# Patient Record
Sex: Male | Born: 1950
Health system: Southern US, Community
[De-identification: ages and names within clinical notes are randomized; demographics above are authoritative.]

## PROBLEM LIST (undated history)

## (undated) ENCOUNTER — Emergency Department (HOSPITAL_COMMUNITY): Payer: Medicare HMO | Source: Home / Self Care

## (undated) DIAGNOSIS — M51369 Other intervertebral disc degeneration, lumbar region without mention of lumbar back pain or lower extremity pain: Secondary | ICD-10-CM

## (undated) DIAGNOSIS — S065XAA Traumatic subdural hemorrhage with loss of consciousness status unknown, initial encounter: Secondary | ICD-10-CM

## (undated) DIAGNOSIS — C61 Malignant neoplasm of prostate: Secondary | ICD-10-CM

## (undated) DIAGNOSIS — L300 Nummular dermatitis: Secondary | ICD-10-CM

## (undated) DIAGNOSIS — S065X9A Traumatic subdural hemorrhage with loss of consciousness of unspecified duration, initial encounter: Secondary | ICD-10-CM

## (undated) DIAGNOSIS — C801 Malignant (primary) neoplasm, unspecified: Secondary | ICD-10-CM

## (undated) DIAGNOSIS — E663 Overweight: Secondary | ICD-10-CM

## (undated) DIAGNOSIS — R55 Syncope and collapse: Secondary | ICD-10-CM

## (undated) DIAGNOSIS — M5136 Other intervertebral disc degeneration, lumbar region: Secondary | ICD-10-CM

## (undated) DIAGNOSIS — Z95810 Presence of automatic (implantable) cardiac defibrillator: Secondary | ICD-10-CM

## (undated) DIAGNOSIS — E785 Hyperlipidemia, unspecified: Secondary | ICD-10-CM

## (undated) DIAGNOSIS — R569 Unspecified convulsions: Secondary | ICD-10-CM

## (undated) HISTORY — PX: VASECTOMY: SHX75

## (undated) HISTORY — DX: Syncope and collapse: R55

## (undated) HISTORY — DX: Other intervertebral disc degeneration, lumbar region without mention of lumbar back pain or lower extremity pain: M51.369

## (undated) HISTORY — DX: Nummular dermatitis: L30.0

## (undated) HISTORY — PX: NASAL SEPTUM SURGERY: SHX37

## (undated) HISTORY — PX: COLONOSCOPY: SHX174

## (undated) HISTORY — PX: OTHER SURGICAL HISTORY: SHX169

## (undated) HISTORY — DX: Hyperlipidemia, unspecified: E78.5

## (undated) HISTORY — DX: Other intervertebral disc degeneration, lumbar region: M51.36

---

## 2000-11-10 ENCOUNTER — Encounter: Admission: RE | Admit: 2000-11-10 | Discharge: 2000-11-10 | Payer: Self-pay | Admitting: Otolaryngology

## 2000-11-10 ENCOUNTER — Encounter: Payer: Self-pay | Admitting: Otolaryngology

## 2000-11-30 ENCOUNTER — Encounter (INDEPENDENT_AMBULATORY_CARE_PROVIDER_SITE_OTHER): Payer: Self-pay | Admitting: Specialist

## 2000-11-30 ENCOUNTER — Other Ambulatory Visit: Admission: RE | Admit: 2000-11-30 | Discharge: 2000-11-30 | Payer: Self-pay | Admitting: Otolaryngology

## 2003-02-03 ENCOUNTER — Ambulatory Visit (HOSPITAL_COMMUNITY): Admission: RE | Admit: 2003-02-03 | Discharge: 2003-02-03 | Payer: Self-pay | Admitting: Internal Medicine

## 2003-03-13 ENCOUNTER — Inpatient Hospital Stay (HOSPITAL_COMMUNITY): Admission: RE | Admit: 2003-03-13 | Discharge: 2003-03-15 | Payer: Self-pay | Admitting: Neurosurgery

## 2004-02-19 ENCOUNTER — Ambulatory Visit (HOSPITAL_COMMUNITY): Admission: RE | Admit: 2004-02-19 | Discharge: 2004-02-19 | Payer: Self-pay | Admitting: Gastroenterology

## 2004-02-19 ENCOUNTER — Encounter (INDEPENDENT_AMBULATORY_CARE_PROVIDER_SITE_OTHER): Payer: Self-pay | Admitting: Specialist

## 2008-03-14 ENCOUNTER — Encounter: Admission: RE | Admit: 2008-03-14 | Discharge: 2008-03-14 | Payer: Self-pay | Admitting: Otolaryngology

## 2010-07-02 NOTE — H&P (Signed)
NAME:  Anthony Banks, Anthony Banks                          ACCOUNT NO.:  [id number]   MEDICAL RECORD NO.:  [id number]                   PATIENT TYPE:  INP   LOCATION:  3172                                 FACILITY:  [facility]   PHYSICIAN:  Hewitt Shorts, M.D.            DATE OF BIRTH:  06/06/1950   DATE OF ADMISSION:  03/13/2003  DATE OF DISCHARGE:                                HISTORY & PHYSICAL   HISTORY OF PRESENT ILLNESS:  The patient is a 60 year old right-handed white  male whom I evaluated one month ago for lumbar radiculopathy secondary to  lumbar disk herniation.  His difficulties began two months ago.  He was in a  golf tournament and developed some burning in his low back that went over  his buttocks.  He was at a wedding in early December and dancing and other  activities aggravated the pain.  He developed shooting pain into the medial  aspect of his right thigh.  He was evaluated with x-rays and MRI scan, and a  neurosurgical consultation was requested.  When he was evaluated one month  ago, his primary complaint was that of left buttock and posterior thigh pain  which was aggravated by coughing and sneezing, as well as some morning  stiffness.  He did not described any numbness, paresthesia, or weakness.   PAST MEDICAL HISTORY:  He does not describe any history of hypertension,  myocardial infarction, cancer, stroke, diabetes, cardiovascular disease, or  lung disease.  Surgeries include surgery for a deviated septum in 2003 and  blepharoplasty five years ago.   He reported an allergy to ASPIRIN which causes hives and chest pain.   Current medication includes Lodine b.i.d.   FAMILY HISTORY:  His mother is age 11 and in good health.  His father is age  65 and in good health.   SOCIAL HISTORY:  The patient is an Surveyor, minerals.  He is married.  He does not smoke.  He does drink alcoholic beverages socially.  He denies  history of substance abuse.   REVIEW OF SYSTEMS:   Notable for those in the history of present illness and  past medical history but is otherwise unremarkable.   PHYSICAL EXAMINATION:  GENERAL:  The patient is a well-developed, well-  nourished white male in no acute distress.  VITAL SIGNS:  Temperature 97.3, pulse 67, blood pressure 134/86, respiratory  rate 16.  Height 6 foot 7 inches, weight 237 pounds.  LUNGS:  Clear to auscultation with symmetrical respiratory excursion.  HEART:  Regular rate and rhythm, S1 and S2, no murmur.  ABDOMEN:  Soft, nondistended.  Bowel sounds are present.  EXTREMITIES:  No clubbing, cyanosis, or edema.  MUSCULOSKELETAL:  Examination shows no tenderness to palpation over the  lumbar spinous processes or paralumbar musculature.  NEUROLOGIC:  Examination shows 5/5 strength in the lower extremities  including iliopsoas, quadriceps, dorsal flexors, plantar flexors and  __________  bilaterally.  Sensation is intact to pinprick in the distal  lower extremities.  Reflexes are minimal in the biceps and in the right  gastrocnemius is 1-2.  Toes on downgoing bilaterally.  He has a normal gait  and stance.   DIAGNOSTIC STUDIES:  X-rays show multilevel degenerative disk disease and  spondylosis with disk space narrowing L3-4, L4-5, and L5-S1.  There is no  significant spurring and grade 1 retrolisthesis at L3 and L4 which is stable  with flexion and extension.  Oblique films showed moderate facet arthropathy  bilaterally in multiple levels.   MRI shows multilevel degenerative disk disease and spondylosis.  There is  marked disk space narrowing at L4-L5 and L5-S1 associated with foraminal  encroachments due to loss of disk space height, but no significant disk  protrusion at those levels.  At L4-5 there is mild stenosis.  No stenosis at  the L5-S1 level.  At L1-2 and L2-3, there is degenerative disk disease and  again mild stenosis of L2-3 and no stenosis at L1-2.   The most significant finding is the degenerative  changes seen at the L3-4  level with a grade 1 retrolisthesis at L3 and 4, significant disk bulging  ventrally and dorsally at L3-4 with a large disk herniation for fragments  extending bilaterally at L3-4, worse to the right than to the left.  Another  significant finding is that of stenosis at the L3-4 level due to a  combination of retrolisthesis, degenerative disk protrusion and disk  herniation.   IMPRESSION:  A patient with back pain and lumbar radiculopathy, at time  bilaterally and other times only to the right, currently only to the left.  He is neurologically intact.  He has multilevel degenerative disk disease  and spondylosis with a large disk herniation at L3-4 superimposed upon  significant degenerative disk protrusion with resulting marked, severe  stenosis at the L3-4 level.  There is retrolisthesis at L3 on L4.   PLAN:  We have discussed a number of options which include symptomatic  treatment versus surgical intervention and elected to proceed with L4 Gill  procedure, L3 laminectomy, L3-4 posterior lumbar interbody fusion and  interbody PEEK cages and bone graft and posterolateral arthrodesis with  posterior instrumentation and bone graft.  I have discussed the nature of  this procedure as well as other procedures, as well as the alternative of  continued symptomatic treatment with the patient.  We discussed the nature  of the procedures, __________  , hospital standard __________  , surgery and  risks of surgery including the risk of infection, bleeding, possibility of  transfusion, the risk of nerve dysfunction and pain, weakness, or  paresthesias, the risk of dural tear and CSF leak, possible need for further  surgery, the risk of failure of the arthrodesis and possible need for  further surgery, and as to the myocardial infarction, stroke, pneumonia, and death . Understanding all this, he does wish to go ahead with surgery and is  admitted for such.                                                 Hewitt Shorts, M.D.    RWN/MEDQ  D:  03/13/2003  T:  03/13/2003  Job:  [id number]

## 2010-07-02 NOTE — Op Note (Signed)
NAME:  Anthony Banks, Anthony Banks                          ACCOUNT NO.:  [id number]   MEDICAL RECORD NO.:  [id number]                   PATIENT TYPE:  INP   LOCATION:  3012                                 FACILITY:  [facility]   PHYSICIAN:  Hewitt Shorts, M.D.            DATE OF BIRTH:  Feb 05, 1951   DATE OF PROCEDURE:  03/13/2003  DATE OF DISCHARGE:                                 OPERATIVE REPORT   PREOPERATIVE DIAGNOSIS:  L3-L4 lumbar disc herniation, lumbar stenosis,  lumbar spondylosis, and lumbar degenerative disc disease.   POSTOPERATIVE DIAGNOSIS:  L3-L4 lumbar disc herniation, lumbar stenosis,  lumbar spondylosis, and lumbar degenerative disc disease.   PROCEDURE:  L3 Gill procedure, L3-L4 posterior lumbar interbody fusion with  Globus PEEK implants with VTOS and bone marrow aspirate and L3-L4  posterolateral arthrodesis with 90D instrumentation, VTOS with bone marrow  aspirate, and locally harvested morselized autograft with microdissection.   SURGEON:  Hewitt Shorts, M.D.   ASSISTANT:  Clydene Fake, M.D.   ANESTHESIA:  General endotracheal anesthesia.   INDICATIONS FOR PROCEDURE:  This is a 60 year old man who presented with low  back pain and bilateral lumbar radiculopathy who was found to have a  degenerative retrolisthesis L3 on L4 with degenerative disc disease and  spondylosis at the L3-L4 level and with a bilobed L3-L4 disc herniation that  extended caudally behind the body of L4.  There was severe stenosis at the  level and the decision was made to proceed with decompression and  arthrodesis.   DESCRIPTION OF PROCEDURE:  The patient was brought to the operating room and  placed under general endotracheal anesthesia.  The patient was turned to the  prone position and the lumbar region was prepped with Betadine soaping  solution and draped in a sterile fashion.  The midline was infiltrated with  local anesthetic with epinephrine.  An x-ray was taken and the L3-L4  level  identified and a midline incision was made over the L3-L4 level, the line of  the incision was infiltrated with local anesthetic with epinephrine.  The  dissection was carried down through the subcutaneous tissue.  Bipolar  cautery and electrocautery were used to maintain hemostasis.  Dissection was  carried down to the lumbar fascia which was incised bilaterally.  The  paraspinal muscles were dissected from the spinous process and lamina in a  subperiosteal fashion.  The L3-L4 interlaminar space was identified and an x-  ray was taken to confirm the localization.  We proceeded with L3 Gill  procedure removing the spinous processes, lamina, and inferior articular  processes of L3 at the level of the pars intra-articularis.  A superior L4  laminotomy was performed bilaterally.  The microscope was then draped and  brought onto the field to provide magnification, illumination, and  visualization, and the remainder of the decompression was performed using  microdissection and microsurgical technique.  The ligamentum flavum was  carefully removed and we were able to identify the L3 and L4 nerve roots  bilaterally and these structures were decompressed.  We then examined the  epidural space.  There was a large broad based disc herniation at the L3-L4  level, however, caudal to the actual disc space level, we encountered free  fragment and disc herniation within the ventral epidural space along the  superior aspect of the L4 vertebral body.  This was carefully dissected from  the surrounding epidural tissues and removed.  We then entered into the disc  space bilaterally incising the annulus and using pituitary rongeurs and a  variety of microcurets, proceeded with a thorough discectomy bilaterally.  The cartilaginous endplates of the vertebral body surface were cleared of  the cartilaginous tissue to expose bony surface for the preparation of the  arthrodesis.  Once the decompression was  completed, we used gentle  distractor to open the disc space slightly and we sized an 11 mm interbody  implant.  We then probed the pedicles of L4 bilaterally and aspirated bone  marrow aspirate which was injected over two 10 mL chips of VTOS.  The VTOS  was then packed into the 11 mm PEEK interbody cages and then the interbody  spacers were removed and the interbody implants packed with VTOS with bone  marrow aspirate and gently positioned into the intervertebral disc space  bilaterally.  We then took additional VTOS with bone marrow aspirate and  packed it lateral to the interbody implants.  It was tapped into position  and densely packed.   We then proceeded with the posterolateral arthrodesis.  C-arm fluoroscopic  unit was brought onto the field.  We identified the pedicle entry sites for  L3 bilaterally.  The overlying cortex was opened with aw [facility] Max drill and  with C-arm fluoroscopic guidance, the pedicles at L3 were probed.  We then  examined each of the four pedicles with a ball probe.  No cut outs were  found.  All four pedicles were then tapped with a 5.25 tap.  Again, it was  examined with a ball probe and, again, no cut outs were found and good  threading was noted.  We placed 6.75 by 50 mm screws bilaterally at L3 and  L4, again, with C-arm fluoroscopic guidance.  Once all four pedicle screws  were in position, we selected 40 mm rods which were placed on the screw  heads and locking caps were placed and then tightened against the counter  torque bilaterally.  We then selected a medium size crosslink which was  secured to the rods bilaterally and tightened and a solid construct had been  created.  We then had, earlier in the exposure, exposed the transverse  processes of L3 and L4 bilaterally and decorticated their surfaces.  We took  additional VTOS with bone marrow aspirate and packed it over the transverse processes and inter-transverse gutter and took additional locally  harvested  morselized autograft and packed it over the VTOS.  The C-arm fluoroscopic  unit showed good positioning of the pedicle screws from the lateral  trajectory as well as good trapezoidal trajectories on AP view.  Once the  bone graft had been placed laterally, we checked again to insure that none  of the bone graft was against the thecal sac or nerve roots.  Then, we  proceeded with closure.  The wound had been extensively irrigated throughout  the procedure with saline solution as well as Bacitracin solution.  The  paraspinal muscles were approximated with interrupted undyed #1 Vicryl  suture, the deep fascia was closed with interrupted undyed #1 Vicryl suture,  the subcutaneous and subcuticular layer was closed with interrupted inverted  2-0 Vicryl sutures, and the skin was reapproximated with Dermabond.  The  patient tolerated the procedure well.  Estimated blood loss was 600 mL.  We  were able to give back to the patient 200 mL of Cell Saver blood.  Sponge  and needle counts were correct.  Following surgery, the patient was turned  back to the supine position, reversed from the anesthetic, extubated, and  transferred to the recovery room for further care.                                               Hewitt Shorts, M.D.    RWN/MEDQ  D:  03/13/2003  T:  03/13/2003  Job:  [id number]

## 2010-07-02 NOTE — Op Note (Signed)
NAME:  ELIZA, GRISSINGER                ACCOUNT NO.:  [id number]   MEDICAL RECORD NO.:  [id number]          PATIENT TYPE:  AMB   LOCATION:  ENDO                         FACILITY:  [facility]   PHYSICIAN:  Danise Edge, M.D.   DATE OF BIRTH:  18-Jan-1951   DATE OF PROCEDURE:  02/19/2004  DATE OF DISCHARGE:                                 OPERATIVE REPORT   PROCEDURE PERFORMED:  Colonoscopy and sigmoid polypectomy.   PROCEDURE INDICATION:  Mr. Ori Trejos is a 60 year old male, born  1950-10-18.  Mr. Tomaro is scheduled to undergo his first screening  colonoscopy with polypectomy to prevent colon cancer.   ENDOSCOPIST:  Danise Edge, M.D.   PREMEDICATION:  Versed 7.5 mg, Demerol 70 mg.   PROCEDURE:  After obtaining informed consent, Mr. Woods was placed in the  left lateral decubitus position.  I administered intravenous Demerol and  intravenous Versed to achieve conscious sedation for the procedure.  The  patient's blood pressure, oxygen saturation, and cardiac rhythm were  monitored throughout the procedure and documented in the medical record.   Anal inspection and digital rectal exam were normal.  The prostate was non-  nodular.  The  Olympus adjustable pediatric colonoscope was introduced into  the rectum and advanced to the cecum.  Colonic preparation for the exam  today was excellent.   Rectum:  Normal.   Sigmoid colon and descending colon:  From the distal sigmoid colon at 20 cm  from the anal verge, a 2-mm sessile polyp was removed with the  electrocautery snare.   Splenic flexure:  Normal.   Transverse colon:  Normal.   Hepatic flexure:  Normal.   Ascending colon:  Normal.   Cecum and ileocecal valve:  Normal.   ASSESSMENT:  A small polyp was removed from the distal sigmoid colon;  otherwise normal screening proctocolonoscopy to the cecum.      MJ/MEDQ  D:  02/19/2004  T:  02/19/2004  Job:  [id number]   cc:   Thora Lance, M.D.  [address]  [place]  [place] [address]  Fax: [phone]

## 2011-03-20 ENCOUNTER — Emergency Department (HOSPITAL_COMMUNITY)
Admission: EM | Admit: 2011-03-20 | Discharge: 2011-03-20 | Disposition: A | Discharge status: Home / Self Care | Payer: 59 | Attending: Emergency Medicine | Admitting: Emergency Medicine

## 2011-03-20 ENCOUNTER — Encounter (HOSPITAL_COMMUNITY): Payer: Self-pay

## 2011-03-20 DIAGNOSIS — S025XXA Fracture of tooth (traumatic), initial encounter for closed fracture: Secondary | ICD-10-CM | POA: Insufficient documentation

## 2011-03-20 DIAGNOSIS — S0100XA Unspecified open wound of scalp, initial encounter: Secondary | ICD-10-CM | POA: Insufficient documentation

## 2011-03-20 DIAGNOSIS — IMO0002 Reserved for concepts with insufficient information to code with codable children: Secondary | ICD-10-CM | POA: Insufficient documentation

## 2011-03-20 DIAGNOSIS — S0101XA Laceration without foreign body of scalp, initial encounter: Secondary | ICD-10-CM

## 2011-03-20 NOTE — ED Provider Notes (Signed)
History     CSN: [id number]  Arrival date & time 03/20/11  1504   First MD Initiated Contact with Patient 03/20/11 1539      Chief Complaint  Patient presents with  . Head Laceration     Patient is a 61 y.o. male presenting with scalp laceration. The history is provided by the patient.  Head Laceration This is a new problem. The current episode started less than 1 hour ago. The problem occurs constantly. The problem has not changed since onset.Pertinent negatives include no chest pain, no abdominal pain, no headaches and no shortness of breath. The symptoms are aggravated by nothing. The symptoms are relieved by nothing.  pt was helping friend move a tree and part of the tree fell as he was moving the tree and hit him on the top of his head.   He was not crushed by tree.   No LOC No headache No falls No neck/back pain No focal weakness/dizziness No cp/sob/abd pain Reports he may have "Cracked" one of his teeth biting down No visual changes Tetanus UTD  PMH - none  History reviewed. No pertinent past surgical history.  History reviewed. No pertinent family history.  History  Substance Use Topics  . Smoking status: Never Smoker   . Smokeless tobacco: Not on file  . Alcohol Use: Yes      Review of Systems  Respiratory: Negative for shortness of breath.   Cardiovascular: Negative for chest pain.  Gastrointestinal: Negative for abdominal pain.  Neurological: Negative for headaches.  All other systems reviewed and are negative.    Allergies  Aspirin  Home Medications  No current outpatient prescriptions on file.  BP 155/98  Pulse 82  Temp(Src) 98.4 F (36.9 C) (Oral)  Resp 20  SpO2 96%  Physical Exam CONSTITUTIONAL: Well developed/well nourished HEAD AND FACE: 4cm laceration to top of head, it is linear and extends vertically.  Bleeding controlled The periosteum is not exposed. Galeal intact EYES: EOMI/PERRL ENMT: Mucous membranes moist. Small ellis  class 1 fx of left upper central incisor.  No other dental injury.  No malocclusion.  No trismus.  Midface stable.  No nasal deformity/tenderness. No septa hematoma.   NECK: supple no meningeal signs SPINE:entire spine nontender, NEXUS criteria met, No bruising/crepitance/stepoffs noted to spine CV: S1/S2 noted, no murmurs/rubs/gallops noted LUNGS: Lungs are clear to auscultation bilaterally, no apparent distress ABDOMEN: soft, nontender, no rebound or guarding GU:no cva tenderness NEURO: Pt is awake/alert, moves all extremitiesx4, GCS 15 EXTREMITIES: pulses normal, full ROM SKIN: warm, color normal PSYCH: no abnormalities of mood noted  ED Course  Procedures   LACERATION REPAIR Performed by: Joya Gaskins Consent: Verbal consent obtained. Risks and benefits: risks, benefits and alternatives were discussed Patient identity confirmed: provided demographic data  Prepped and Draped in normal sterile fashion Wound explored  Laceration Location: scalp  Laceration Length: 4cm  No Foreign Bodies seen or palpated  Anesthesia: local infiltration  Patient did not want anesthesia, this was offered to him  Patient was cleansed extensively by nursing with sterile water and peroxide   Number of sutures or staples: 9 staples  Technique: staple  Patient tolerance: Patient tolerated the procedure well with no immediate complications.    1. Scalp laceration       MDM  Nursing notes reviewed and considered in documentation Pt did not want pain meds or anesthesia, this was offered Wound explored, no foreign body, no tenderness, no crepitance, periosteum not exposed but it was a  deep laceration Discussed at length wound infection precautions Also - discussed at length signs/symptoms of head injury He refused CT imaging of brain, and I discussed risks of refusing CT imaging He reports he will return for any headache/vomitng/dizziness/visual changes        Joya Gaskins, MD 03/20/11 1818

## 2011-03-20 NOTE — ED Notes (Signed)
Pt working in yard helping neighbor push down a tree, tree snapped hitting pt in the head, bleeding controlled arrival, denies blood thinners

## 2011-03-25 ENCOUNTER — Encounter (HOSPITAL_COMMUNITY): Payer: Self-pay | Admitting: Emergency Medicine

## 2011-03-25 ENCOUNTER — Emergency Department (HOSPITAL_COMMUNITY)
Admission: EM | Admit: 2011-03-25 | Discharge: 2011-03-25 | Disposition: A | Discharge status: Home / Self Care | Payer: 59 | Attending: Emergency Medicine | Admitting: Emergency Medicine

## 2011-03-25 DIAGNOSIS — X58XXXA Exposure to other specified factors, initial encounter: Secondary | ICD-10-CM | POA: Insufficient documentation

## 2011-03-25 DIAGNOSIS — Z4802 Encounter for removal of sutures: Secondary | ICD-10-CM | POA: Insufficient documentation

## 2011-03-25 DIAGNOSIS — S0100XA Unspecified open wound of scalp, initial encounter: Secondary | ICD-10-CM | POA: Insufficient documentation

## 2011-03-25 NOTE — ED Notes (Addendum)
Patient here for suture removal.  9 staples on top of head.  PA at bedside.  Staples removed, minimal bleeding.  Laceration is healing well, well approximated, no signs of infection.

## 2011-03-25 NOTE — ED Provider Notes (Signed)
History     CSN: [id number]  Arrival date & time 03/25/11  0608   None     Chief Complaint  Patient presents with  . Suture / Staple Removal    (Consider location/radiation/quality/duration/timing/severity/associated sxs/prior treatment) HPI Comments: Mr. Macmaster reports to the ED for suture removal from a laceration on the top of his head which was stapled on 03/20/11. He denies fever. Denies drainage from wound.   Patient is a 61 y.o. male presenting with suture removal. The history is provided by the patient.  Suture / Staple Removal  The sutures were placed 3 to 6 days ago. Treatments since wound repair include regular soap and water washings. There has been no drainage from the wound. There is no redness present. There is no swelling present. The pain has no pain.    History reviewed. No pertinent past medical history.  History reviewed. No pertinent past surgical history.  History reviewed. No pertinent family history.  History  Substance Use Topics  . Smoking status: Never Smoker   . Smokeless tobacco: Not on file  . Alcohol Use: Yes      Review of Systems  Constitutional: Negative for fever.  Skin: Negative for rash.    Allergies  Aspirin  Home Medications  No current outpatient prescriptions on file.  BP 142/95  Pulse 77  Temp(Src) 98.1 F (36.7 C) (Oral)  Resp 20  SpO2 95%  Physical Exam  Constitutional: He appears well-developed and well-nourished. No distress.  HENT:  Head: Normocephalic. Head is with laceration (Edges are approximated. Dried blood along length of incision site. Small bleeding from middle of laceration after staple removal. There is a 1cm region along laceration line which appears unstable. ). Head contusion: purple bruising over left eyelid.    Skin: Skin is warm and dry. He is not diaphoretic.    ED Course  Procedures (including critical care time) Staples were removed by nursing staff prior to evaluation. Wound initially  appeared to be stable, however, upon inspection of site after staple removal, the wound appeared unstable without dehiscence. Discussed reapplication of 2-3 staples for reinforcement and continued healing, but patient declined. He was amenable to dermabond and this was applied to the length of the wound to allow for complete wound healing. Labs Reviewed - No data to display No results found.   No diagnosis found.    MDM          Rodena Medin, PA-C 03/25/11 [phone]

## 2011-03-25 NOTE — ED Notes (Signed)
Patient would like his staples removed today from his head.  Healing laceration on top of head.

## 2011-03-26 NOTE — ED Provider Notes (Signed)
Pt had head injury with laceation repaired with staples on 03/20/11.  Staples taken out at pt request by nursing on arrival however wound still appears in early stages of healing - pt declined re-stapling but amenable to dermabond.  Otherwise denies fevers, discharge, redness or pain unless palpated.  Healing well per pt.   Gait normal, speech normal, skin with healing wound edges that are well approximated after staples removed by RN.  PA to place dermabond - otherwise stable for d/c.  Medical screening examination/treatment/procedure(s) were conducted as a shared visit with non-physician practitioner(s) and myself.  I personally evaluated the patient during the encounter   Vida Roller, MD 03/26/11 [phone]

## 2012-04-16 ENCOUNTER — Other Ambulatory Visit: Payer: Self-pay | Admitting: Dermatology

## 2012-12-04 ENCOUNTER — Other Ambulatory Visit: Payer: Self-pay | Admitting: Dermatology

## 2013-02-18 ENCOUNTER — Ambulatory Visit: Payer: 59 | Attending: Internal Medicine | Admitting: Physical Therapy

## 2013-02-18 DIAGNOSIS — M545 Low back pain, unspecified: Secondary | ICD-10-CM | POA: Insufficient documentation

## 2013-02-18 DIAGNOSIS — IMO0001 Reserved for inherently not codable concepts without codable children: Secondary | ICD-10-CM | POA: Insufficient documentation

## 2013-02-18 DIAGNOSIS — M2560 Stiffness of unspecified joint, not elsewhere classified: Secondary | ICD-10-CM | POA: Insufficient documentation

## 2013-02-18 DIAGNOSIS — R293 Abnormal posture: Secondary | ICD-10-CM | POA: Insufficient documentation

## 2013-02-25 ENCOUNTER — Ambulatory Visit: Payer: 59 | Admitting: Physical Therapy

## 2013-02-27 ENCOUNTER — Encounter: Payer: 59 | Admitting: Physical Therapy

## 2013-03-04 ENCOUNTER — Encounter: Payer: 59 | Admitting: Physical Therapy

## 2013-03-06 ENCOUNTER — Encounter: Payer: 59 | Admitting: Physical Therapy

## 2013-03-14 ENCOUNTER — Other Ambulatory Visit: Payer: Self-pay | Admitting: Internal Medicine

## 2013-03-14 ENCOUNTER — Ambulatory Visit
Admission: RE | Admit: 2013-03-14 | Discharge: 2013-03-14 | Disposition: A | Discharge status: Home / Self Care | Payer: 59 | Source: Ambulatory Visit | Attending: Internal Medicine | Admitting: Internal Medicine

## 2013-03-14 DIAGNOSIS — M545 Low back pain, unspecified: Secondary | ICD-10-CM

## 2013-09-04 ENCOUNTER — Encounter: Payer: Self-pay | Admitting: *Deleted

## 2013-12-03 ENCOUNTER — Other Ambulatory Visit: Payer: Self-pay | Admitting: Dermatology

## 2014-05-26 ENCOUNTER — Other Ambulatory Visit: Payer: Self-pay | Admitting: Dermatology

## 2014-08-27 ENCOUNTER — Other Ambulatory Visit: Payer: Self-pay | Admitting: Gastroenterology

## 2014-11-06 ENCOUNTER — Encounter (HOSPITAL_COMMUNITY): Payer: Self-pay | Admitting: *Deleted

## 2014-11-18 ENCOUNTER — Other Ambulatory Visit: Payer: Self-pay | Admitting: Gastroenterology

## 2014-11-23 NOTE — Anesthesia Preprocedure Evaluation (Addendum)
Anesthesia Evaluation  Patient identified by MRN, date of birth, ID band Patient awake    Reviewed: Allergy & Precautions, H&P , NPO status , Patient's Chart, lab work & pertinent test results  Airway Mallampati: III  TM Distance: >3 FB Neck ROM: Full    Dental no notable dental hx. (+) Teeth Intact, Dental Advisory Given   Pulmonary neg pulmonary ROS,    Pulmonary exam normal breath sounds clear to auscultation       Cardiovascular negative cardio ROS   Rhythm:Regular Rate:Normal     Neuro/Psych negative neurological ROS  negative psych ROS   GI/Hepatic negative GI ROS, Neg liver ROS,   Endo/Other  negative endocrine ROS  Renal/GU negative Renal ROS  negative genitourinary   Musculoskeletal  (+) Arthritis , Osteoarthritis,    Abdominal   Peds  Hematology negative hematology ROS (+)   Anesthesia Other Findings   Reproductive/Obstetrics negative OB ROS                            Anesthesia Physical Anesthesia Plan  ASA: II  Anesthesia Plan: MAC   Post-op Pain Management:    Induction: Intravenous  Airway Management Planned: Simple Face Mask  Additional Equipment:   Intra-op Plan:   Post-operative Plan:   Informed Consent: I have reviewed the patients History and Physical, chart, labs and discussed the procedure including the risks, benefits and alternatives for the proposed anesthesia with the patient or authorized representative who has indicated his/her understanding and acceptance.   Dental advisory given  Plan Discussed with: CRNA  Anesthesia Plan Comments:         Anesthesia Quick Evaluation

## 2014-11-24 ENCOUNTER — Ambulatory Visit (HOSPITAL_COMMUNITY)
Admission: RE | Admit: 2014-11-24 | Discharge: 2014-11-24 | Disposition: A | Discharge status: Home / Self Care | Payer: 59 | Source: Ambulatory Visit | Attending: Gastroenterology | Admitting: Gastroenterology

## 2014-11-24 ENCOUNTER — Ambulatory Visit (HOSPITAL_COMMUNITY): Payer: 59 | Admitting: Anesthesiology

## 2014-11-24 ENCOUNTER — Encounter (HOSPITAL_COMMUNITY): Payer: Self-pay

## 2014-11-24 ENCOUNTER — Encounter (HOSPITAL_COMMUNITY)
Admission: RE | Disposition: A | Discharge status: Home / Self Care | Payer: Self-pay | Source: Ambulatory Visit | Attending: Gastroenterology

## 2014-11-24 DIAGNOSIS — M1990 Unspecified osteoarthritis, unspecified site: Secondary | ICD-10-CM | POA: Insufficient documentation

## 2014-11-24 DIAGNOSIS — Z1211 Encounter for screening for malignant neoplasm of colon: Secondary | ICD-10-CM | POA: Diagnosis present

## 2014-11-24 DIAGNOSIS — K5730 Diverticulosis of large intestine without perforation or abscess without bleeding: Secondary | ICD-10-CM | POA: Insufficient documentation

## 2014-11-24 DIAGNOSIS — D122 Benign neoplasm of ascending colon: Secondary | ICD-10-CM | POA: Insufficient documentation

## 2014-11-24 DIAGNOSIS — K621 Rectal polyp: Secondary | ICD-10-CM | POA: Diagnosis not present

## 2014-11-24 DIAGNOSIS — D128 Benign neoplasm of rectum: Secondary | ICD-10-CM | POA: Diagnosis not present

## 2014-11-24 HISTORY — DX: Malignant (primary) neoplasm, unspecified: C80.1

## 2014-11-24 HISTORY — PX: COLONOSCOPY WITH PROPOFOL: SHX5780

## 2014-11-24 SURGERY — COLONOSCOPY WITH PROPOFOL
Anesthesia: Monitor Anesthesia Care

## 2014-11-24 MED ORDER — PROPOFOL 10 MG/ML IV BOLUS
INTRAVENOUS | Status: DC | PRN
  Administered 2014-11-24 (×3): 100 mg via INTRAVENOUS
  Administered 2014-11-24: 50 mg via INTRAVENOUS

## 2014-11-24 MED ORDER — SODIUM CHLORIDE 0.9 % IV SOLN: INTRAVENOUS | Status: DC

## 2014-11-24 MED ORDER — PROPOFOL 10 MG/ML IV BOLUS
INTRAVENOUS | Status: AC
  Filled 2014-11-24: qty 20

## 2014-11-24 MED ORDER — LIDOCAINE HCL (PF) 2 % IJ SOLN
INTRAMUSCULAR | Status: DC | PRN
  Administered 2014-11-24: 20 mg via INTRADERMAL

## 2014-11-24 MED ORDER — LACTATED RINGERS IV SOLN
INTRAVENOUS | Status: DC
  Administered 2014-11-24: 12:00:00 via INTRAVENOUS

## 2014-11-24 MED ORDER — LIDOCAINE HCL (CARDIAC) 20 MG/ML IV SOLN
INTRAVENOUS | Status: AC
  Filled 2014-11-24: qty 5

## 2014-11-24 SURGICAL SUPPLY — 22 items

## 2014-11-24 NOTE — Op Note (Signed)
Procedure: Screening colonoscopy. Normal screening colonoscopy performed on 02/19/2004  Endoscopist: Earle Gell  Premedication: Propofol administered by anesthesia  Procedure: The patient was placed in the left lateral decubitus position. Anal inspection and digital rectal exam were normal. The Pentax pediatric colonoscope was introduced into the rectum and advanced to the cecum. A normal-appearing appendiceal orifice and ileocecal valve were identified. Colonic preparation for the exam today was good. Withdrawal time was 11 minutes  Rectum. From the mid rectum, a 5 mm sessile polyp was removed with the cold snare and a 3 mm sessile polyp was removed with the cold biopsy forceps. Retroflexed view of the distal rectum was normal  Sigmoid colon and descending colon. Left colonic diverticulosis  Splenic flexure. Normal  Transverse colon. Normal  Hepatic flexure. Normal  Ascending colon. From the mid ascending colon, a 5 mm sessile polyp was removed with the cold snare  Cecum and ileocecal valve. Normal.  Assessment: A small polyp was removed from the mid ascending colon and two small polyps were removed from the rectum. Otherwise normal colonoscopy.  Recommendation: I will review the polyp pathology to determine when the patient should undergo a repeat colonoscopy

## 2014-11-24 NOTE — Discharge Instructions (Signed)
Colonoscopy, Care After °Refer to this sheet in the next few weeks. These instructions provide you with information on caring for yourself after your procedure. Your health care provider may also give you more specific instructions. Your treatment has been planned according to current medical practices, but problems sometimes occur. Call your health care provider if you have any problems or questions after your procedure. °WHAT TO EXPECT AFTER THE PROCEDURE  °After your procedure, it is typical to have the following: °· A small amount of blood in your stool. °· Moderate amounts of gas and mild abdominal cramping or bloating. °HOME CARE INSTRUCTIONS °· Do not drive, operate machinery, or sign important documents for 24 hours. °· You may shower and resume your regular physical activities, but move at a slower pace for the first 24 hours. °· Take frequent rest periods for the first 24 hours. °· Walk around or put a warm pack on your abdomen to help reduce abdominal cramping and bloating. °· Drink enough fluids to keep your urine clear or pale yellow. °· You may resume your normal diet as instructed by your health care provider. Avoid heavy or fried foods that are hard to digest. °· Avoid drinking alcohol for 24 hours or as instructed by your health care provider. °· Only take over-the-counter or prescription medicines as directed by your health care provider. °· If a tissue sample (biopsy) was taken during your procedure: °¨ Do not take aspirin or blood thinners for 7 days, or as instructed by your health care provider. °¨ Do not drink alcohol for 7 days, or as instructed by your health care provider. °¨ Eat soft foods for the first 24 hours. °SEEK MEDICAL CARE IF: °You have persistent spotting of blood in your stool 2-3 days after the procedure. °SEEK IMMEDIATE MEDICAL CARE IF: °· You have more than a small spotting of blood in your stool. °· You pass large blood clots in your stool. °· Your abdomen is swollen  (distended). °· You have nausea or vomiting. °· You have a fever. °· You have increasing abdominal pain that is not relieved with medicine. °  °This information is not intended to replace advice given to you by your health care provider. Make sure you discuss any questions you have with your health care provider. °  °Document Released: 09/15/2003 Document Revised: 11/21/2012 Document Reviewed: 10/08/2012 °Elsevier Interactive Patient Education ©2016 [employer] ° °

## 2014-11-24 NOTE — Anesthesia Postprocedure Evaluation (Signed)
  Anesthesia Post-op Note  Patient: Anthony Banks  Procedure(s) Performed: Procedure(s): COLONOSCOPY WITH PROPOFOL (N/A)  Patient Location: PACU  Anesthesia Type: MAC  Level of Consciousness: awake and alert   Airway and Oxygen Therapy: Patient Spontanous Breathing  Post-op Pain: Controlled  Post-op Assessment: Post-op Vital signs reviewed, Patient's Cardiovascular Status Stable and Respiratory Function Stable  Post-op Vital Signs: Reviewed  Filed Vitals:   11/24/14 1350  BP: 134/78  Pulse: 55  Temp:   Resp: 12    Complications: No apparent anesthesia complications

## 2014-11-24 NOTE — H&P (Signed)
  Procedure: Screening colonoscopy. Normal screening colonoscopy performed on 02/19/2004  History: The patient is a 64 year old male born 06-24-1950. He is scheduled to undergo a screening colonoscopy today.  Medication allergies: Aspirin. Morphine.  Past medical history: Nasal polyposis surgery. Lumbar back surgery. Nasal surgery. Eczema. Degenerative disc disease.  Exam: The patient is alert and lying comfortably on the endoscopy stretcher. Abdomen is soft and nontender to palpation. Lungs are clear to auscultation. Cardiac exam reveals a regular rhythm.  Plan: Proceed with repeat screening colonoscopy

## 2014-11-24 NOTE — Transfer of Care (Signed)
Immediate Anesthesia Transfer of Care Note  Patient: Anthony Banks  Procedure(s) Performed: Procedure(s): COLONOSCOPY WITH PROPOFOL (N/A)  Patient Location: PACU  Anesthesia Type:MAC  Level of Consciousness:  sedated, patient cooperative and responds to stimulation  Airway & Oxygen Therapy:Patient Spontanous Breathing   Post-op Assessment:  Report given to PACU RN and Post -op Vital signs reviewed and stable  Post vital signs:  Reviewed and stable  Last Vitals:  Filed Vitals:   11/24/14 1159  BP: 137/89  Pulse: 60  Temp: 37 C  Resp: 11    Complications: No apparent anesthesia complications

## 2014-11-25 ENCOUNTER — Encounter (HOSPITAL_COMMUNITY): Payer: Self-pay | Admitting: Gastroenterology

## 2015-02-15 DIAGNOSIS — Z95810 Presence of automatic (implantable) cardiac defibrillator: Secondary | ICD-10-CM

## 2015-02-15 HISTORY — DX: Presence of automatic (implantable) cardiac defibrillator: Z95.810

## 2015-11-29 ENCOUNTER — Inpatient Hospital Stay (HOSPITAL_COMMUNITY)
Admission: EM | Admit: 2015-11-29 | Discharge: 2015-12-01 | DRG: 244 | Disposition: A | Discharge status: Home / Self Care | Payer: 59 | Attending: Internal Medicine | Admitting: Internal Medicine

## 2015-11-29 ENCOUNTER — Emergency Department (HOSPITAL_COMMUNITY): Payer: 59

## 2015-11-29 ENCOUNTER — Encounter (HOSPITAL_COMMUNITY): Payer: Self-pay

## 2015-11-29 DIAGNOSIS — E663 Overweight: Secondary | ICD-10-CM | POA: Diagnosis present

## 2015-11-29 DIAGNOSIS — Z950 Presence of cardiac pacemaker: Secondary | ICD-10-CM

## 2015-11-29 DIAGNOSIS — I495 Sick sinus syndrome: Secondary | ICD-10-CM | POA: Diagnosis not present

## 2015-11-29 DIAGNOSIS — E7800 Pure hypercholesterolemia, unspecified: Secondary | ICD-10-CM | POA: Diagnosis not present

## 2015-11-29 DIAGNOSIS — R001 Bradycardia, unspecified: Secondary | ICD-10-CM | POA: Diagnosis not present

## 2015-11-29 DIAGNOSIS — I4519 Other right bundle-branch block: Secondary | ICD-10-CM | POA: Diagnosis present

## 2015-11-29 DIAGNOSIS — E785 Hyperlipidemia, unspecified: Secondary | ICD-10-CM | POA: Diagnosis present

## 2015-11-29 DIAGNOSIS — M5136 Other intervertebral disc degeneration, lumbar region: Secondary | ICD-10-CM | POA: Diagnosis present

## 2015-11-29 DIAGNOSIS — Z6827 Body mass index (BMI) 27.0-27.9, adult: Secondary | ICD-10-CM

## 2015-11-29 HISTORY — DX: Overweight: E66.3

## 2015-11-29 LAB — CBC
HCT: 41.6 % (ref 39.0–52.0)
HEMOGLOBIN: 14 g/dL (ref 13.0–17.0)
MCH: 31.5 pg (ref 26.0–34.0)
MCHC: 33.7 g/dL (ref 30.0–36.0)
MCV: 93.5 fL (ref 78.0–100.0)
Platelets: 143 10*3/uL — ABNORMAL LOW (ref 150–400)
RBC: 4.45 MIL/uL (ref 4.22–5.81)
RDW: 12.9 % (ref 11.5–15.5)
WBC: 5.8 10*3/uL (ref 4.0–10.5)

## 2015-11-29 LAB — BASIC METABOLIC PANEL
ANION GAP: 6 (ref 5–15)
BUN: 17 mg/dL (ref 6–20)
CALCIUM: 9.3 mg/dL (ref 8.9–10.3)
CO2: 27 mmol/L (ref 22–32)
Chloride: 105 mmol/L (ref 101–111)
Creatinine, Ser: 1.14 mg/dL (ref 0.61–1.24)
GLUCOSE: 136 mg/dL — AB (ref 65–99)
POTASSIUM: 4.2 mmol/L (ref 3.5–5.1)
SODIUM: 138 mmol/L (ref 135–145)

## 2015-11-29 LAB — I-STAT TROPONIN, ED: TROPONIN I, POC: 0 ng/mL (ref 0.00–0.08)

## 2015-11-29 MED ORDER — MIDAZOLAM HCL 2 MG/2ML IJ SOLN
INTRAMUSCULAR | Status: AC
  Filled 2015-11-29: qty 2

## 2015-11-29 MED ORDER — ONDANSETRON HCL 4 MG/2ML IJ SOLN: 4.0000 mg | Freq: Four times a day (QID) | INTRAMUSCULAR | Status: DC | PRN

## 2015-11-29 MED ORDER — NITROGLYCERIN 0.4 MG SL SUBL: 0.4000 mg | SUBLINGUAL_TABLET | SUBLINGUAL | Status: DC | PRN

## 2015-11-29 MED ORDER — ENOXAPARIN SODIUM 40 MG/0.4ML ~~LOC~~ SOLN: 40.0000 mg | SUBCUTANEOUS | Status: DC

## 2015-11-29 MED ORDER — ACETAMINOPHEN 325 MG PO TABS: 650.0000 mg | ORAL_TABLET | ORAL | Status: DC | PRN

## 2015-11-29 MED ORDER — SODIUM CHLORIDE 0.9% FLUSH
3.0000 mL | Freq: Two times a day (BID) | INTRAVENOUS | Status: DC
  Administered 2015-11-29 – 2015-11-30 (×2): 3 mL via INTRAVENOUS

## 2015-11-29 MED ORDER — SODIUM CHLORIDE 0.9% FLUSH: 3.0000 mL | INTRAVENOUS | Status: DC | PRN

## 2015-11-29 MED ORDER — SODIUM CHLORIDE 0.9 % IV BOLUS (SEPSIS)
1000.0000 mL | Freq: Once | INTRAVENOUS | Status: AC
  Administered 2015-11-29: 1000 mL via INTRAVENOUS

## 2015-11-29 MED ORDER — SODIUM CHLORIDE 0.9 % IV SOLN: 250.0000 mL | INTRAVENOUS | Status: DC | PRN

## 2015-11-29 NOTE — H&P (Signed)
ELECTROPHYSIOLOGY ADMIT NOTE    Primary Care Physician: Irven Shelling, MD Referring Physician:  ED  Admit Date: 11/29/2015  CC: presyncope  Anthony Banks is a 65 y.o. male with no prior cardiac history who presents with symptomatic bradycardia.  The patient reports that he is typically in good health.  He is active without ischemic symptoms.  He plays golf and exercises regularly.  This am, he was working in his driveway when he bent over and became very lightheaded.  He reports associated diaphoresis and presyncope.  He laid down on his driveway due to profound presyncope.  His symptoms resolved after several minutes but recurred 3 different times in waves.  He denies associated CP or SOB.  He called EMS.  While being transported, he felt his symptoms return.  He was found to have profound sinus bradycardia (HR 30s) as the cause and required epicardial pacing while en route to [facility].  His heart rate recovered by the time he arrived to [facility].  He denies syncope.  Presently, he is resting comfortably and is without complaint.  He reports similar symptoms last evening after eating ice cream at home.  He also had similar symptoms with presyncope about 3 years ago for which also had to lay on his driveway at that time. His bradycardia has currently improved. Presently, he denies symptoms of palpitations, chest pain, shortness of breath, orthopnea, PND, lower extremity edema, other or neurologic sequela. The patient is tolerating medications without difficulties and is otherwise without complaint today.   Past Medical History:  Diagnosis Date  . Borderline hyperlipidemia   . Cancer ([place])    basal face Dr [name]  . Lumbar degenerative disc disease   . Nummular eczema   . Overweight   . Syncope    Past Surgical History:  Procedure Laterality Date  . basal/squamous cell face    . bilateral blepharoplasy    . COLONOSCOPY     polyps remove  . COLONOSCOPY WITH PROPOFOL N/A  11/24/2014   Procedure: COLONOSCOPY WITH PROPOFOL;  Surgeon: Garlan Fair, MD;  Location: [facility] ENDOSCOPY;  Service: Endoscopy;  Laterality: N/A;  . L3 Gill procedure with L3 PLIF with interbody implant L3-4 posterlateral asthrodesis and posterior instrumentation of bone graft    . nasal polyps    . NASAL SEPTUM SURGERY    . VASECTOMY         Allergies  Allergen Reactions  . Aspirin Hives and Other (See Comments)    Tight chest  . Morphine Sulfate Swelling  . Nsaids Other (See Comments)    Unknown    Social History   Social History  . Marital status: Married    Spouse name: N/A  . Number of children: N/A  . Years of education: N/A   Occupational History  . Not on file.   Social History Main Topics  . Smoking status: Never Smoker  . Smokeless tobacco: Not on file  . Alcohol use 0.6 - 1.2 oz/week    1 - 2 Cans of beer per week  . Drug use: No  . Sexual activity: Not on file   Other Topics Concern  . Not on file   Social History Narrative   Tobacco Use: Cigarettes: Never Smoked   Non-Smoker for Personal reasons   Alcohol: 1-2 Per week, beer   Exercise: Elliptical 5X a week, wts, body wt   Occupation: Quarry manager   Marital Status: Married    Lives in [place]  Family History  Problem Relation Age of Onset  . Asthma Mother   . Asthma Sister   . Cancer Sister     pancreatic  denies FH of sudden death or arrhythmia.  ROS- All systems are reviewed and negative except as per the HPI above  Physical Exam: Telemetry: sinus rhythm 50s Vitals:   11/29/15 1230 11/29/15 1300 11/29/15 1315 11/29/15 1409  BP: 131/94 146/89 137/87 (!) 143/92  Pulse: (!) 48 (!) 54 (!) 53   Resp: (!) 9 10 (!) 8 10  Temp:    97.5 F (36.4 C)  TempSrc:    Oral  SpO2: 100% 100% 100% 100%  Weight:      Height:        GEN- The patient is overweight but well appearing, alert and oriented x 3 today.   Head- normocephalic, atraumatic Eyes-  Sclera clear,  conjunctiva pink Ears- hearing intact Oropharynx- clear Neck- supple, no JVP Lymph- no cervical lymphadenopathy Lungs- Clear to ausculation bilaterally, normal work of breathing Heart- bradycardic regular rhythm, no murmurs, rubs or gallops, PMI not laterally displaced GI- soft, NT, ND, + BS Extremities- no clubbing, cyanosis, or edema MS- no significant deformity or atrophy Skin- no rash or lesion Psych- euthymic mood, full affect Neuro- strength and sensation are intact  EKG reveals sinus rhythm 51 bpm, first degree AV block, incomplete RBBB  Rhythm strips from EMS are reviewed and confirm sinus bradycardia 30s during the time of his symptoms.   I do not see AV block.  Labs:   Lab Results  Component Value Date   WBC 5.8 11/29/2015   HGB 14.0 11/29/2015   HCT 41.6 11/29/2015   MCV 93.5 11/29/2015   PLT 143 (L) 11/29/2015     Recent Labs Lab 11/29/15 1110  NA 138  K 4.2  CL 105  CO2 27  BUN 17  CREATININE 1.14  CALCIUM 9.3  GLUCOSE 136*    ASSESSMENT AND PLAN:   1.  Sick sinus syndrome The patient has symptomatic sinus bradycardia with presyncope.  I would therefore recommend pacemaker implantation at this time.  Risks, benefits, alternatives to pacemaker implantation were discussed in detail with the patient today.  At this time, he is not sure if he would like to proceed.  He enjoys golf and wishes to wait a few weeks/ months before we proceed. He is willing to be admitted for overnight observation and will further contemplate PPM while here.  Obtain echo and TSH. Ischemic seems unlikely as he has no ischemic symptoms. Check troponin in am  2. Borderline HL Fasting lipids in am   Thompson Grayer, MD 11/29/2015  2:18 PM

## 2015-11-29 NOTE — ED Notes (Signed)
[place] MD at bedside.  Pacing paused.  Pt HR 56

## 2015-11-29 NOTE — ED Provider Notes (Signed)
[facility] DEPT Provider Note   CSN: [id number] Arrival date & time: 11/29/15  1053     History   Chief Complaint Chief Complaint  Patient presents with  . Bradycardia    HPI Anthony Banks is a 65 y.o. male.  HPI Patient presents by EMS for near syncope. States he was working outside today when he became lightheaded after bending over. He lay down briefly with improvement of his symptoms. He then stood up and went to the house. Became lightheaded again and broke out in some sweat. Wife called EMS. Patient noted to be bradycardic with rate in the 50's. He then became bradycardic again with rate in 30s. Symptomatically this. Initiated external pacing. Patient denied chest pain or pressure at any point. Denies shortness of breath, nausea or vomiting. No new lower extremity swelling or pain. Patient does not take medications. No previous diagnosis of cardiac disease. States he woke in his normal state of health. Past Medical History:  Diagnosis Date  . Borderline hyperlipidemia   . Cancer ([place])    basal face Dr [name]  . Dyslipidemia   . Lumbar degenerative disc disease   . Nummular eczema   . Syncope     Patient Active Problem List   Diagnosis Date Noted  . Symptomatic bradycardia 11/29/2015    Past Surgical History:  Procedure Laterality Date  . basal/squamous cell face    . bilateral blepharoplasy    . COLONOSCOPY     polyps remove  . COLONOSCOPY WITH PROPOFOL N/A 11/24/2014   Procedure: COLONOSCOPY WITH PROPOFOL;  Surgeon: Garlan Fair, MD;  Location: [facility];  Service: Endoscopy;  Laterality: N/A;  . L3 Gill procedure with L3 PLIF with interbody implant L3-4 posterlateral asthrodesis and posterior instrumentation of bone graft    . nasal polyps    . NASAL SEPTUM SURGERY    . VASECTOMY         Home Medications    Prior to Admission medications   Medication Sig Start Date End Date Taking? Authorizing Provider  glucosamine-chondroitin 500-400  MG tablet Take 2 tablets by mouth daily.   Yes Historical Provider, MD  TURMERIC PO Take 1 capsule by mouth daily.   Yes Historical Provider, MD    Family History Family History  Problem Relation Age of Onset  . Asthma Mother   . Asthma Sister   . Cancer Sister     pancreatic    Social History Social History  Substance Use Topics  . Smoking status: Never Smoker  . Smokeless tobacco: Not on file  . Alcohol use 0.6 - 1.2 oz/week    1 - 2 Cans of beer per week     Allergies   Aspirin; Morphine sulfate; and Nsaids   Review of Systems Review of Systems  Constitutional: Positive for diaphoresis. Negative for chills and fever.  Respiratory: Negative for shortness of breath.   Cardiovascular: Negative for chest pain, palpitations and leg swelling.  Gastrointestinal: Negative for abdominal pain, diarrhea, nausea and vomiting.  Musculoskeletal: Negative for back pain and neck pain.  Skin: Negative for rash and wound.  Neurological: Positive for dizziness and light-headedness. Negative for syncope and headaches.  All other systems reviewed and are negative.    Physical Exam Updated Vital Signs BP 130/80   Pulse (!) 44   Temp 97.8 F (36.6 C) (Oral)   Resp 13   Ht 6' (1.829 m)   Wt 250 lb (113.4 kg)   SpO2 99%   BMI 33.91  kg/m   Physical Exam  Constitutional: He is oriented to person, place, and time. He appears well-developed and well-nourished. He appears distressed.  Obvious discomfort being externally paced  HENT:  Head: Normocephalic and atraumatic.  Mouth/Throat: Oropharynx is clear and moist.  Eyes: EOM are normal. Pupils are equal, round, and reactive to light.  Neck: Normal range of motion. Neck supple. No JVD present.  Cardiovascular: Regular rhythm.   Bradycardia  Pulmonary/Chest: Effort normal and breath sounds normal. No respiratory distress. He has no wheezes. He has no rales. He exhibits no tenderness.  Abdominal: Soft. Bowel sounds are normal. There  is no tenderness. There is no rebound and no guarding.  Musculoskeletal: Normal range of motion. He exhibits no edema or tenderness.  No lower sugary swelling, asymmetry or tenderness. 1+ distal pulses in all extremities.  Neurological: He is alert and oriented to person, place, and time.  Moves all ext without focal deficit. Sensation intact.  Skin: Skin is warm and dry. Capillary refill takes less than 2 seconds. No rash noted. No erythema.  Nursing note and vitals reviewed.    ED Treatments / Results  Labs (all labs ordered are listed, but only abnormal results are displayed) Labs Reviewed  BASIC METABOLIC PANEL - Abnormal; Notable for the following:       Result Value   Glucose, Bld 136 (*)    All other components within normal limits  CBC - Abnormal; Notable for the following:    Platelets 143 (*)    All other components within normal limits  I-STAT TROPOININ, ED    EKG  EKG Interpretation  Date/Time:  Sunday November 29 2015 11:02:42 EDT Ventricular Rate:  51 PR Interval:    QRS Duration: 107 QT Interval:  409 QTC Calculation: 377 R Axis:   -24 Text Interpretation:  Sinus rhythm Prolonged PR interval Borderline left axis deviation Anteroseptal infarct, age indeterminate Confirmed by Lita Mains  MD, Keimari Wauzeka ([id number]) on 11/29/2015 11:20:28 AM       Radiology Dg Chest Port 1 View  Result Date: 11/29/2015 CLINICAL DATA:  Pt reports he was working outside this morning when he became light headed and diaphoretic so he went inside to rest. Pt reports the symptoms continued so he called EMS. Pt denies CP. Pt was bradycardic at 30 when EMS arrived and head decreased level of consciousness. EXAM: PORTABLE CHEST 1 VIEW COMPARISON:  None. FINDINGS: Heart size is upper normal. Overall cardiomediastinal silhouette is within normal limits, partially obscured by overlying pacer pads. Lungs are clear. No pleural effusion or pneumothorax seen. Osseous and soft tissue structures about the  chest are unremarkable. IMPRESSION: No active disease. Electronically Signed   By: Franki Cabot M.D.   On: 11/29/2015 11:36    Procedures Procedures (including critical care time)  Medications Ordered in ED Medications  sodium chloride 0.9 % bolus 1,000 mL (0 mLs Intravenous Stopped 11/29/15 1209)     Initial Impression / Assessment and Plan / ED Course  I have reviewed the triage vital signs and the nursing notes.  Pertinent labs & imaging results that were available during my care of the patient were reviewed by me and considered in my medical decision making (see chart for details).  Clinical Course   Pacing was halted. Patient maintained heart rate in the 50s. Stable blood pressure. Given IV fluids. Patient states he normally maintained heart rate in the low 60s. We'll continue to monitor closely. Pacing pads are placed.  Final Clinical Impressions(s) / ED Diagnoses  Final diagnoses:  Symptomatic bradycardia   Discussed with Dr. Rayann Heman. Will see patient in the emergency department.  Remained stable in the emergency department. Dr. Rayann Heman to admit to telemetry observation bed. New Prescriptions New Prescriptions   No medications on file     Julianne Rice, MD 11/29/15 1222

## 2015-11-29 NOTE — ED Triage Notes (Signed)
Pt reports he was working outside this morning when he became light headed and diaphoretic so he went inside to rest.  Pt reports the symptoms continued so he called EMS.  Pt denies CP.  Pt was bradycardic at 30 when EMS arrived and had decreased LOC.  EMS began demand pacing.

## 2015-11-29 NOTE — ED Notes (Signed)
CBG 127 

## 2015-11-30 ENCOUNTER — Encounter (HOSPITAL_COMMUNITY): Payer: Self-pay | Admitting: Internal Medicine

## 2015-11-30 ENCOUNTER — Encounter (HOSPITAL_COMMUNITY)
Admission: EM | Disposition: A | Discharge status: Home / Self Care | Payer: Self-pay | Source: Home / Self Care | Attending: Internal Medicine

## 2015-11-30 ENCOUNTER — Observation Stay (HOSPITAL_BASED_OUTPATIENT_CLINIC_OR_DEPARTMENT_OTHER): Payer: 59

## 2015-11-30 DIAGNOSIS — E785 Hyperlipidemia, unspecified: Secondary | ICD-10-CM | POA: Diagnosis present

## 2015-11-30 DIAGNOSIS — Z6827 Body mass index (BMI) 27.0-27.9, adult: Secondary | ICD-10-CM | POA: Diagnosis not present

## 2015-11-30 DIAGNOSIS — I495 Sick sinus syndrome: Secondary | ICD-10-CM | POA: Diagnosis present

## 2015-11-30 DIAGNOSIS — R001 Bradycardia, unspecified: Secondary | ICD-10-CM

## 2015-11-30 DIAGNOSIS — E7800 Pure hypercholesterolemia, unspecified: Secondary | ICD-10-CM | POA: Diagnosis present

## 2015-11-30 DIAGNOSIS — M5136 Other intervertebral disc degeneration, lumbar region: Secondary | ICD-10-CM | POA: Diagnosis present

## 2015-11-30 DIAGNOSIS — E663 Overweight: Secondary | ICD-10-CM | POA: Diagnosis present

## 2015-11-30 DIAGNOSIS — I4519 Other right bundle-branch block: Secondary | ICD-10-CM | POA: Diagnosis present

## 2015-11-30 HISTORY — PX: EP IMPLANTABLE DEVICE: SHX172B

## 2015-11-30 LAB — ECHOCARDIOGRAM COMPLETE
CHL CUP MV DEC (S): 250
E decel time: 250 msec
EERAT: 7.31
FS: 44 % (ref 28–44)
Height: 79 in
IV/PV OW: 0.93
LA ID, A-P, ES: 40 mm
LA diam end sys: 40 mm
LA vol A4C: 50.2 ml
LA vol index: 23.1 mL/m2
LA vol: 57.6 mL
LADIAMINDEX: 1.61 cm/m2
LDCA: 5.31 cm2
LV E/e' medial: 7.31
LV E/e'average: 7.31
LV TDI E'LATERAL: 9.68
LV e' LATERAL: 9.68 cm/s
LVOTD: 26 mm
MV Peak grad: 2 mmHg
MV pk A vel: 64 m/s
MVPKEVEL: 70.8 m/s
PW: 12.5 mm — AB (ref 0.6–1.1)
RV LATERAL S' VELOCITY: 16 cm/s
TAPSE: 21.4 mm
TDI e' medial: 7.72
Weight: 3934.4 oz

## 2015-11-30 LAB — BASIC METABOLIC PANEL
Anion gap: 5 (ref 5–15)
BUN: 12 mg/dL (ref 6–20)
CALCIUM: 9.2 mg/dL (ref 8.9–10.3)
CO2: 26 mmol/L (ref 22–32)
CREATININE: 1.07 mg/dL (ref 0.61–1.24)
Chloride: 109 mmol/L (ref 101–111)
GFR calc Af Amer: >60 mL/min (ref >60)
Glucose, Bld: 113 mg/dL — ABNORMAL HIGH (ref 65–99)
POTASSIUM: 3.8 mmol/L (ref 3.5–5.1)
SODIUM: 140 mmol/L (ref 135–145)

## 2015-11-30 LAB — LIPID PANEL
CHOLESTEROL: 204 mg/dL — AB (ref 0–200)
HDL: 37 mg/dL — ABNORMAL LOW (ref >40)
LDL Cholesterol: 139 mg/dL — ABNORMAL HIGH (ref 0–99)
Total CHOL/HDL Ratio: 5.5 RATIO
Triglycerides: 139 mg/dL (ref <150)
VLDL: 28 mg/dL (ref 0–40)

## 2015-11-30 LAB — TROPONIN I

## 2015-11-30 LAB — TSH: TSH: 1.085 u[IU]/mL (ref 0.350–4.500)

## 2015-11-30 LAB — GLUCOSE, CAPILLARY: GLUCOSE-CAPILLARY: 127 mg/dL — AB (ref 65–99)

## 2015-11-30 SURGERY — PACEMAKER IMPLANT

## 2015-11-30 MED ORDER — CEFAZOLIN SODIUM-DEXTROSE 2-3 GM-% IV SOLR
INTRAVENOUS | Status: DC | PRN
  Administered 2015-11-30: 2 g via INTRAVENOUS

## 2015-11-30 MED ORDER — SODIUM CHLORIDE 0.9 % IV SOLN
INTRAVENOUS | Status: DC | PRN
  Administered 2015-11-30: 100 mL/h via INTRAVENOUS

## 2015-11-30 MED ORDER — CEFAZOLIN SODIUM-DEXTROSE 2-4 GM/100ML-% IV SOLN: 2.0000 g | INTRAVENOUS | Status: DC

## 2015-11-30 MED ORDER — SODIUM CHLORIDE 0.9 % IV SOLN: INTRAVENOUS | Status: DC

## 2015-11-30 MED ORDER — FENTANYL CITRATE (PF) 100 MCG/2ML IJ SOLN
INTRAMUSCULAR | Status: DC | PRN
  Administered 2015-11-30 (×2): 25 ug via INTRAVENOUS

## 2015-11-30 MED ORDER — MIDAZOLAM HCL 5 MG/5ML IJ SOLN
INTRAMUSCULAR | Status: AC
  Filled 2015-11-30: qty 5

## 2015-11-30 MED ORDER — FENTANYL CITRATE (PF) 100 MCG/2ML IJ SOLN
INTRAMUSCULAR | Status: AC
  Filled 2015-11-30: qty 2

## 2015-11-30 MED ORDER — ACETAMINOPHEN 325 MG PO TABS: 325.0000 mg | ORAL_TABLET | ORAL | Status: DC | PRN

## 2015-11-30 MED ORDER — MIDAZOLAM HCL 5 MG/5ML IJ SOLN
INTRAMUSCULAR | Status: DC | PRN
  Administered 2015-11-30: 1 mg via INTRAVENOUS
  Administered 2015-11-30: 2 mg via INTRAVENOUS
  Administered 2015-11-30: 1 mg via INTRAVENOUS

## 2015-11-30 MED ORDER — LIDOCAINE HCL (PF) 1 % IJ SOLN
INTRAMUSCULAR | Status: AC
  Filled 2015-11-30: qty 60

## 2015-11-30 MED ORDER — LIDOCAINE HCL (PF) 1 % IJ SOLN
INTRAMUSCULAR | Status: DC | PRN
  Administered 2015-11-30: 40 mL

## 2015-11-30 MED ORDER — CEFAZOLIN SODIUM-DEXTROSE 2-4 GM/100ML-% IV SOLN
INTRAVENOUS | Status: AC
  Filled 2015-11-30: qty 100

## 2015-11-30 MED ORDER — HEPARIN (PORCINE) IN NACL 2-0.9 UNIT/ML-% IJ SOLN
INTRAMUSCULAR | Status: DC | PRN
  Administered 2015-11-30: 500 mL

## 2015-11-30 MED ORDER — ONDANSETRON HCL 4 MG/2ML IJ SOLN: 4.0000 mg | Freq: Four times a day (QID) | INTRAMUSCULAR | Status: DC | PRN

## 2015-11-30 MED ORDER — SODIUM CHLORIDE 0.9 % IR SOLN
Status: AC
  Filled 2015-11-30: qty 2

## 2015-11-30 MED ORDER — HEPARIN (PORCINE) IN NACL 2-0.9 UNIT/ML-% IJ SOLN
INTRAMUSCULAR | Status: AC
  Filled 2015-11-30: qty 500

## 2015-11-30 MED ORDER — CHLORHEXIDINE GLUCONATE 4 % EX LIQD: 60.0000 mL | Freq: Once | CUTANEOUS | Status: DC

## 2015-11-30 MED ORDER — SODIUM CHLORIDE 0.9 % IR SOLN: 80.0000 mg | Status: DC

## 2015-11-30 MED ORDER — CHLORHEXIDINE GLUCONATE 4 % EX LIQD
60.0000 mL | Freq: Once | CUTANEOUS | Status: DC
  Filled 2015-11-30: qty 60

## 2015-11-30 MED ORDER — CEFAZOLIN SODIUM-DEXTROSE 2-4 GM/100ML-% IV SOLN
2.0000 g | Freq: Four times a day (QID) | INTRAVENOUS | Status: AC
  Administered 2015-11-30 – 2015-12-01 (×3): 2 g via INTRAVENOUS
  Filled 2015-11-30 (×3): qty 100

## 2015-11-30 MED ORDER — SODIUM CHLORIDE 0.9 % IR SOLN
Status: DC | PRN
  Administered 2015-11-30: 500 mL

## 2015-11-30 SURGICAL SUPPLY — 7 items
CABLE SURGICAL S-101-97-12 (CABLE) ×1 IMPLANT
LEAD TENDRIL MRI 52CM LPA1200M (Lead) ×1 IMPLANT
LEAD TENDRIL MRI 58CM LPA1200M (Lead) ×1 IMPLANT
PACEMAKER ASSURITY DR-RF (Pacemaker) ×1 IMPLANT
PAD DEFIB LIFELINK (PAD) ×1 IMPLANT
SHEATH CLASSIC 8F (SHEATH) ×2 IMPLANT
TRAY PACEMAKER INSERTION (PACKS) ×1 IMPLANT

## 2015-11-30 NOTE — Progress Notes (Signed)
Orthopedic Tech Progress Note Patient Details:  Anthony Banks [birth year] [id number] Patient already has sling. Patient ID: Anthony Banks, male   DOB: 1950/04/23, 65 y.o.   MRN: [id number]   [name] 11/30/2015, 2:15 PM

## 2015-11-30 NOTE — Progress Notes (Signed)
  Echocardiogram 2D Echocardiogram has been performed.  Anthony Banks 11/30/2015, 8:59 AM

## 2015-11-30 NOTE — Progress Notes (Signed)
SUBJECTIVE: The patient is doing well today.  At this time, he denies chest pain, shortness of breath, or any new concerns.  CURRENT MEDICATIONS: . enoxaparin (LOVENOX) injection  40 mg Subcutaneous Q24H  . sodium chloride flush  3 mL Intravenous Q12H      OBJECTIVE: Physical Exam: Vitals:   11/29/15 1315 11/29/15 1409 11/29/15 2206 11/30/15 0610  BP: 137/87 (!) 143/92 122/70 117/73  Pulse: (!) 53  68 (!) 59  Resp: (!) 8 10 16 16   Temp:  97.5 F (36.4 C) 97.8 F (36.6 C) 97.8 F (36.6 C)  TempSrc:  Oral Oral Oral  SpO2: 100% 100% 100% 99%  Weight:    245 lb 14.4 oz (111.5 kg)  Height:    6\' 7"  (2.007 m)    Intake/Output Summary (Last 24 hours) at 11/30/15 0755 Last data filed at 11/29/15 2209  Gross per 24 hour  Intake             1240 ml  Output              525 ml  Net              715 ml    Telemetry reveals sinus rhythm  GEN- The patient is well appearing, alert and oriented x 3 today.   Head- normocephalic, atraumatic Eyes-  Sclera clear, conjunctiva pink Ears- hearing intact Oropharynx- clear Neck- supple  Lungs- Clear to ausculation bilaterally, normal work of breathing Heart- Regular rate and rhythm, no murmurs, rubs or gallops  GI- soft, NT, ND, + BS Extremities- no clubbing, cyanosis, or edema Skin- no rash or lesion Psych- euthymic mood, full affect Neuro- strength and sensation are intact  LABS: Basic Metabolic Panel:  Recent Labs  11/29/15 1110 11/30/15 0435  NA 138 140  K 4.2 3.8  CL 105 109  CO2 27 26  GLUCOSE 136* 113*  BUN 17 12  CREATININE 1.14 1.07  CALCIUM 9.3 9.2   CBC:  Recent Labs  11/29/15 1110  WBC 5.8  HGB 14.0  HCT 41.6  MCV 93.5  PLT 143*   Cardiac Enzymes:  Recent Labs  11/30/15 0435  TROPONINI <0.03    Recent Labs  11/30/15 0435  CHOL 204*  HDL 37*  LDLCALC 139*  TRIG 139  CHOLHDL 5.5   Thyroid Function Tests:  Recent Labs  11/30/15 0435  TSH 1.085    RADIOLOGY: Dg Chest Port 1  View Result Date: 11/29/2015 CLINICAL DATA:  Pt reports he was working outside this morning when he became light headed and diaphoretic so he went inside to rest. Pt reports the symptoms continued so he called EMS. Pt denies CP. Pt was bradycardic at 30 when EMS arrived and head decreased level of consciousness. EXAM: PORTABLE CHEST 1 VIEW COMPARISON:  None. FINDINGS: Heart size is upper normal. Overall cardiomediastinal silhouette is within normal limits, partially obscured by overlying pacer pads. Lungs are clear. No pleural effusion or pneumothorax seen. Osseous and soft tissue structures about the chest are unremarkable. IMPRESSION: No active disease. Electronically Signed   By: Franki Cabot M.D.   On: 11/29/2015 11:36    ASSESSMENT AND PLAN:  Active Problems:   Symptomatic bradycardia   Pure hypercholesterolemia   Sinus bradycardia  1.  Sick sinus syndrome The patient presented with symptomatic bradycardia and no reversible causes identified. Will obtain echo today. He would like to proceed with pacemaker implantation. Risks, benefits reviewed with the patient by Dr Rayann Heman who wishes  to proceed.   Will make NPO after midnight and hope to proceed later today pending on schedule   Dr Rayann Heman to see later today.   Chanetta Marshall, NP 11/30/2015 8:00 AM  I have seen, examined the patient, and reviewed the above assessment and plan. On exam, RRR. Changes to above are made where necessary.  Echo is pending.  Anticipate PPM either today or tomorrow (depending on the schedule).  Co Sign: Thompson Grayer, MD 11/30/2015 9:43 AM

## 2015-11-30 NOTE — H&P (Signed)
CC: presyncope  Anthony Banks is a 65 y.o. male with no prior cardiac history who presents with symptomatic bradycardia.  The patient reports that he is typically in good health.  He is active without ischemic symptoms.  He plays golf and exercises regularly.  This am, he was working in his driveway when he bent over and became very lightheaded.  He reports associated diaphoresis and presyncope.  He laid down on his driveway due to profound presyncope.  His symptoms resolved after several minutes but recurred 3 different times in waves.  He denies associated CP or SOB.  He called EMS.  While being transported, he felt his symptoms return.  He was found to have profound sinus bradycardia (HR 30s) as the cause and required epicardial pacing while en route to [facility].  His heart rate recovered by the time he arrived to [facility].  He denies syncope.  Presently, he is resting comfortably and is without complaint.  He reports similar symptoms last evening after eating ice cream at home.  He also had similar symptoms with presyncope about 3 years ago for which also had to lay on his driveway at that time. His bradycardia has currently improved. Presently, he denies symptoms of palpitations, chest pain, shortness of breath, orthopnea, PND, lower extremity edema, other or neurologic sequela. The patient is tolerating medications without difficulties and is otherwise without complaint today.       Past Medical History:  Diagnosis Date  . Borderline hyperlipidemia   . Cancer ([place])    basal face Dr [name]  . Lumbar degenerative disc disease   . Nummular eczema   . Overweight   . Syncope         Past Surgical History:  Procedure Laterality Date  . basal/squamous cell face    . bilateral blepharoplasy    . COLONOSCOPY     polyps remove  . COLONOSCOPY WITH PROPOFOL N/A 11/24/2014   Procedure: COLONOSCOPY WITH PROPOFOL;  Surgeon: Garlan Fair, MD;  Location: [facility];   Service: Endoscopy;  Laterality: N/A;  . L3 Gill procedure with L3 PLIF with interbody implant L3-4 posterlateral asthrodesis and posterior instrumentation of bone graft    . nasal polyps    . NASAL SEPTUM SURGERY    . VASECTOMY              Allergies  Allergen Reactions  . Aspirin Hives and Other (See Comments)    Tight chest  . Morphine Sulfate Swelling  . Nsaids Other (See Comments)    Unknown    Social History        Social History  . Marital status: Married    Spouse name: N/A  . Number of children: N/A  . Years of education: N/A      Occupational History  . Not on file.        Social History Main Topics  . Smoking status: Never Smoker  . Smokeless tobacco: Not on file  . Alcohol use 0.6 - 1.2 oz/week    1 - 2 Cans of beer per week  . Drug use: No  . Sexual activity: Not on file       Other Topics Concern  . Not on file      Social History Narrative   Tobacco Use: Cigarettes: Never Smoked   Non-Smoker for Personal reasons   Alcohol: 1-2 Per week, beer   Exercise: Elliptical 5X a week, wts, body wt   Occupation: Chartered certified accountant  [place]   Marital Status: Married    Lives in [place]          Family History  Problem Relation Age of Onset  . Asthma Mother   . Asthma Sister   . Cancer Sister     pancreatic  denies FH of sudden death or arrhythmia.  ROS- All systems are reviewed and negative except as per the HPI above  Physical Exam: Telemetry: sinus rhythm 50s       Vitals:   11/29/15 1230 11/29/15 1300 11/29/15 1315 11/29/15 1409  BP: 131/94 146/89 137/87 (!) 143/92  Pulse: (!) 48 (!) 54 (!) 53   Resp: (!) 9 10 (!) 8 10  Temp:    97.5 F (36.4 C)  TempSrc:    Oral  SpO2: 100% 100% 100% 100%  Weight:      Height:        GEN- The patient is overweight but well appearing, alert and oriented x 3 today.   Head- normocephalic, atraumatic Eyes-  Sclera clear,  conjunctiva pink Ears- hearing intact Oropharynx- clear Neck- supple, no JVP Lymph- no cervical lymphadenopathy Lungs- Clear to ausculation bilaterally, normal work of breathing Heart- bradycardic regular rhythm, no murmurs, rubs or gallops, PMI not laterally displaced GI- soft, NT, ND, + BS Extremities- no clubbing, cyanosis, or edema MS- no significant deformity or atrophy Skin- no rash or lesion Psych- euthymic mood, full affect Neuro- strength and sensation are intact  EKG reveals sinus rhythm 51 bpm, first degree AV block, incomplete RBBB  Rhythm strips from EMS are reviewed and confirm sinus bradycardia 30s during the time of his symptoms.   I do not see AV block.  Labs:   Recent Labs  Lab Results  Component Value Date   WBC 5.8 11/29/2015   HGB 14.0 11/29/2015   HCT 41.6 11/29/2015   MCV 93.5 11/29/2015   PLT 143 (L) 11/29/2015       Last Labs    Recent Labs Lab 11/29/15 1110  NA 138  K 4.2  CL 105  CO2 27  BUN 17  CREATININE 1.14  CALCIUM 9.3  GLUCOSE 136*      ASSESSMENT AND PLAN:   1.  Sick sinus syndrome The patient has symptomatic sinus bradycardia with presyncope.  I would therefore recommend pacemaker implantation at this time.  Risks, benefits, alternatives to pacemaker implantation were discussed in detail with the patient today.  At this time, he is not sure if he would like to proceed.  He enjoys golf and wishes to wait a few weeks/ months before we proceed. He is willing to be admitted for overnight observation and will further contemplate PPM while here.  Obtain echo and TSH. Ischemic seems unlikely as he has no ischemic symptoms. Check troponin in am  2. Borderline HL Fasting lipids in am  Mikle Bosworth.D.

## 2015-12-01 ENCOUNTER — Inpatient Hospital Stay (HOSPITAL_COMMUNITY): Payer: 59

## 2015-12-01 NOTE — Discharge Summary (Signed)
ELECTROPHYSIOLOGY PROCEDURE DISCHARGE SUMMARY    Patient ID: Anthony Banks,  MRN: [id number], DOB/AGE: 1950-11-24 65 y.o.  Admit date: 11/29/2015 Discharge date: 12/01/2015  Primary Care Physician: Irven Shelling, MD Electrophysiologist: Lovena Le  Primary Discharge Diagnosis:  Symptomatic sinus bradycardia status post pacemaker implantation this admission  Secondary Discharge Diagnosis:  1.  Hyperlipidemia 2.  DDD  Allergies  Allergen Reactions  . Aspirin Hives and Other (See Comments)    Tight chest  . Morphine Sulfate Swelling  . Nsaids Other (See Comments)    Unknown     Procedures This Admission:  1.  Echo 11/30/15 demonstrated EF 60-65%, no RWMA 2.  Implantation of a STJ dual chamber PPM on 11/30/15 by Dr Lovena Le.  See op note for full details. There were no immediate post procedure complications. 2.  CXR on 12/01/15 demonstrated no pneumothorax status post device implantation.   Brief HPI/Hospital Course:  Anthony Banks is a 65 y.o. male with no significant past medical history presented to the ER for evaluation of pre-syncope. He had several episodes the day of admission associated with sinus bradycardia in the 30's.  The patient has had symptomatic bradycardia without reversible causes identified.  Risks, benefits, and alternatives to PPM implantation were reviewed with the patient who wished to proceed.  The patient underwent implantation of a STJ dual chamber PPM with details as outlined above.  He  was monitored on telemetry overnight which demonstrated sinus rhythm.  Left chest was without hematoma or ecchymosis.  The device was interrogated and found to be functioning normally.  CXR was obtained and demonstrated no pneumothorax status post device implantation.  Wound care, arm mobility, and restrictions were reviewed with the patient.  The patient was examined and considered stable for discharge to home.    Physical Exam: Vitals:   11/30/15 2008 12/01/15  0005 12/01/15 0410 12/01/15 0756  BP: (!) 129/99 115/72 123/80 (!) 139/94  Pulse: 68 65 64 67  Resp:    20  Temp: 98.7 F (37.1 C) 98.3 F (36.8 C) 98.3 F (36.8 C) 97.8 F (36.6 C)  TempSrc: Oral Oral Oral Oral  SpO2: 97% 97% 97% 98%  Weight:   248 lb 3.2 oz (112.6 kg)   Height:        GEN- The patient is well appearing, alert and oriented x 3 today.   HEENT: normocephalic, atraumatic; sclera clear, conjunctiva pink; hearing intact; oropharynx clear; neck supple  Lungs- Clear to ausculation bilaterally, normal work of breathing.  No wheezes, rales, rhonchi Heart- Regular rate and rhythm   GI- soft, non-tender, non-distended, bowel sounds present Extremities- no clubbing, cyanosis, or edema; DP/PT/radial pulses 2+ bilaterally MS- no significant deformity or atrophy Skin- warm and dry, no rash or lesion, left chest without hematoma/ecchymosis Psych- euthymic mood, full affect Neuro- strength and sensation are intact   Labs:   Lab Results  Component Value Date   WBC 5.8 11/29/2015   HGB 14.0 11/29/2015   HCT 41.6 11/29/2015   MCV 93.5 11/29/2015   PLT 143 (L) 11/29/2015    Recent Labs Lab 11/30/15 0435  NA 140  K 3.8  CL 109  CO2 26  BUN 12  CREATININE 1.07  CALCIUM 9.2  GLUCOSE 113*    Discharge Medications:    Medication List    TAKE these medications   glucosamine-chondroitin 500-400 MG tablet Take 2 tablets by mouth daily.   TURMERIC PO Take 1 capsule by mouth daily.  Disposition:  Discharge Instructions    Diet - low sodium heart healthy    Complete by:  As directed    Increase activity slowly    Complete by:  As directed      Follow-up Information    [place] [facility] Follow up on 12/07/2015.   Specialty:  Cardiology Why:  at [facility] for wound check  Contact information: [address] [place]       Cristopher Peru, MD Follow up on 03/01/2016.   Specialty:   Cardiology Why:  at 9:45AM Contact information: [address] [address] [phone]           Duration of Discharge Encounter: Greater than 30 minutes including physician time.  Signed, Chanetta Marshall, NP 12/01/2015 8:46 AM  EP Attending  Patient seen and examined. Agree with above. He is stable for DC. Usual followup.  Mikle Bosworth.D.

## 2015-12-01 NOTE — Discharge Instructions (Signed)
° ° °  Supplemental Discharge Instructions for  Pacemaker/Defibrillator Patients  Activity No heavy lifting or vigorous activity with your left/right arm for 6 to 8 weeks.  Do not raise your left/right arm above your head for one week.  Gradually raise your affected arm as drawn below.           __      12/05/15                    12/06/15                   12/07/15                    12/08/15  NO DRIVING for 1 week    ; you may begin driving on   [id number]  .  WOUND CARE - Keep the wound area clean and dry.  Do not get this area wet for one week. No showers for one week; you may shower on   12/08/15  . - The tape/steri-strips on your wound will fall off; do not pull them off.  No bandage is needed on the site.  DO  NOT apply any creams, oils, or ointments to the wound area. - If you notice any drainage or discharge from the wound, any swelling or bruising at the site, or you develop a fever > 101? F after you are discharged home, call the office at once.  Special Instructions - You are still able to use cellular telephones; use the ear opposite the side where you have your pacemaker/defibrillator.  Avoid carrying your cellular phone near your device. - When traveling through airports, show security personnel your identification card to avoid being screened in the metal detectors.  Ask the security personnel to use the hand wand. - Avoid arc welding equipment, TENS units (transcutaneous nerve stimulators).  Call the office for questions about other devices. - Avoid electrical appliances that are in poor condition or are not properly grounded. - Microwave ovens are safe to be near or to operate.

## 2015-12-07 ENCOUNTER — Ambulatory Visit: Payer: 59

## 2015-12-16 ENCOUNTER — Ambulatory Visit (INDEPENDENT_AMBULATORY_CARE_PROVIDER_SITE_OTHER): Payer: 59 | Admitting: *Deleted

## 2015-12-16 DIAGNOSIS — Z950 Presence of cardiac pacemaker: Secondary | ICD-10-CM

## 2015-12-16 DIAGNOSIS — R001 Bradycardia, unspecified: Secondary | ICD-10-CM | POA: Diagnosis not present

## 2015-12-16 LAB — CUP PACEART INCLINIC DEVICE CHECK
Battery Remaining Longevity: 96 mo
Battery Voltage: 3.05 V
Brady Statistic RA Percent Paced: 57 %
Brady Statistic RV Percent Paced: 3.8 %
Date Time Interrogation Session: 20171101162314
Implantable Lead Implant Date: 20171016
Implantable Lead Location: 753859
Lead Channel Pacing Threshold Amplitude: 0.5 V
Lead Channel Pacing Threshold Pulse Width: 0.5 ms
Lead Channel Sensing Intrinsic Amplitude: 5 mV
Lead Channel Setting Pacing Amplitude: 3.5 V
MDC IDC LEAD IMPLANT DT: 20171016
MDC IDC LEAD LOCATION: 753860
MDC IDC MSMT LEADCHNL RA IMPEDANCE VALUE: 537.5 Ohm
MDC IDC MSMT LEADCHNL RV IMPEDANCE VALUE: 550 Ohm
MDC IDC MSMT LEADCHNL RV PACING THRESHOLD AMPLITUDE: 1 V
MDC IDC MSMT LEADCHNL RV PACING THRESHOLD PULSEWIDTH: 0.5 ms
MDC IDC MSMT LEADCHNL RV SENSING INTR AMPL: 12 mV
MDC IDC PG IMPLANT DT: 20171016
MDC IDC PG SERIAL: 7961269
MDC IDC SET LEADCHNL RA PACING AMPLITUDE: 3.5 V
MDC IDC SET LEADCHNL RV PACING PULSEWIDTH: 0.5 ms
MDC IDC SET LEADCHNL RV SENSING SENSITIVITY: 2 mV
Pulse Gen Model: 2272

## 2015-12-16 NOTE — Progress Notes (Signed)
Wound check appointment. Steri-strips removed. Wound without redness or edema. Incision edges approximated, wound well healed. Normal device function. Thresholds, sensing, and impedances consistent with implant measurements. Device programmed at 3.5V for appropriate safety margin until 3 month f/u appointment. Histogram distribution appropriate for patient and level of activity. No mode switches or high ventricular rates noted. Patient educated about wound care, arm mobility, lifting restrictions. ROV with GT on 03/01/16.

## 2016-03-01 ENCOUNTER — Encounter: Payer: 59 | Admitting: Internal Medicine

## 2016-04-04 ENCOUNTER — Encounter (INDEPENDENT_AMBULATORY_CARE_PROVIDER_SITE_OTHER): Payer: Self-pay

## 2016-04-04 ENCOUNTER — Encounter: Payer: Self-pay | Admitting: Internal Medicine

## 2016-04-04 ENCOUNTER — Ambulatory Visit (INDEPENDENT_AMBULATORY_CARE_PROVIDER_SITE_OTHER): Payer: 59 | Admitting: Internal Medicine

## 2016-04-04 VITALS — BP 116/82 | HR 61 | Ht 78.0 in | Wt 256.6 lb

## 2016-04-04 DIAGNOSIS — R001 Bradycardia, unspecified: Secondary | ICD-10-CM

## 2016-04-04 DIAGNOSIS — Z950 Presence of cardiac pacemaker: Secondary | ICD-10-CM | POA: Diagnosis not present

## 2016-04-04 NOTE — Progress Notes (Signed)
HPI Mr. Anthony Banks returns today for followup. He is a pleasant 66 yo man with a diagnosis of symptomatic bradycardia due to SSS who underwent PPM insertion over 3 months ago. In the interim, he has done well. He has gone back to exercising. No chest pain, sob, or syncope. No complaints today. Allergies  Allergen Reactions  . Aspirin Hives and Other (See Comments)    Tight chest  . Morphine Sulfate Swelling  . Nsaids Other (See Comments)    Unknown     Current Outpatient Prescriptions  Medication Sig Dispense Refill  . glucosamine-chondroitin 500-400 MG tablet Take 2 tablets by mouth daily.    . TURMERIC PO Take 1 capsule by mouth daily.     No current facility-administered medications for this visit.      Past Medical History:  Diagnosis Date  . Borderline hyperlipidemia   . Cancer ([place])    basal face Dr [name]  . Lumbar degenerative disc disease   . Nummular eczema   . Overweight   . Syncope     ROS:   All systems reviewed and negative except as noted in the HPI.   Past Surgical History:  Procedure Laterality Date  . basal/squamous cell face    . bilateral blepharoplasy    . COLONOSCOPY     polyps remove  . COLONOSCOPY WITH PROPOFOL N/A 11/24/2014   Procedure: COLONOSCOPY WITH PROPOFOL;  Surgeon: Garlan Fair, MD;  Location: [facility];  Service: Endoscopy;  Laterality: N/A;  . EP IMPLANTABLE DEVICE N/A 11/30/2015   Procedure: Pacemaker Implant;  Surgeon: Evans Lance, MD;  Location: [facility];  Service: Cardiovascular;  Laterality: N/A;  . L3 Gill procedure with L3 PLIF with interbody implant L3-4 posterlateral asthrodesis and posterior instrumentation of bone graft    . nasal polyps    . NASAL SEPTUM SURGERY    . VASECTOMY       Family History  Problem Relation Age of Onset  . Asthma Mother   . Asthma Sister   . Cancer Sister     pancreatic     Social History   Social History  . Marital status: Married    Spouse name: N/A    . Number of children: N/A  . Years of education: N/A   Occupational History  . Not on file.   Social History Main Topics  . Smoking status: Never Smoker  . Smokeless tobacco: Never Used  . Alcohol use 0.6 - 1.2 oz/week    1 - 2 Cans of beer per week  . Drug use: No  . Sexual activity: Not on file   Other Topics Concern  . Not on file   Social History Narrative   Tobacco Use: Cigarettes: Never Smoked   Non-Smoker for Personal reasons   Alcohol: 1-2 Per week, beer   Exercise: Elliptical 5X a week, wts, body wt   Occupation: Quarry manager   Marital Status: Married    Lives in [place]     BP 116/82   Pulse 61   Ht 6\' 6"  (1.981 m)   Wt 256 lb 9.6 oz (116.4 kg)   BMI 29.65 kg/m   Physical Exam:  Well appearing 66 yo man, NAD HEENT: Unremarkable Neck:  6 cm JVD, no thyromegally Lymphatics:  No adenopathy Back:  No CVA tenderness Lungs:  Clear with no wheezes HEART:  Regular rate rhythm, no murmurs, no rubs, no clicks Abd:  soft, positive bowel  sounds, no organomegally, no rebound, no guarding Ext:  2 plus pulses, no edema, no cyanosis, no clubbing Skin:  No rashes no nodules Neuro:  CN II through XII intact, motor grossly intact  EKG  - NSR with atrial pacing  DEVICE  Normal device function.  See PaceArt for details.   Assess/Plan: 1. Sinus node dysfunction - he is doing well,s/p PPM insertion. 2. PPM - his [employer]. Jude DDD PM is working normally. Will recheck in several months.   Mikle Bosworth.D.

## 2016-04-04 NOTE — Patient Instructions (Addendum)
Medication Instructions:  Your physician recommends that you continue on your current medications as directed. Please refer to the Current Medication list given to you today.   Labwork: None Ordered   Testing/Procedures: None Ordered   Follow-Up: Your physician wants you to follow-up in: October/November 2018 with Dr. Lovena Le. You will receive a reminder letter in the mail two months in advance. If you don't receive a letter, please call our office to schedule the follow-up appointment.  Remote monitoring is used to monitor your Pacemaker from home. This monitoring reduces the number of office visits required to check your device to one time per year. It allows [place] to keep an eye on the functioning of your device to ensure it is working properly. You are scheduled for a device check from home on 07/04/16. You may send your transmission at any time that day. If you have a wireless device, the transmission will be sent automatically. After your physician reviews your transmission, you will receive a postcard with your next transmission date.    Any Other Special Instructions Will Be Listed Below (If Applicable).     If you need a refill on your cardiac medications before your next appointment, please call your pharmacy.

## 2016-04-05 LAB — CUP PACEART INCLINIC DEVICE CHECK
Brady Statistic RA Percent Paced: 55 %
Brady Statistic RV Percent Paced: 5.7 %
Implantable Lead Implant Date: 20171016
Implantable Lead Implant Date: 20171016
Implantable Lead Location: 753860
Lead Channel Impedance Value: 525 Ohm
Lead Channel Pacing Threshold Amplitude: 1 V
Lead Channel Pacing Threshold Pulse Width: 0.5 ms
Lead Channel Pacing Threshold Pulse Width: 0.5 ms
Lead Channel Sensing Intrinsic Amplitude: 12 mV
Lead Channel Sensing Intrinsic Amplitude: 5 mV
Lead Channel Setting Pacing Amplitude: 2 V
Lead Channel Setting Sensing Sensitivity: 2 mV
MDC IDC LEAD LOCATION: 753859
MDC IDC MSMT BATTERY VOLTAGE: 2.99 V
MDC IDC MSMT LEADCHNL RA IMPEDANCE VALUE: 487.5 Ohm
MDC IDC MSMT LEADCHNL RA PACING THRESHOLD AMPLITUDE: 0.5 V
MDC IDC PG IMPLANT DT: 20171016
MDC IDC SESS DTM: 20180219194408
MDC IDC SET LEADCHNL RV PACING AMPLITUDE: 2.5 V
MDC IDC SET LEADCHNL RV PACING PULSEWIDTH: 0.5 ms
Pulse Gen Serial Number: 7961269

## 2016-07-04 ENCOUNTER — Ambulatory Visit (INDEPENDENT_AMBULATORY_CARE_PROVIDER_SITE_OTHER): Payer: 59 | Admitting: *Deleted

## 2016-07-04 DIAGNOSIS — R001 Bradycardia, unspecified: Secondary | ICD-10-CM | POA: Diagnosis not present

## 2016-07-04 NOTE — Progress Notes (Signed)
Remote pacemaker transmission.   

## 2016-07-06 ENCOUNTER — Encounter: Payer: Self-pay | Admitting: Cardiology

## 2016-07-06 LAB — CUP PACEART REMOTE DEVICE CHECK
Battery Remaining Longevity: 108 mo
Battery Remaining Percentage: 95.5 %
Battery Voltage: 3.01 V
Brady Statistic AP VP Percent: 6.2 %
Brady Statistic AP VS Percent: 57 %
Brady Statistic AS VP Percent: 2.1 %
Brady Statistic AS VS Percent: 35 %
Brady Statistic RA Percent Paced: 62 %
Brady Statistic RV Percent Paced: 8.3 %
Date Time Interrogation Session: 20180521060011
Implantable Lead Implant Date: 20171016
Implantable Lead Implant Date: 20171016
Implantable Lead Location: 753859
Implantable Lead Location: 753860
Implantable Pulse Generator Implant Date: 20171016
Lead Channel Impedance Value: 450 Ohm
Lead Channel Impedance Value: 460 Ohm
Lead Channel Pacing Threshold Amplitude: 0.5 V
Lead Channel Pacing Threshold Amplitude: 1 V
Lead Channel Pacing Threshold Pulse Width: 0.5 ms
Lead Channel Pacing Threshold Pulse Width: 0.5 ms
Lead Channel Sensing Intrinsic Amplitude: 12 mV
Lead Channel Sensing Intrinsic Amplitude: 5 mV
Lead Channel Setting Pacing Amplitude: 2 V
Lead Channel Setting Pacing Amplitude: 2.5 V
Lead Channel Setting Pacing Pulse Width: 0.5 ms
Lead Channel Setting Sensing Sensitivity: 2 mV
Pulse Gen Model: 2272
Pulse Gen Serial Number: 7961269

## 2016-10-03 ENCOUNTER — Ambulatory Visit (INDEPENDENT_AMBULATORY_CARE_PROVIDER_SITE_OTHER): Payer: Self-pay | Admitting: *Deleted

## 2016-10-03 DIAGNOSIS — R001 Bradycardia, unspecified: Secondary | ICD-10-CM

## 2016-10-04 NOTE — Progress Notes (Signed)
Remote pacemaker transmission.   

## 2016-10-06 LAB — CUP PACEART REMOTE DEVICE CHECK
Battery Remaining Longevity: 106 mo
Battery Remaining Percentage: 95.5 %
Battery Voltage: 3.01 V
Brady Statistic AP VS Percent: 56 %
Brady Statistic RA Percent Paced: 63 %
Brady Statistic RV Percent Paced: 8.8 %
Date Time Interrogation Session: 20180820060006
Implantable Lead Implant Date: 20171016
Implantable Lead Location: 753859
Implantable Lead Location: 753860
Implantable Pulse Generator Implant Date: 20171016
Lead Channel Impedance Value: 490 Ohm
Lead Channel Pacing Threshold Amplitude: 0.5 V
Lead Channel Pacing Threshold Pulse Width: 0.5 ms
Lead Channel Sensing Intrinsic Amplitude: 5 mV
Lead Channel Setting Pacing Pulse Width: 0.5 ms
Lead Channel Setting Sensing Sensitivity: 2 mV
MDC IDC LEAD IMPLANT DT: 20171016
MDC IDC MSMT LEADCHNL RV IMPEDANCE VALUE: 440 Ohm
MDC IDC MSMT LEADCHNL RV PACING THRESHOLD AMPLITUDE: 1 V
MDC IDC MSMT LEADCHNL RV PACING THRESHOLD PULSEWIDTH: 0.5 ms
MDC IDC MSMT LEADCHNL RV SENSING INTR AMPL: 10 mV
MDC IDC SET LEADCHNL RA PACING AMPLITUDE: 2 V
MDC IDC SET LEADCHNL RV PACING AMPLITUDE: 2.5 V
MDC IDC STAT BRADY AP VP PERCENT: 7.3 %
MDC IDC STAT BRADY AS VP PERCENT: 1.5 %
MDC IDC STAT BRADY AS VS PERCENT: 35 %
Pulse Gen Model: 2272
Pulse Gen Serial Number: 7961269

## 2016-10-14 ENCOUNTER — Encounter: Payer: Self-pay | Admitting: Cardiology

## 2017-01-02 ENCOUNTER — Telehealth: Payer: Self-pay | Admitting: Cardiology

## 2017-01-02 ENCOUNTER — Ambulatory Visit (INDEPENDENT_AMBULATORY_CARE_PROVIDER_SITE_OTHER): Payer: Self-pay | Admitting: *Deleted

## 2017-01-02 DIAGNOSIS — Z950 Presence of cardiac pacemaker: Secondary | ICD-10-CM

## 2017-01-02 DIAGNOSIS — R001 Bradycardia, unspecified: Secondary | ICD-10-CM

## 2017-01-02 NOTE — Progress Notes (Signed)
Remote pacemaker transmission.   

## 2017-01-02 NOTE — Telephone Encounter (Signed)
Spoke with pt and reminded pt of remote transmission that is due today. Pt verbalized understanding.   

## 2017-01-03 LAB — CUP PACEART REMOTE DEVICE CHECK
Battery Remaining Longevity: 106 mo
Brady Statistic AP VS Percent: 55 %
Brady Statistic AS VP Percent: 1.3 %
Date Time Interrogation Session: 20181119082226
Implantable Lead Location: 753860
Lead Channel Impedance Value: 490 Ohm
Lead Channel Pacing Threshold Amplitude: 1 V
Lead Channel Sensing Intrinsic Amplitude: 10.9 mV
Lead Channel Setting Pacing Amplitude: 2 V
Lead Channel Setting Pacing Pulse Width: 0.5 ms
Lead Channel Setting Sensing Sensitivity: 2 mV
MDC IDC LEAD IMPLANT DT: 20171016
MDC IDC LEAD IMPLANT DT: 20171016
MDC IDC LEAD LOCATION: 753859
MDC IDC MSMT BATTERY REMAINING PERCENTAGE: 95.5 %
MDC IDC MSMT BATTERY VOLTAGE: 3.01 V
MDC IDC MSMT LEADCHNL RA PACING THRESHOLD AMPLITUDE: 0.5 V
MDC IDC MSMT LEADCHNL RA PACING THRESHOLD PULSEWIDTH: 0.5 ms
MDC IDC MSMT LEADCHNL RA SENSING INTR AMPL: 5 mV
MDC IDC MSMT LEADCHNL RV IMPEDANCE VALUE: 460 Ohm
MDC IDC MSMT LEADCHNL RV PACING THRESHOLD PULSEWIDTH: 0.5 ms
MDC IDC PG IMPLANT DT: 20171016
MDC IDC PG SERIAL: 7961269
MDC IDC SET LEADCHNL RV PACING AMPLITUDE: 2.5 V
MDC IDC STAT BRADY AP VP PERCENT: 7.4 %
MDC IDC STAT BRADY AS VS PERCENT: 36 %
MDC IDC STAT BRADY RA PERCENT PACED: 62 %
MDC IDC STAT BRADY RV PERCENT PACED: 8.8 %

## 2017-01-12 ENCOUNTER — Encounter: Payer: Self-pay | Admitting: Cardiology

## 2017-01-25 ENCOUNTER — Encounter: Payer: Self-pay | Admitting: Internal Medicine

## 2017-01-30 ENCOUNTER — Inpatient Hospital Stay (HOSPITAL_COMMUNITY): Payer: Medicare HMO | Admitting: Anesthesiology

## 2017-01-30 ENCOUNTER — Inpatient Hospital Stay (HOSPITAL_COMMUNITY)
Admission: EM | Admit: 2017-01-30 | Discharge: 2017-02-02 | DRG: 027 | Disposition: A | Discharge status: Home / Self Care | Payer: Medicare HMO | Attending: Neurosurgery | Admitting: Neurosurgery

## 2017-01-30 ENCOUNTER — Encounter (HOSPITAL_COMMUNITY)
Admission: EM | Disposition: A | Discharge status: Home / Self Care | Payer: Self-pay | Source: Home / Self Care | Attending: Neurosurgery

## 2017-01-30 ENCOUNTER — Other Ambulatory Visit: Payer: Self-pay

## 2017-01-30 ENCOUNTER — Encounter (HOSPITAL_COMMUNITY): Payer: Self-pay | Admitting: Neurology

## 2017-01-30 ENCOUNTER — Other Ambulatory Visit: Payer: Self-pay | Admitting: Neurosurgery

## 2017-01-30 ENCOUNTER — Emergency Department (HOSPITAL_COMMUNITY): Payer: Medicare HMO

## 2017-01-30 DIAGNOSIS — I6203 Nontraumatic chronic subdural hemorrhage: Secondary | ICD-10-CM | POA: Diagnosis not present

## 2017-01-30 DIAGNOSIS — G8311 Monoplegia of lower limb affecting right dominant side: Secondary | ICD-10-CM | POA: Diagnosis not present

## 2017-01-30 DIAGNOSIS — S065XAA Traumatic subdural hemorrhage with loss of consciousness status unknown, initial encounter: Secondary | ICD-10-CM | POA: Diagnosis present

## 2017-01-30 DIAGNOSIS — I6201 Nontraumatic acute subdural hemorrhage: Secondary | ICD-10-CM | POA: Diagnosis not present

## 2017-01-30 DIAGNOSIS — E785 Hyperlipidemia, unspecified: Secondary | ICD-10-CM | POA: Diagnosis present

## 2017-01-30 DIAGNOSIS — R51 Headache: Secondary | ICD-10-CM | POA: Diagnosis not present

## 2017-01-30 DIAGNOSIS — Z886 Allergy status to analgesic agent status: Secondary | ICD-10-CM

## 2017-01-30 DIAGNOSIS — E7800 Pure hypercholesterolemia, unspecified: Secondary | ICD-10-CM | POA: Diagnosis not present

## 2017-01-30 DIAGNOSIS — I629 Nontraumatic intracranial hemorrhage, unspecified: Secondary | ICD-10-CM | POA: Diagnosis not present

## 2017-01-30 DIAGNOSIS — Z885 Allergy status to narcotic agent status: Secondary | ICD-10-CM | POA: Diagnosis not present

## 2017-01-30 DIAGNOSIS — S065X9A Traumatic subdural hemorrhage with loss of consciousness of unspecified duration, initial encounter: Secondary | ICD-10-CM | POA: Diagnosis present

## 2017-01-30 DIAGNOSIS — S065X0A Traumatic subdural hemorrhage without loss of consciousness, initial encounter: Secondary | ICD-10-CM | POA: Diagnosis not present

## 2017-01-30 DIAGNOSIS — Z981 Arthrodesis status: Secondary | ICD-10-CM

## 2017-01-30 DIAGNOSIS — Z950 Presence of cardiac pacemaker: Secondary | ICD-10-CM | POA: Diagnosis not present

## 2017-01-30 DIAGNOSIS — R001 Bradycardia, unspecified: Secondary | ICD-10-CM | POA: Diagnosis not present

## 2017-01-30 DIAGNOSIS — Z79899 Other long term (current) drug therapy: Secondary | ICD-10-CM

## 2017-01-30 DIAGNOSIS — Z85828 Personal history of other malignant neoplasm of skin: Secondary | ICD-10-CM

## 2017-01-30 HISTORY — PX: CRANIOTOMY: SHX93

## 2017-01-30 LAB — I-STAT TROPONIN, ED: Troponin i, poc: 0 ng/mL (ref 0.00–0.08)

## 2017-01-30 LAB — COMPREHENSIVE METABOLIC PANEL
ALBUMIN: 3.7 g/dL (ref 3.5–5.0)
ALK PHOS: 49 U/L (ref 38–126)
ALT: 28 U/L (ref 17–63)
ANION GAP: 8 (ref 5–15)
AST: 26 U/L (ref 15–41)
BILIRUBIN TOTAL: 0.6 mg/dL (ref 0.3–1.2)
BUN: 12 mg/dL (ref 6–20)
CALCIUM: 10.1 mg/dL (ref 8.9–10.3)
CO2: 27 mmol/L (ref 22–32)
Chloride: 104 mmol/L (ref 101–111)
Creatinine, Ser: 1 mg/dL (ref 0.61–1.24)
GFR calc Af Amer: >60 mL/min (ref >60)
GFR calc non Af Amer: >60 mL/min (ref >60)
GLUCOSE: 111 mg/dL — AB (ref 65–99)
Potassium: 4 mmol/L (ref 3.5–5.1)
SODIUM: 139 mmol/L (ref 135–145)
TOTAL PROTEIN: 7 g/dL (ref 6.5–8.1)

## 2017-01-30 LAB — I-STAT CHEM 8, ED
BUN: 14 mg/dL (ref 6–20)
CALCIUM ION: 1.25 mmol/L (ref 1.15–1.40)
CHLORIDE: 103 mmol/L (ref 101–111)
CREATININE: 0.9 mg/dL (ref 0.61–1.24)
Glucose, Bld: 114 mg/dL — ABNORMAL HIGH (ref 65–99)
HCT: 49 % (ref 39.0–52.0)
Hemoglobin: 16.7 g/dL (ref 13.0–17.0)
Potassium: 4 mmol/L (ref 3.5–5.1)
SODIUM: 142 mmol/L (ref 135–145)
TCO2: 27 mmol/L (ref 22–32)

## 2017-01-30 LAB — CBC
HCT: 46.7 % (ref 39.0–52.0)
HEMOGLOBIN: 15.8 g/dL (ref 13.0–17.0)
MCH: 31.9 pg (ref 26.0–34.0)
MCHC: 33.8 g/dL (ref 30.0–36.0)
MCV: 94.3 fL (ref 78.0–100.0)
PLATELETS: 183 10*3/uL (ref 150–400)
RBC: 4.95 MIL/uL (ref 4.22–5.81)
RDW: 12.8 % (ref 11.5–15.5)
WBC: 7.9 10*3/uL (ref 4.0–10.5)

## 2017-01-30 LAB — DIFFERENTIAL
BASOS ABS: 0 10*3/uL (ref 0.0–0.1)
Basophils Relative: 1 %
EOS PCT: 1 %
Eosinophils Absolute: 0.1 10*3/uL (ref 0.0–0.7)
LYMPHS ABS: 1.8 10*3/uL (ref 0.7–4.0)
LYMPHS PCT: 23 %
Monocytes Absolute: 0.4 10*3/uL (ref 0.1–1.0)
Monocytes Relative: 5 %
NEUTROS ABS: 5.5 10*3/uL (ref 1.7–7.7)
NEUTROS PCT: 70 %

## 2017-01-30 LAB — APTT: aPTT: 29 seconds (ref 24–36)

## 2017-01-30 LAB — ABO/RH: ABO/RH(D): O POS

## 2017-01-30 LAB — PROTIME-INR
INR: 1
PROTHROMBIN TIME: 13.1 s (ref 11.4–15.2)

## 2017-01-30 LAB — PREPARE RBC (CROSSMATCH)

## 2017-01-30 LAB — MRSA PCR SCREENING: MRSA by PCR: NEGATIVE

## 2017-01-30 SURGERY — CRANIOTOMY HEMATOMA EVACUATION SUBDURAL
Anesthesia: General

## 2017-01-30 MED ORDER — BUPIVACAINE HCL (PF) 0.5 % IJ SOLN
INTRAMUSCULAR | Status: DC | PRN
  Administered 2017-01-30: 3 mL

## 2017-01-30 MED ORDER — MAGNESIUM CITRATE PO SOLN: 1.0000 | Freq: Once | ORAL | Status: DC | PRN

## 2017-01-30 MED ORDER — ONDANSETRON HCL 4 MG/2ML IJ SOLN
4.0000 mg | INTRAMUSCULAR | Status: DC | PRN
Start: 2017-01-30 — End: 2017-02-02

## 2017-01-30 MED ORDER — PROMETHAZINE HCL 25 MG PO TABS
12.5000 mg | ORAL_TABLET | ORAL | Status: DC | PRN
Start: 2017-01-30 — End: 2017-02-02

## 2017-01-30 MED ORDER — FENTANYL CITRATE (PF) 100 MCG/2ML IJ SOLN
INTRAMUSCULAR | Status: DC | PRN
  Administered 2017-01-30: 50 ug via INTRAVENOUS

## 2017-01-30 MED ORDER — DOCUSATE SODIUM 100 MG PO CAPS
100.0000 mg | ORAL_CAPSULE | Freq: Two times a day (BID) | ORAL | Status: DC
  Administered 2017-01-30 – 2017-02-02 (×5): 100 mg via ORAL
  Filled 2017-01-30 (×6): qty 1

## 2017-01-30 MED ORDER — 0.9 % SODIUM CHLORIDE (POUR BTL) OPTIME
TOPICAL | Status: DC | PRN
  Administered 2017-01-30: 1000 mL

## 2017-01-30 MED ORDER — LIDOCAINE-EPINEPHRINE 1 %-1:100000 IJ SOLN
INTRAMUSCULAR | Status: DC | PRN
  Administered 2017-01-30: 3 mL

## 2017-01-30 MED ORDER — SODIUM CHLORIDE 0.9 % IV SOLN
INTRAVENOUS | Status: DC
  Administered 2017-01-30: 17:00:00 via INTRAVENOUS

## 2017-01-30 MED ORDER — THROMBIN (RECOMBINANT) 20000 UNITS EX SOLR
CUTANEOUS | Status: AC
  Filled 2017-01-30: qty 20000

## 2017-01-30 MED ORDER — ACETAMINOPHEN 650 MG RE SUPP
650.0000 mg | RECTAL | Status: DC | PRN
Start: 2017-01-30 — End: 2017-02-02

## 2017-01-30 MED ORDER — THROMBIN (RECOMBINANT) 20000 UNITS EX SOLR
CUTANEOUS | Status: DC | PRN
  Administered 2017-01-30: 19:00:00 via TOPICAL

## 2017-01-30 MED ORDER — FENTANYL CITRATE (PF) 100 MCG/2ML IJ SOLN: 25.0000 ug | INTRAMUSCULAR | Status: DC | PRN

## 2017-01-30 MED ORDER — MENTHOL (TOPICAL ANALGESIC) 4 % EX GEL: CUTANEOUS | Status: DC | PRN

## 2017-01-30 MED ORDER — CEFAZOLIN SODIUM-DEXTROSE 2-4 GM/100ML-% IV SOLN
2.0000 g | INTRAVENOUS | Status: AC
  Administered 2017-01-30: 2 g via INTRAVENOUS

## 2017-01-30 MED ORDER — ONDANSETRON HCL 4 MG/2ML IJ SOLN
INTRAMUSCULAR | Status: AC
  Filled 2017-01-30: qty 2

## 2017-01-30 MED ORDER — PROMETHAZINE HCL 25 MG/ML IJ SOLN: 6.2500 mg | INTRAMUSCULAR | Status: DC | PRN

## 2017-01-30 MED ORDER — LIDOCAINE 2% (20 MG/ML) 5 ML SYRINGE
INTRAMUSCULAR | Status: DC | PRN
  Administered 2017-01-30: 60 mg via INTRAVENOUS

## 2017-01-30 MED ORDER — SODIUM CHLORIDE 0.9 % IR SOLN
Status: DC | PRN
  Administered 2017-01-30: 19:00:00

## 2017-01-30 MED ORDER — CEFAZOLIN SODIUM-DEXTROSE 2-4 GM/100ML-% IV SOLN
INTRAVENOUS | Status: AC
  Filled 2017-01-30: qty 100

## 2017-01-30 MED ORDER — GLUCOSAMINE-CHONDROITIN 500-400 MG PO TABS: 2.0000 | ORAL_TABLET | Freq: Every day | ORAL | Status: DC

## 2017-01-30 MED ORDER — BISACODYL 5 MG PO TBEC: 5.0000 mg | DELAYED_RELEASE_TABLET | Freq: Every day | ORAL | Status: DC | PRN

## 2017-01-30 MED ORDER — SODIUM CHLORIDE 0.9 % IV SOLN
INTRAVENOUS | Status: DC | PRN
  Administered 2017-01-30: 18:00:00 via INTRAVENOUS

## 2017-01-30 MED ORDER — PHENYLEPHRINE HCL 10 MG/ML IJ SOLN
INTRAVENOUS | Status: DC | PRN
  Administered 2017-01-30: 25 ug/min via INTRAVENOUS

## 2017-01-30 MED ORDER — LIDOCAINE 2% (20 MG/ML) 5 ML SYRINGE
INTRAMUSCULAR | Status: AC
  Filled 2017-01-30: qty 5

## 2017-01-30 MED ORDER — ONDANSETRON HCL 4 MG/2ML IJ SOLN
INTRAMUSCULAR | Status: DC | PRN
  Administered 2017-01-30: 4 mg via INTRAVENOUS

## 2017-01-30 MED ORDER — DEXAMETHASONE SODIUM PHOSPHATE 10 MG/ML IJ SOLN
INTRAMUSCULAR | Status: DC | PRN
  Administered 2017-01-30: 10 mg via INTRAVENOUS

## 2017-01-30 MED ORDER — TRAMADOL HCL 50 MG PO TABS
50.0000 mg | ORAL_TABLET | Freq: Four times a day (QID) | ORAL | Status: DC | PRN
  Administered 2017-01-30 – 2017-01-31 (×3): 50 mg via ORAL
  Filled 2017-01-30 (×3): qty 1

## 2017-01-30 MED ORDER — ONDANSETRON HCL 4 MG PO TABS
4.0000 mg | ORAL_TABLET | ORAL | Status: DC | PRN
Start: 2017-01-30 — End: 2017-02-02

## 2017-01-30 MED ORDER — HYDROMORPHONE HCL 1 MG/ML IJ SOLN: 0.5000 mg | INTRAMUSCULAR | Status: DC | PRN

## 2017-01-30 MED ORDER — SENNOSIDES-DOCUSATE SODIUM 8.6-50 MG PO TABS
1.0000 | ORAL_TABLET | Freq: Every evening | ORAL | Status: DC | PRN
Start: 2017-01-30 — End: 2017-02-02

## 2017-01-30 MED ORDER — SODIUM CHLORIDE 0.9 % IV SOLN
0.1500 ug/kg/min | INTRAVENOUS | Status: AC
  Administered 2017-01-30: .2 ug/kg/min via INTRAVENOUS
  Filled 2017-01-30: qty 2000

## 2017-01-30 MED ORDER — CEFAZOLIN SODIUM-DEXTROSE 1-4 GM/50ML-% IV SOLN
1.0000 g | Freq: Three times a day (TID) | INTRAVENOUS | Status: AC
  Administered 2017-01-31 (×2): 1 g via INTRAVENOUS
  Filled 2017-01-30 (×2): qty 50

## 2017-01-30 MED ORDER — BUPIVACAINE HCL (PF) 0.5 % IJ SOLN
INTRAMUSCULAR | Status: AC
  Filled 2017-01-30: qty 30

## 2017-01-30 MED ORDER — PANTOPRAZOLE SODIUM 40 MG IV SOLR
40.0000 mg | Freq: Every day | INTRAVENOUS | Status: DC
  Administered 2017-01-30 – 2017-01-31 (×2): 40 mg via INTRAVENOUS
  Filled 2017-01-30 (×2): qty 40

## 2017-01-30 MED ORDER — LABETALOL HCL 5 MG/ML IV SOLN: 10.0000 mg | INTRAVENOUS | Status: DC | PRN

## 2017-01-30 MED ORDER — VITAMIN C 500 MG PO TABS
250.0000 mg | ORAL_TABLET | Freq: Every day | ORAL | Status: DC
  Administered 2017-01-31 – 2017-02-02 (×3): 250 mg via ORAL
  Filled 2017-01-30 (×3): qty 1

## 2017-01-30 MED ORDER — PROPOFOL 10 MG/ML IV BOLUS
INTRAVENOUS | Status: DC | PRN
  Administered 2017-01-30: 200 mg via INTRAVENOUS

## 2017-01-30 MED ORDER — THROMBIN (RECOMBINANT) 5000 UNITS EX SOLR
CUTANEOUS | Status: AC
  Filled 2017-01-30: qty 5000

## 2017-01-30 MED ORDER — THROMBIN (RECOMBINANT) 5000 UNITS EX SOLR
CUTANEOUS | Status: DC | PRN
  Administered 2017-01-30: 19:00:00 via TOPICAL

## 2017-01-30 MED ORDER — SODIUM CHLORIDE 0.9 % IV SOLN
500.0000 mg | Freq: Two times a day (BID) | INTRAVENOUS | Status: DC
  Administered 2017-01-30 – 2017-02-01 (×4): 500 mg via INTRAVENOUS
  Filled 2017-01-30 (×5): qty 5

## 2017-01-30 MED ORDER — TURMERIC 500 MG PO CAPS: ORAL_CAPSULE | Freq: Every day | ORAL | Status: DC

## 2017-01-30 MED ORDER — SODIUM CHLORIDE 0.9 % IV SOLN
INTRAVENOUS | Status: DC
  Administered 2017-01-30: 21:00:00 via INTRAVENOUS

## 2017-01-30 MED ORDER — NALOXONE HCL 0.4 MG/ML IJ SOLN: 0.0800 mg | INTRAMUSCULAR | Status: DC | PRN

## 2017-01-30 MED ORDER — SUGAMMADEX SODIUM 500 MG/5ML IV SOLN
INTRAVENOUS | Status: AC
  Filled 2017-01-30: qty 5

## 2017-01-30 MED ORDER — LIDOCAINE-EPINEPHRINE 1 %-1:100000 IJ SOLN
INTRAMUSCULAR | Status: AC
  Filled 2017-01-30: qty 1

## 2017-01-30 MED ORDER — ROCURONIUM BROMIDE 10 MG/ML (PF) SYRINGE
PREFILLED_SYRINGE | INTRAVENOUS | Status: DC | PRN
  Administered 2017-01-30: 50 mg via INTRAVENOUS
  Administered 2017-01-30: 30 mg via INTRAVENOUS

## 2017-01-30 MED ORDER — ACETAMINOPHEN 325 MG PO TABS
650.0000 mg | ORAL_TABLET | ORAL | Status: DC | PRN
  Administered 2017-01-30: 650 mg via ORAL
  Filled 2017-01-30: qty 2

## 2017-01-30 MED ORDER — HYDROCODONE-ACETAMINOPHEN 5-325 MG PO TABS: 1.0000 | ORAL_TABLET | ORAL | Status: DC | PRN

## 2017-01-30 MED ORDER — SUGAMMADEX SODIUM 200 MG/2ML IV SOLN
INTRAVENOUS | Status: DC | PRN
  Administered 2017-01-30: 300 mg via INTRAVENOUS

## 2017-01-30 MED ORDER — FENTANYL CITRATE (PF) 250 MCG/5ML IJ SOLN
INTRAMUSCULAR | Status: AC
  Filled 2017-01-30: qty 5

## 2017-01-30 MED ORDER — ROCURONIUM BROMIDE 10 MG/ML (PF) SYRINGE
PREFILLED_SYRINGE | INTRAVENOUS | Status: AC
  Filled 2017-01-30: qty 5

## 2017-01-30 MED ORDER — DEXAMETHASONE SODIUM PHOSPHATE 10 MG/ML IJ SOLN
INTRAMUSCULAR | Status: AC
  Filled 2017-01-30: qty 1

## 2017-01-30 MED ORDER — PROPOFOL 10 MG/ML IV BOLUS
INTRAVENOUS | Status: AC
  Filled 2017-01-30: qty 20

## 2017-01-30 SURGICAL SUPPLY — 64 items
APL SKNCLS STERI-STRIP NONHPOA (GAUZE/BANDAGES/DRESSINGS)
BENZOIN TINCTURE PRP APPL 2/3 (GAUZE/BANDAGES/DRESSINGS) IMPLANT
BLADE CLIPPER SURG (BLADE) ×2 IMPLANT
BNDG CMPR 75X41 PLY ABS (GAUZE/BANDAGES/DRESSINGS) ×1
BNDG GAUZE ELAST 4 BULKY (GAUZE/BANDAGES/DRESSINGS) ×1 IMPLANT
BNDG STRETCH 4X75 NS LF (GAUZE/BANDAGES/DRESSINGS) ×1 IMPLANT
BUR ACORN 6.0 PRECISION (BURR) ×2 IMPLANT
BUR MATCHSTICK NEURO 3.0 LAGG (BURR) IMPLANT
BUR SPIRAL ROUTER 2.3 (BUR) IMPLANT
CANISTER SUCT 3000ML PPV (MISCELLANEOUS) ×2 IMPLANT
CARTRIDGE OIL MAESTRO DRILL (MISCELLANEOUS) ×1 IMPLANT
CLIP VESOCCLUDE MED 6/CT (CLIP) IMPLANT
DIFFUSER DRILL AIR PNEUMATIC (MISCELLANEOUS) ×2 IMPLANT
DRAIN SNY 7 FPER (WOUND CARE) ×1 IMPLANT
DRAPE NEUROLOGICAL W/INCISE (DRAPES) ×2 IMPLANT
DRAPE SURG 17X23 STRL (DRAPES) IMPLANT
DRAPE WARM FLUID 44X44 (DRAPE) ×2 IMPLANT
DRSG TELFA 3X8 NADH (GAUZE/BANDAGES/DRESSINGS) ×2 IMPLANT
DURAPREP 6ML APPLICATOR 50/CS (WOUND CARE) ×2 IMPLANT
ELECT REM PT RETURN 9FT ADLT (ELECTROSURGICAL) ×2
ELECTRODE REM PT RTRN 9FT ADLT (ELECTROSURGICAL) ×1 IMPLANT
EVACUATOR 1/8 PVC DRAIN (DRAIN) IMPLANT
EVACUATOR SILICONE 100CC (DRAIN) ×1 IMPLANT
GAUZE SPONGE 4X4 12PLY STRL (GAUZE/BANDAGES/DRESSINGS) ×2 IMPLANT
GAUZE SPONGE 4X4 16PLY XRAY LF (GAUZE/BANDAGES/DRESSINGS) IMPLANT
GLOVE BIO SURGEON STRL SZ7.5 (GLOVE) IMPLANT
GLOVE BIOGEL PI IND STRL 7.5 (GLOVE) ×2 IMPLANT
GLOVE BIOGEL PI INDICATOR 7.5 (GLOVE) ×2
GLOVE ECLIPSE 7.0 STRL STRAW (GLOVE) ×4 IMPLANT
GOWN STRL REUS W/ TWL LRG LVL3 (GOWN DISPOSABLE) ×2 IMPLANT
GOWN STRL REUS W/ TWL XL LVL3 (GOWN DISPOSABLE) IMPLANT
GOWN STRL REUS W/TWL 2XL LVL3 (GOWN DISPOSABLE) IMPLANT
GOWN STRL REUS W/TWL LRG LVL3 (GOWN DISPOSABLE) ×4
GOWN STRL REUS W/TWL XL LVL3 (GOWN DISPOSABLE) ×2
HEMOSTAT POWDER KIT SURGIFOAM (HEMOSTASIS) ×2 IMPLANT
HEMOSTAT SURGICEL 2X14 (HEMOSTASIS) ×1 IMPLANT
KIT BASIN OR (CUSTOM PROCEDURE TRAY) ×2 IMPLANT
KIT ROOM TURNOVER OR (KITS) ×2 IMPLANT
NEEDLE HYPO 22GX1.5 SAFETY (NEEDLE) ×2 IMPLANT
NS IRRIG 1000ML POUR BTL (IV SOLUTION) ×2 IMPLANT
OIL CARTRIDGE MAESTRO DRILL (MISCELLANEOUS) ×2
PACK CRANIOTOMY (CUSTOM PROCEDURE TRAY) ×2 IMPLANT
PAD DRESSING TELFA 3X8 NADH (GAUZE/BANDAGES/DRESSINGS) IMPLANT
PATTIES SURGICAL .5 X.5 (GAUZE/BANDAGES/DRESSINGS) IMPLANT
PATTIES SURGICAL .5 X3 (DISPOSABLE) IMPLANT
PATTIES SURGICAL 1X1 (DISPOSABLE) IMPLANT
PLATE 1.5  2HOLE LNG NEURO (Plate) ×2 IMPLANT
PLATE 1.5 2HOLE LNG NEURO (Plate) IMPLANT
PLATE 1.5/0.5 18.5MM BURR HOLE (Plate) ×1 IMPLANT
SCREW SELF DRILL HT 1.5/4MM (Screw) ×9 IMPLANT
SPONGE NEURO XRAY DETECT 1X3 (DISPOSABLE) IMPLANT
SPONGE SURGIFOAM ABS GEL 100 (HEMOSTASIS) ×3 IMPLANT
STAPLER VISISTAT 35W (STAPLE) ×2 IMPLANT
STOCKINETTE 6  STRL (DRAPES) ×1
STOCKINETTE 6 STRL (DRAPES) ×1 IMPLANT
SUT NURALON 4 0 TR CR/8 (SUTURE) ×6 IMPLANT
SUT VIC AB 0 CT1 18XCR BRD8 (SUTURE) ×2 IMPLANT
SUT VIC AB 0 CT1 8-18 (SUTURE) ×4
SUT VIC AB 3-0 SH 8-18 (SUTURE) ×4 IMPLANT
TOWEL GREEN STERILE (TOWEL DISPOSABLE) ×2 IMPLANT
TOWEL GREEN STERILE FF (TOWEL DISPOSABLE) ×2 IMPLANT
TRAY FOLEY W/METER SILVER 16FR (SET/KITS/TRAYS/PACK) ×2 IMPLANT
TUBE CONNECTING 12X1/4 (SUCTIONS) ×2 IMPLANT
WATER STERILE IRR 1000ML POUR (IV SOLUTION) ×2 IMPLANT

## 2017-01-30 NOTE — Anesthesia Postprocedure Evaluation (Signed)
Anesthesia Post Note  Patient: Anthony Banks  Procedure(s) Performed: CRANIOTOMY HEMATOMA EVACUATION SUBDURAL (N/A )     Patient location during evaluation: PACU Anesthesia Type: General Level of consciousness: sedated Pain management: pain level controlled Vital Signs Assessment: post-procedure vital signs reviewed and stable Respiratory status: spontaneous breathing and respiratory function stable Cardiovascular status: stable Postop Assessment: no apparent nausea or vomiting Anesthetic complications: no    Last Vitals:  Vitals:   01/30/17 1937 01/30/17 1942  BP: (!) 92/58 108/69  Pulse: 65 65  Resp: 19 20  Temp:  (!) 36.3 C  SpO2: 95% 94%    Last Pain:  Vitals:   01/30/17 1942  TempSrc:   PainSc: [place]

## 2017-01-30 NOTE — Anesthesia Preprocedure Evaluation (Addendum)
Anesthesia Evaluation  Patient identified by MRN, date of birth, ID band Patient awake    Reviewed: Allergy & Precautions, NPO status , Patient's Chart, lab work & pertinent test results  Airway Mallampati: II  TM Distance: >3 FB Neck ROM: Full    Dental  (+) Dental Advisory Given   Pulmonary neg pulmonary ROS,    breath sounds clear to auscultation       Cardiovascular negative cardio ROS   Rhythm:Regular Rate:Normal     Neuro/Psych Headaches: acute subdural hematoma., Subdural hematoma    GI/Hepatic negative GI ROS, Neg liver ROS,   Endo/Other  negative endocrine ROS  Renal/GU negative Renal ROS     Musculoskeletal  (+) Arthritis ,   Abdominal   Peds  Hematology negative hematology ROS (+)   Anesthesia Other Findings   Reproductive/Obstetrics                            Lab Results  Component Value Date   WBC 7.9 01/30/2017   HGB 16.7 01/30/2017   HCT 49.0 01/30/2017   MCV 94.3 01/30/2017   PLT 183 01/30/2017   Lab Results  Component Value Date   CREATININE 0.90 01/30/2017   BUN 14 01/30/2017   NA 142 01/30/2017   K 4.0 01/30/2017   CL 103 01/30/2017   CO2 27 01/30/2017    Anesthesia Physical Anesthesia Plan  ASA: III  Anesthesia Plan: General   Post-op Pain Management:    Induction: Intravenous  PONV Risk Score and Plan: 2 and Ondansetron, Dexamethasone and Treatment may vary due to age or medical condition  Airway Management Planned: Oral ETT  Additional Equipment: Arterial line  Intra-op Plan:   Post-operative Plan: Extubation in OR  Informed Consent: I have reviewed the patients History and Physical, chart, labs and discussed the procedure including the risks, benefits and alternatives for the proposed anesthesia with the patient or authorized representative who has indicated his/her understanding and acceptance.   Dental advisory given  Plan Discussed  with: CRNA  Anesthesia Plan Comments:        Anesthesia Quick Evaluation

## 2017-01-30 NOTE — ED Triage Notes (Signed)
Pt reports yesterday he had trouble lifting his right leg around noon, and h/a. This morning, woke up with trouble finding right leg, feels weak and numb. No pain. Denies slurred speech, facial droop. Weakness to right leg noted. No changes to vision.

## 2017-01-30 NOTE — Op Note (Signed)
PREOP DIAGNOSIS:  1. Left acute on chronic subdural hematoma  POSTOP DIAGNOSIS: Same  PROCEDURE: 1. Left craniotomy for evacuation of subdural hematoma  SURGEON: Dr. Consuella Lose, MD  ASSISTANT: Ferne Reus, PA-C  ANESTHESIA: General Endotracheal  EBL: 50cc  SPECIMENS: None  DRAINS: Subgaleal 7Fr JP  COMPLICATIONS: None immediate  CONDITION: Stable to PACU  HISTORY: Anthony Banks is a 66 y.o. male presenting to the emergency department with several weeks of progressively worsening headache, and approximately 1 day of right foot and leg weakness.  CT scan did demonstrate a approximately 2 cm left frontoparietal convexity acute on chronic subdural hematoma with significant mass-effect and midline shift.  Surgical evacuation was therefore indicated.  The risks and benefits of the surgery were explained in detail with the patient and his wife.  After all questions were answered informed consent was obtained and witnessed.  PROCEDURE IN DETAIL: After informed consent was obtained and witnessed, the patient was brought to the operating room. After induction of general anesthesia, the patient was positioned on the operative table in the supine position. All pressure points were meticulously padded. Skin incision was then marked out and prepped and draped in the usual sterile fashion.  After timeout was conducted, the skin incision was infiltrated with local anesthetic. Skin incision was then made sharply, and Bovie electrocautery was used to dissect the subcutaneous tissue and the galea was incised. Hemostasis was achieved on the skin edges with Raney clips. A single piece myocutaneous flap was then elevated and retracted anteriorly. Bur holes were then created and a frontal craniotomy was fashioned and elevated. Hemostasis was then achieved on the dural surface with bipolar electrocautery. The dura was then opened in cruciate fashion with monopolar electrocautery.  Immediately  underneath the dura there was a chronic subdural hematoma membrane which was opened.  Chronic subdural fluid was then immediately evacuated under some pressure.  There was also acute blood clots in the subdural space.  With a combination of suction and copious amounts of saline irrigation, the entire subdural space over the left hemisphere was irrigated until the irrigation ran clear.  Subdural space was then inspected and no further subdural hematoma was identified.  There was no active bleeding in the subdural space or from the brain parenchyma.  The dura was then reapproximated with interrupted 4-0 Nurolon stitches after a subdural drain was placed and tunneled subcutaneously.  A piece of Gelfoam was then placed over the dural surface.  Bone flap was then plated with standard titanium plates and screws.  Muscle was then closed using interrupted 0 Vicryl stitches, and the galea was closed using interrupted 3-0 Vicryl sutures. The skin was closed using standard surgical skin staples. Sterile dressing was then applied. The patient was then transferred to the stretcher and taken to the postanesthesia care unit in stable hemodynamic condition.  At the end of the case all sponge, needle, cottonoid and instrument counts were correct.

## 2017-01-30 NOTE — Transfer of Care (Signed)
Immediate Anesthesia Transfer of Care Note  Patient: Anthony Banks  Procedure(s) Performed: CRANIOTOMY HEMATOMA EVACUATION SUBDURAL (N/A )  Patient Location: PACU  Anesthesia Type:General  Level of Consciousness: awake, alert , oriented and patient cooperative  Airway & Oxygen Therapy: Patient Spontanous Breathing and Patient connected to nasal cannula oxygen  Post-op Assessment: Report given to RN, Post -op Vital signs reviewed and stable and Patient moving all extremities  Post vital signs: Reviewed and stable  Last Vitals:  Vitals:   01/30/17 1615 01/30/17 1929  BP: 131/82   Pulse: 67   Resp: 17   Temp:  (!) (P) 36.3 C  SpO2: 95%     Last Pain:  Vitals:   01/30/17 1929  TempSrc:   PainSc: (P) 0-No pain         Complications: No apparent anesthesia complications

## 2017-01-30 NOTE — ED Notes (Signed)
Neurosurgery pa at bedside.

## 2017-01-30 NOTE — Anesthesia Procedure Notes (Signed)
Arterial Line Insertion Start/End12/17/2018 7:00 PM, 01/30/2017 7:15 PM Performed by: Myna Bright, CRNA, CRNA  Patient location: Pre-op. Preanesthetic checklist: patient identified, IV checked, risks and benefits discussed, surgical consent, monitors and equipment checked and pre-op evaluation Lidocaine 1% used for infiltration Right, radial was placed Catheter size: 20 G Hand hygiene performed  and maximum sterile barriers used  Allen's test indicative of satisfactory collateral circulation Attempts: 1 Procedure performed without using ultrasound guided technique. Following insertion, Biopatch and dressing applied. Post procedure assessment: normal  Patient tolerated the procedure well with no immediate complications.

## 2017-01-30 NOTE — ED Provider Notes (Signed)
[facility] EMERGENCY DEPARTMENT Provider Note   CSN: [id number] Arrival date & time: 01/30/17  9937     History   Chief Complaint No chief complaint on file.   HPI Anthony Banks is a 66 y.o. male.  HPI  Patient presents with concern of right leg weakness, headache. Initially the patient states that his concern is that over the past 24 hours he has noticed right leg weakness, difficulty with ambulation, and driving. Eventually the patient notes that over the past month or so he has had intermittent headache, but no new weakness, no new vision changes. Given his development of new weakness he was sent here for evaluation. Patient states that at baseline he is generally well, denies substantial medical problems, does not take blood thinning medication. He has no history of trauma, fall.   Past Medical History:  Diagnosis Date  . Borderline hyperlipidemia   . Cancer ([place])    basal face Dr [name]  . Lumbar degenerative disc disease   . Nummular eczema   . Overweight   . Syncope     Patient Active Problem List   Diagnosis Date Noted  . Symptomatic bradycardia 11/29/2015  . Sinus bradycardia 11/29/2015  . Pure hypercholesterolemia     Past Surgical History:  Procedure Laterality Date  . basal/squamous cell face    . bilateral blepharoplasy    . COLONOSCOPY     polyps remove  . COLONOSCOPY WITH PROPOFOL N/A 11/24/2014   Procedure: COLONOSCOPY WITH PROPOFOL;  Surgeon: Garlan Fair, MD;  Location: [facility];  Service: Endoscopy;  Laterality: N/A;  . EP IMPLANTABLE DEVICE N/A 11/30/2015   Procedure: Pacemaker Implant;  Surgeon: Evans Lance, MD;  Location: [facility];  Service: Cardiovascular;  Laterality: N/A;  . L3 Gill procedure with L3 PLIF with interbody implant L3-4 posterlateral asthrodesis and posterior instrumentation of bone graft    . nasal polyps    . NASAL SEPTUM SURGERY    . VASECTOMY         Home Medications     Prior to Admission medications   Medication Sig Start Date End Date Taking? Authorizing Provider  Ascorbic Acid (VITAMIN C PO) Take 1 tablet by mouth daily.   Yes [provider]  glucosamine-chondroitin 500-400 MG tablet Take 2 tablets by mouth daily.   Yes [provider]  Menthol, Topical Analgesic, (BIOFREEZE EX) Apply 1 application topically as needed (Neck Pain).   Yes [provider]  TURMERIC PO Take 1 capsule by mouth daily.   Yes [provider]    Family History Family History  Problem Relation Age of Onset  . Asthma Mother   . Asthma Sister   . Cancer Sister        pancreatic    Social History Social History   Tobacco Use  . Smoking status: Never Smoker  . Smokeless tobacco: Never Used  Substance Use Topics  . Alcohol use: Yes    Alcohol/week: 0.6 - 1.2 oz    Types: 1 - 2 Cans of beer per week  . Drug use: No     Allergies   Aspirin; Morphine sulfate; and Nsaids   Review of Systems Review of Systems  Constitutional:       Per HPI, otherwise negative  HENT:       Per HPI, otherwise negative  Respiratory:       Per HPI, otherwise negative  Cardiovascular:       Per HPI,  otherwise negative  Gastrointestinal: Negative for vomiting.  Endocrine:       Negative aside from HPI  Genitourinary:       Neg aside from HPI   Musculoskeletal:       Per HPI, otherwise negative  Skin: Negative.   Neurological: Positive for weakness and headaches. Negative for syncope.     Physical Exam Updated Vital Signs BP (!) 138/95 (BP Location: Right Arm)   Pulse 64   Temp (!) 97.2 F (36.2 C) (Oral)   Resp 16   Ht 6\' 7"  (2.007 m)   Wt 113.4 kg (250 lb)   SpO2 97%   BMI 28.16 kg/m   Physical Exam  Constitutional: He is oriented to person, place, and time. He appears well-developed. No distress.  HENT:  Head: Normocephalic and atraumatic.  Eyes: Conjunctivae and EOM are normal.  Cardiovascular: Normal rate and regular  rhythm.  Pulmonary/Chest: Effort normal. No stridor. No respiratory distress.  Abdominal: He exhibits no distension.  Musculoskeletal: He exhibits no edema.  No gross deformities, musculoskeletal exam unremarkable.  Neurological: He is alert and oriented to person, place, and time.  Face is symmetric, speech is clear, upper extremity exam unremarkable, no discoordination. Right lower extremity is weaker than the left, with difficulty holding against gravity, difficulty with plantar flexion.  Skin: Skin is warm and dry.  Psychiatric: He has a normal mood and affect.  Nursing note and vitals reviewed.    ED Treatments / Results  Labs (all labs ordered are listed, but only abnormal results are displayed) Labs Reviewed  COMPREHENSIVE METABOLIC PANEL - Abnormal; Notable for the following components:      Result Value   Glucose, Bld 111 (*)    All other components within normal limits  I-STAT CHEM 8, ED - Abnormal; Notable for the following components:   Glucose, Bld 114 (*)    All other components within normal limits  PROTIME-INR  APTT  CBC  DIFFERENTIAL  I-STAT TROPONIN, ED  CBG MONITORING, ED     Radiology Ct Head Wo Contrast  Result Date: 01/30/2017 CLINICAL DATA:  Patient complains of RIGHT leg having no feeling since yesterday. Headache. EXAM: CT HEAD WITHOUT CONTRAST TECHNIQUE: Contiguous axial images were obtained from the base of the skull through the vertex without intravenous contrast. COMPARISON:  None. FINDINGS: Brain: There is a large extra-axial subdural hematoma over the LEFT convexity, maximum thickness of 19 mm. There are components of acute, subacute, and chronic hematoma. Hyperattenuating acute blood is most dependent, although a significant component of and is seen anteriorly. Isodense clot is notable over the superior most portions, with chronic hemorrhagic fluid located more laterally. There is significant LEFT-to-RIGHT shift measuring 6 mm. There is no  significant tentorial or interhemispheric component. There is no RIGHT-sided component. No parenchymal hemorrhage, visible stroke, hydrocephalus, or intra-axial mass. Vascular: No hyperdense vessel or unexpected calcification. Skull: Normal. Negative for fracture or focal lesion. Sinuses/Orbits: No acute finding. Chronic sinus disease. Previous BILATERAL maxillary sinus surgery. Other: None. IMPRESSION: Large acute, subacute, and chronic subdural hematoma over the LEFT convexity, almost 2 cm in thickness, with significant LEFT-to-RIGHT shift of up to 6 mm. Surgical consultation is warranted. Critical Value/emergent results were called by telephone at the time of interpretation on 01/30/2017 at 1:33 pm to Dr. Duffy Bruce , who verbally acknowledged these results. Electronically Signed   By: Staci Righter M.D.   On: 01/30/2017 13:36    Procedures Procedures (including critical care time)  Medications Ordered in  ED Medications - No data to display   Initial Impression / Assessment and Plan / ED Course  I have reviewed the triage vital signs and the nursing notes.  Pertinent labs & imaging results that were available during my care of the patient were reviewed by me and considered in my medical decision making (see chart for details).  Initial evaluation I reviewed the CT imaging, and then demonstrated the images themselves to the patient. Subsequently discussed patient's case with our neurosurgery team.  Update: Patient is being evaluated by the neurosurgical physician assistant. He remains hemodynamically stable, awake and alert, with clear speech.  This elderly male presents with recent headache, new right lower extremity weakness, is found to have a subdural hematoma, with characteristics consistent with both chronic and acute/subacute bleed. With new neurologic deficiency, we discussed his case with our neurosurgical team, patient was placed on continuous monitoring, and required admission  to the neurosurgical ICU for further monitoring and management.   Final Clinical Impressions(s) / ED Diagnoses  Subdural hemorrhage  CRITICAL CARE Performed by: Carmin Muskrat Total critical care time: 35 minutes Critical care time was exclusive of separately billable procedures and treating other patients. Critical care was necessary to treat or prevent imminent or life-threatening deterioration. Critical care was time spent personally by me on the following activities: development of treatment plan with patient and/or surrogate as well as nursing, discussions with consultants, evaluation of patient's response to treatment, examination of patient, obtaining history from patient or surrogate, ordering and performing treatments and interventions, ordering and review of laboratory studies, ordering and review of radiographic studies, pulse oximetry and re-evaluation of patient's condition.    Carmin Muskrat, MD 01/30/17 [phone]

## 2017-01-30 NOTE — Progress Notes (Signed)
Upon arrival to unit, pt's arterial line has a significant whip in the waveform and pt is continuously moving extremity. Basing BP off of the cuff pressure.  Guadalupe Maple, RN

## 2017-01-30 NOTE — H&P (Signed)
Chief Complaint   Right leg weakness, headache  HPI   HPI: Anthony Banks is a 66 y.o. male who presented to ER with chief complain of headache and right leg weakness. HA has been present for several months, but mild in severity (1-2/10). No true trauma, although works in [employer] work and will occasionally hit head on metal piece every so often but no significant injury sustained.Has been treating conservatively. No associated visual disturbances, photosensitivity, nausea, vomiting. Yesterday, noticed some right lower extremity weakness - difficulties driving and moving foot from gas to brake. This am woke up and right leg was numb/tingling and he had minimal strength. Came to ER for evaluation. Does have a history of chronic lower back issues treated conservatively several years ago with ESI. This does not feel similar to that. Denies bowel or bladder/dysfunction, RUE weakness or left sided weakness, facial droop, slurring speech. Healthy otherwise, not on anti-coag.    Patient Active Problem List   Diagnosis Date Noted  . Subdural hematoma ([place]) 01/30/2017  . Symptomatic bradycardia 11/29/2015  . Sinus bradycardia 11/29/2015  . Pure hypercholesterolemia     PMH: Past Medical History:  Diagnosis Date  . Borderline hyperlipidemia   . Cancer ([place])    basal face Dr [name]  . Lumbar degenerative disc disease   . Nummular eczema   . Overweight   . Syncope     PSH: Past Surgical History:  Procedure Laterality Date  . basal/squamous cell face    . bilateral blepharoplasy    . COLONOSCOPY     polyps remove  . COLONOSCOPY WITH PROPOFOL N/A 11/24/2014   Procedure: COLONOSCOPY WITH PROPOFOL;  Surgeon: Garlan Fair, MD;  Location: [facility];  Service: Endoscopy;  Laterality: N/A;  . EP IMPLANTABLE DEVICE N/A 11/30/2015   Procedure: Pacemaker Implant;  Surgeon: Evans Lance, MD;  Location: [facility];  Service: Cardiovascular;  Laterality: N/A;  . L3 Gill procedure with  L3 PLIF with interbody implant L3-4 posterlateral asthrodesis and posterior instrumentation of bone graft    . nasal polyps    . NASAL SEPTUM SURGERY    . VASECTOMY       (Not in a hospital admission)  SH: Social History   Tobacco Use  . Smoking status: Never Smoker  . Smokeless tobacco: Never Used  Substance Use Topics  . Alcohol use: Yes    Alcohol/week: 0.6 - 1.2 oz    Types: 1 - 2 Cans of beer per week  . Drug use: No    MEDS: Prior to Admission medications   Medication Sig Start Date End Date Taking? Authorizing Provider  Ascorbic Acid (VITAMIN C PO) Take 1 tablet by mouth daily.   Yes [provider]  glucosamine-chondroitin 500-400 MG tablet Take 2 tablets by mouth daily.   Yes [provider]  Menthol, Topical Analgesic, (BIOFREEZE EX) Apply 1 application topically as needed (Neck Pain).   Yes [provider]  TURMERIC PO Take 1 capsule by mouth daily.   Yes [provider]    ALLERGY: Allergies  Allergen Reactions  . Aspirin Hives and Other (See Comments)    Tight chest  . Morphine Sulfate Swelling  . Nsaids Hives and Other (See Comments)    Chest Pains    Social History   Tobacco Use  . Smoking status: Never Smoker  . Smokeless tobacco: Never Used  Substance Use Topics  . Alcohol use: Yes    Alcohol/week: 0.6 - 1.2 oz  Types: 1 - 2 Cans of beer per week     Family History  Problem Relation Age of Onset  . Asthma Mother   . Asthma Sister   . Cancer Sister        pancreatic     ROS   Review of Systems  HENT: Negative.   Eyes: Negative.   Respiratory: Negative.   Cardiovascular: Negative.   Gastrointestinal: Negative.   Genitourinary: Negative.   Musculoskeletal: Positive for neck pain. Negative for back pain, falls, joint pain and myalgias.  Skin: Negative.   Neurological: Positive for focal weakness (right lower extremity weakness) and headaches. Negative for dizziness, tingling, tremors, sensory  change, speech change, seizures and loss of consciousness.    Exam   Vitals:   01/30/17 1415 01/30/17 1445  BP: 114/73 (!) 160/97  Pulse: 63 70  Resp: 15 10  Temp:    SpO2: 96% 98%   General appearance: WDWN, resting comfortably, NAD Eyes: PERRL, Fundoscopic: normal Cardiovascular: Regular rate and rhythm without murmurs, rubs, gallops. No edema or variciosities. Distal pulses normal. Pulmonary: Clear to auscultation Musculoskeletal:     Gait: did not examine    Muscle tone upper extremities: Normal    Muscle tone lower extremities: Normal    Motor exam: Upper Extremities Deltoid Bicep Tricep Grip  Right 5/5 5/5 5/5 5/5  Left 5/5 5/5 5/5 5/5   Lower Extremity IP Quad PF DF EHL  Right 4/5 4/5 4/5 4/5 4/5  Left 5/5 5/5 5/5 5/5 5/5   Neurological Awake, alert, oriented x4 Memory and concentration grossly intact Speech fluent, appropriate CNII: Visual fields normal CNIII/IV/VI: EOMI CNV: Facial sensation normal CNVII: Symmetric, normal strength CNVIII: Grossly normal CNIX: Normal palate movement CNXI: Trap and SCM strength normal CN XII: Tongue protrusion normal Sensation grossly intact to LT DTR: Normal Coordination (finger/nose & heel/shin): Normal No pronator drift  Results - Imaging/Labs   Results for orders placed or performed during the hospital encounter of 01/30/17 (from the past 48 hour(s))  Protime-INR     Status: None   Collection Time: 01/30/17  9:53 AM  Result Value Ref Range   Prothrombin Time 13.1 11.4 - 15.2 seconds   INR 1.00   APTT     Status: None   Collection Time: 01/30/17  9:53 AM  Result Value Ref Range   aPTT 29 24 - 36 seconds  CBC     Status: None   Collection Time: 01/30/17  9:53 AM  Result Value Ref Range   WBC 7.9 4.0 - 10.5 K/uL   RBC 4.95 4.22 - 5.81 MIL/uL   Hemoglobin 15.8 13.0 - 17.0 g/dL   HCT 46.7 39.0 - 52.0 %   MCV 94.3 78.0 - 100.0 fL   MCH 31.9 26.0 - 34.0 pg   MCHC 33.8 30.0 - 36.0 g/dL   RDW 12.8 11.5 - 15.5 %    Platelets 183 150 - 400 K/uL  Differential     Status: None   Collection Time: 01/30/17  9:53 AM  Result Value Ref Range   Neutrophils Relative % 70 %   Neutro Abs 5.5 1.7 - 7.7 K/uL   Lymphocytes Relative 23 %   Lymphs Abs 1.8 0.7 - 4.0 K/uL   Monocytes Relative 5 %   Monocytes Absolute 0.4 0.1 - 1.0 K/uL   Eosinophils Relative 1 %   Eosinophils Absolute 0.1 0.0 - 0.7 K/uL   Basophils Relative 1 %   Basophils Absolute 0.0 0.0 - 0.1 K/uL  Comprehensive metabolic panel     Status: Abnormal   Collection Time: 01/30/17  9:53 AM  Result Value Ref Range   Sodium 139 135 - 145 mmol/L   Potassium 4.0 3.5 - 5.1 mmol/L   Chloride 104 101 - 111 mmol/L   CO2 27 22 - 32 mmol/L   Glucose, Bld 111 (H) 65 - 99 mg/dL   BUN 12 6 - 20 mg/dL   Creatinine, Ser 1.00 0.61 - 1.24 mg/dL   Calcium 10.1 8.9 - 10.3 mg/dL   Total Protein 7.0 6.5 - 8.1 g/dL   Albumin 3.7 3.5 - 5.0 g/dL   AST 26 15 - 41 U/L   ALT 28 17 - 63 U/L   Alkaline Phosphatase 49 38 - 126 U/L   Total Bilirubin 0.6 0.3 - 1.2 mg/dL   GFR calc non Af Amer >60 >60 mL/min   GFR calc Af Amer >60 >60 mL/min    Comment: (NOTE) The eGFR has been calculated using the CKD EPI equation. This calculation has not been validated in all clinical situations. eGFR's persistently <60 mL/min signify possible Chronic Kidney Disease.    Anion gap 8 5 - 15  I-stat troponin, ED     Status: None   Collection Time: 01/30/17 10:07 AM  Result Value Ref Range   Troponin i, poc 0.00 0.00 - 0.08 ng/mL   Comment 3            Comment: Due to the release kinetics of cTnI, a negative result within the first hours of the onset of symptoms does not rule out myocardial infarction with certainty. If myocardial infarction is still suspected, repeat the test at appropriate intervals.   I-Stat Chem 8, ED     Status: Abnormal   Collection Time: 01/30/17 10:09 AM  Result Value Ref Range   Sodium 142 135 - 145 mmol/L   Potassium 4.0 3.5 - 5.1 mmol/L    Chloride 103 101 - 111 mmol/L   BUN 14 6 - 20 mg/dL   Creatinine, Ser 0.90 0.61 - 1.24 mg/dL   Glucose, Bld 114 (H) 65 - 99 mg/dL   Calcium, Ion 1.25 1.15 - 1.40 mmol/L   TCO2 27 22 - 32 mmol/L   Hemoglobin 16.7 13.0 - 17.0 g/dL   HCT 49.0 39.0 - 52.0 %    Ct Head Wo Contrast  Result Date: 01/30/2017 CLINICAL DATA:  Patient complains of RIGHT leg having no feeling since yesterday. Headache. EXAM: CT HEAD WITHOUT CONTRAST TECHNIQUE: Contiguous axial images were obtained from the base of the skull through the vertex without intravenous contrast. COMPARISON:  None. FINDINGS: Brain: There is a large extra-axial subdural hematoma over the LEFT convexity, maximum thickness of 19 mm. There are components of acute, subacute, and chronic hematoma. Hyperattenuating acute blood is most dependent, although a significant component of and is seen anteriorly. Isodense clot is notable over the superior most portions, with chronic hemorrhagic fluid located more laterally. There is significant LEFT-to-RIGHT shift measuring 6 mm. There is no significant tentorial or interhemispheric component. There is no RIGHT-sided component. No parenchymal hemorrhage, visible stroke, hydrocephalus, or intra-axial mass. Vascular: No hyperdense vessel or unexpected calcification. Skull: Normal. Negative for fracture or focal lesion. Sinuses/Orbits: No acute finding. Chronic sinus disease. Previous BILATERAL maxillary sinus surgery. Other: None. IMPRESSION: Large acute, subacute, and chronic subdural hematoma over the LEFT convexity, almost 2 cm in thickness, with significant LEFT-to-RIGHT shift of up to 6 mm. Surgical consultation is warranted. Critical Value/emergent results were  called by telephone at the time of interpretation on 01/30/2017 at 1:33 pm to Dr. Duffy Bruce , who verbally acknowledged these results. Electronically Signed   By: Staci Righter M.D.   On: 01/30/2017 13:36    Impression/Plan   65 y.o. male with large  mixed acute/subacute/chronic left subdural hematoma roughly 2cm in thickness with 20m midline shift. He is neurologically intact with the exception of RLE weakness as noted above. Case reviewed with attending Dr NKathyrn Sheriff Patient will need to undergo craniotomy for evacuation of SDH. I have discussed risks, benefits and alternatives to surgery. He states in own language understanding of the procedure and wishes to proceed. Scheduled for later today. Dr NKathyrn Sheriffto see patient to further discuss the above.  - Admit to Neuro ICU post operatively - Keppra for seizure prophylaxis

## 2017-01-30 NOTE — Anesthesia Procedure Notes (Signed)
Procedure Name: Intubation Date/Time: 01/30/2017 5:47 PM Performed by: Barrington Ellison, CRNA Pre-anesthesia Checklist: Patient identified, Emergency Drugs available, Suction available and Patient being monitored Patient Re-evaluated:Patient Re-evaluated prior to induction Oxygen Delivery Method: Circle System Utilized Preoxygenation: Pre-oxygenation with 100% oxygen Induction Type: IV induction Ventilation: Mask ventilation without difficulty Laryngoscope Size: Mac and 4 Grade View: Grade I Tube type: Oral Number of attempts: 1 Airway Equipment and Method: Stylet and Oral airway Placement Confirmation: ETT inserted through vocal cords under direct vision,  positive ETCO2 and breath sounds checked- equal and bilateral Secured at: 23 cm Tube secured with: Tape Dental Injury: Teeth and Oropharynx as per pre-operative assessment

## 2017-01-31 ENCOUNTER — Encounter (HOSPITAL_COMMUNITY): Payer: Self-pay | Admitting: Neurosurgery

## 2017-01-31 ENCOUNTER — Inpatient Hospital Stay (HOSPITAL_COMMUNITY): Payer: Medicare HMO

## 2017-01-31 LAB — TYPE AND SCREEN
ABO/RH(D): O POS
ANTIBODY SCREEN: NEGATIVE
Unit division: 0
Unit division: 0

## 2017-01-31 LAB — BPAM RBC
Blood Product Expiration Date: 201901102359
Blood Product Expiration Date: 201901102359
UNIT TYPE AND RH: 5100
Unit Type and Rh: 5100

## 2017-02-01 MED ORDER — LEVETIRACETAM 500 MG PO TABS
500.0000 mg | ORAL_TABLET | Freq: Two times a day (BID) | ORAL | Status: DC
  Administered 2017-02-01 – 2017-02-02 (×2): 500 mg via ORAL
  Filled 2017-02-01 (×2): qty 1

## 2017-02-01 MED ORDER — PANTOPRAZOLE SODIUM 40 MG PO TBEC
40.0000 mg | DELAYED_RELEASE_TABLET | Freq: Every day | ORAL | Status: DC
  Administered 2017-02-01: 40 mg via ORAL
  Filled 2017-02-01: qty 1

## 2017-02-01 NOTE — Progress Notes (Signed)
Pt seen and examined. No issues overnight. Reports minimal HA, right leg strength significantly improved. Ambulating without difficulty  EXAM: Temp:  [98.2 F (36.8 C)-98.8 F (37.1 C)] 98.3 F (36.8 C) (12/19 1200) Pulse Rate:  [62-80] 65 (12/19 0900) Resp:  [13-19] 17 (12/19 0900) BP: (100-143)/(68-98) 122/86 (12/19 0900) SpO2:  [93 %-100 %] 95 % (12/19 0900) Intake/Output      12/18 0701 - 12/19 0700 12/19 0701 - 12/20 0700   P.O.  480   I.V. (mL/kg) 40 (0.4)    Other 40    IV Piggyback 310    Total Intake(mL/kg) 390 (3.4) 480 (4.2)   Urine (mL/kg/hr) 0 (0)    Drains 50    Stool 0    Blood     Total Output 50    Net +340 +480        Urine Occurrence 3 x 1 x   Stool Occurrence 1 x 1 x    Awake, alert, oriented Speech fluent CN intact Good strength throughout JP in place, >100cc x 24 hrs  LABS: Lab Results  Component Value Date   CREATININE 0.90 01/30/2017   BUN 14 01/30/2017   NA 142 01/30/2017   K 4.0 01/30/2017   CL 103 01/30/2017   CO2 27 01/30/2017   Lab Results  Component Value Date   WBC 7.9 01/30/2017   HGB 16.7 01/30/2017   HCT 49.0 01/30/2017   MCV 94.3 01/30/2017   PLT 183 01/30/2017    IMAGING: CTH reviewed with near complete resolution of midline shift, improvement in left SDH.  IMPRESSION: - 66 y.o. male POD#1 s/p left crani for SDH evacuation with resolution of RLE weakness  PLAN: - Cont to observe in ICU - Will keep drain today

## 2017-02-01 NOTE — Progress Notes (Signed)
Pt seen and examined. No issues overnight.   EXAM: Temp:  [98.2 F (36.8 C)-98.8 F (37.1 C)] 98.3 F (36.8 C) (12/19 1200) Pulse Rate:  [62-80] 65 (12/19 0900) Resp:  [13-19] 17 (12/19 0900) BP: (100-143)/(68-98) 122/86 (12/19 0900) SpO2:  [93 %-100 %] 95 % (12/19 0900) Intake/Output      12/18 0701 - 12/19 0700 12/19 0701 - 12/20 0700   P.O.  480   I.V. (mL/kg) 40 (0.4)    Other 40    IV Piggyback 310    Total Intake(mL/kg) 390 (3.4) 480 (4.2)   Urine (mL/kg/hr) 0 (0)    Drains 50    Stool 0    Blood     Total Output 50    Net +340 +480        Urine Occurrence 3 x 1 x   Stool Occurrence 1 x 1 x    Awake, alert, oriented Speech fluent CN intact 5/5 throughout JP in place, <50cc x 24hrs  LABS: Lab Results  Component Value Date   CREATININE 0.90 01/30/2017   BUN 14 01/30/2017   NA 142 01/30/2017   K 4.0 01/30/2017   CL 103 01/30/2017   CO2 27 01/30/2017   Lab Results  Component Value Date   WBC 7.9 01/30/2017   HGB 16.7 01/30/2017   HCT 49.0 01/30/2017   MCV 94.3 01/30/2017   PLT 183 01/30/2017    IMPRESSION: - 66 y.o. male POD#2 s/p left crani for SDH, doing well  PLAN: - JP d/ced at bedside - Observe in ICU - Cont to ambulate as tolerated - Likely d/c home tomorrow

## 2017-02-01 NOTE — Progress Notes (Addendum)
0700 Bedside shift report. Pt sleeping, easy to arouse. NAD, no complaints. Pt's dressing to head, CDI, JP drain with serosanguineous fluid in bulb. VSS, fall precautions in place, WCTM.   0800 Pt assessed, see flowsheet. NAD, denies pain, awaiting MD. RN on standby assist as pt walked around unit.   1000 Pt sitting up in chair, visiting with friend, pt medicated. WCTM.   63 Dr. Kathyrn Sheriff at bedside, removed dressing to head and JP drain without difficulty. 2 staples inserted to top of scalp. Pt c/o pain, refuses pain meds, instead wants to get up and walk. RN assisted pt up and ambulating hallways.   66 Pt visiting with family at bedside, no needs or complaints at this time.   1800 Pt resting comfortably, NAD, no complaints, possible discharge tomorrow per Dr. Kathyrn Sheriff.

## 2017-02-02 MED ORDER — LEVETIRACETAM 500 MG PO TABS: 500.0000 mg | ORAL_TABLET | Freq: Two times a day (BID) | ORAL | 0 refills | Status: DC

## 2017-02-02 NOTE — Progress Notes (Signed)
All d/c instructions given to pt. Surgical dressing removed. Script for keppra given. Pt wheeled to discharge area by NT, assisted into wife's car.

## 2017-02-02 NOTE — Discharge Summary (Signed)
  Physician Discharge Summary  Patient ID: Anthony Banks MRN: [id number] DOB/AGE: May 29, 1950 66 y.o.  Admit date: 01/30/2017 Discharge date: 02/02/2017  Admission Diagnoses:  Subdural hematoma, left  Discharge Diagnoses:  Same Active Problems:   Subdural hematoma [facility])   Discharged Condition: Stable  Hospital Course:  Anthony Banks is a 66 y.o. male admitted after evacuation of subdural hematoma. He returned to baseline postop with normal right leg and foot strength. JP was removed on POD#2, and he remained stable.   Treatments: Surgery - left crani for evac of SDH  Discharge Exam: Blood pressure 100/66, pulse 61, temperature 97.8 F (36.6 C), temperature source Oral, resp. rate 17, height 6\' 7"  (2.007 m), weight 113.4 kg (250 lb), SpO2 96 %. Awake, alert, oriented Speech fluent, appropriate CN grossly intact 5/5 BUE/BLE Wound c/d/i  Disposition: 01-Home or Self Care  Discharge Instructions    Call MD for:  redness, tenderness, or signs of infection (pain, swelling, redness, odor or green/yellow discharge around incision site)   Complete by:  As directed    Call MD for:  temperature >100.4   Complete by:  As directed    Diet - low sodium heart healthy   Complete by:  As directed    Discharge instructions   Complete by:  As directed    Walk at home as much as possible, at least 4 times / day   Increase activity slowly   Complete by:  As directed    Lifting restrictions   Complete by:  As directed    No lifting > 10 lbs   May shower / Bathe   Complete by:  As directed    48 hours after surgery   May walk up steps   Complete by:  As directed    No dressing needed   Complete by:  As directed    Other Restrictions   Complete by:  As directed    No bending/twisting at waist     Allergies as of 02/02/2017      Reactions   Aspirin Hives, Other (See Comments)   Tight chest   Morphine Sulfate Swelling   Nsaids Hives, Other (See Comments)   Chest Pains       Medication List    STOP taking these medications   TURMERIC PO     TAKE these medications   BIOFREEZE EX Apply 1 application topically as needed (Neck Pain).   glucosamine-chondroitin 500-400 MG tablet Take 2 tablets by mouth daily.   levETIRAcetam 500 MG tablet Commonly known as:  KEPPRA Take 1 tablet (500 mg total) by mouth 2 (two) times daily.   VITAMIN C PO Take 1 tablet by mouth daily.      Follow-up Information    Consuella Lose, MD Follow up in 2 week(s).   Specialty:  Neurosurgery Contact information: [address] [place] [address] [phone]           Signed: Jairo Ben 02/02/2017, 9:51 AM

## 2017-02-02 NOTE — Progress Notes (Signed)
No issues overnight. No complaints, minimal HA. Walking well  EXAM:  BP 100/66   Pulse 61   Temp 97.8 F (36.6 C) (Oral)   Resp 17   Ht 6\' 7"  (2.007 m)   Wt 113.4 kg (250 lb)   SpO2 96%   BMI 28.16 kg/m   Awake, alert, oriented  Speech fluent, appropriate  CN grossly intact  5/5 BUE/BLE   IMPRESSION:  66 y.o. male POD# 3 s/p left crani for SDH, doing well. At baseline  PLAN: - d/c home today

## 2017-02-08 DIAGNOSIS — R202 Paresthesia of skin: Secondary | ICD-10-CM | POA: Diagnosis not present

## 2017-02-14 DIAGNOSIS — R569 Unspecified convulsions: Secondary | ICD-10-CM

## 2017-02-14 HISTORY — DX: Unspecified convulsions: R56.9

## 2017-02-24 ENCOUNTER — Encounter: Payer: Self-pay | Admitting: Internal Medicine

## 2017-02-28 DIAGNOSIS — Z0100 Encounter for examination of eyes and vision without abnormal findings: Secondary | ICD-10-CM | POA: Diagnosis not present

## 2017-02-28 DIAGNOSIS — H524 Presbyopia: Secondary | ICD-10-CM | POA: Diagnosis not present

## 2017-02-28 DIAGNOSIS — H52223 Regular astigmatism, bilateral: Secondary | ICD-10-CM | POA: Diagnosis not present

## 2017-02-28 DIAGNOSIS — H5213 Myopia, bilateral: Secondary | ICD-10-CM | POA: Diagnosis not present

## 2017-03-01 ENCOUNTER — Other Ambulatory Visit: Payer: Self-pay | Admitting: Physician Assistant

## 2017-03-01 ENCOUNTER — Ambulatory Visit
Admission: RE | Admit: 2017-03-01 | Discharge: 2017-03-01 | Disposition: A | Discharge status: Home / Self Care | Payer: Medicare HMO | Source: Ambulatory Visit | Attending: Physician Assistant | Admitting: Physician Assistant

## 2017-03-01 DIAGNOSIS — I6200 Nontraumatic subdural hemorrhage, unspecified: Secondary | ICD-10-CM

## 2017-03-02 ENCOUNTER — Emergency Department (HOSPITAL_COMMUNITY): Payer: Medicare HMO

## 2017-03-02 ENCOUNTER — Other Ambulatory Visit: Payer: Self-pay

## 2017-03-02 ENCOUNTER — Encounter (HOSPITAL_COMMUNITY): Payer: Self-pay

## 2017-03-02 ENCOUNTER — Inpatient Hospital Stay (HOSPITAL_COMMUNITY)
Admission: EM | Admit: 2017-03-02 | Discharge: 2017-03-11 | DRG: 026 | Disposition: A | Discharge status: Home / Self Care | Payer: Medicare HMO | Attending: Neurological Surgery | Admitting: Neurological Surgery

## 2017-03-02 DIAGNOSIS — R4702 Dysphasia: Secondary | ICD-10-CM | POA: Diagnosis present

## 2017-03-02 DIAGNOSIS — N179 Acute kidney failure, unspecified: Secondary | ICD-10-CM | POA: Diagnosis not present

## 2017-03-02 DIAGNOSIS — I6200 Nontraumatic subdural hemorrhage, unspecified: Secondary | ICD-10-CM | POA: Diagnosis not present

## 2017-03-02 DIAGNOSIS — Z950 Presence of cardiac pacemaker: Secondary | ICD-10-CM

## 2017-03-02 DIAGNOSIS — Z885 Allergy status to narcotic agent status: Secondary | ICD-10-CM | POA: Diagnosis not present

## 2017-03-02 DIAGNOSIS — I6203 Nontraumatic chronic subdural hemorrhage: Principal | ICD-10-CM | POA: Diagnosis present

## 2017-03-02 DIAGNOSIS — I499 Cardiac arrhythmia, unspecified: Secondary | ICD-10-CM | POA: Diagnosis present

## 2017-03-02 DIAGNOSIS — Z6834 Body mass index (BMI) 34.0-34.9, adult: Secondary | ICD-10-CM

## 2017-03-02 DIAGNOSIS — R2990 Unspecified symptoms and signs involving the nervous system: Secondary | ICD-10-CM

## 2017-03-02 DIAGNOSIS — Z79899 Other long term (current) drug therapy: Secondary | ICD-10-CM | POA: Diagnosis not present

## 2017-03-02 DIAGNOSIS — R569 Unspecified convulsions: Secondary | ICD-10-CM | POA: Diagnosis present

## 2017-03-02 DIAGNOSIS — G9349 Other encephalopathy: Secondary | ICD-10-CM | POA: Diagnosis not present

## 2017-03-02 DIAGNOSIS — R4701 Aphasia: Secondary | ICD-10-CM | POA: Diagnosis not present

## 2017-03-02 DIAGNOSIS — R9401 Abnormal electroencephalogram [EEG]: Secondary | ICD-10-CM | POA: Diagnosis not present

## 2017-03-02 DIAGNOSIS — R29810 Facial weakness: Secondary | ICD-10-CM | POA: Diagnosis not present

## 2017-03-02 DIAGNOSIS — R471 Dysarthria and anarthria: Secondary | ICD-10-CM | POA: Diagnosis not present

## 2017-03-02 DIAGNOSIS — Z886 Allergy status to analgesic agent status: Secondary | ICD-10-CM

## 2017-03-02 DIAGNOSIS — I6789 Other cerebrovascular disease: Secondary | ICD-10-CM | POA: Diagnosis not present

## 2017-03-02 DIAGNOSIS — E7800 Pure hypercholesterolemia, unspecified: Secondary | ICD-10-CM | POA: Diagnosis not present

## 2017-03-02 DIAGNOSIS — Z85828 Personal history of other malignant neoplasm of skin: Secondary | ICD-10-CM | POA: Diagnosis not present

## 2017-03-02 DIAGNOSIS — E663 Overweight: Secondary | ICD-10-CM | POA: Diagnosis present

## 2017-03-02 DIAGNOSIS — E785 Hyperlipidemia, unspecified: Secondary | ICD-10-CM | POA: Diagnosis present

## 2017-03-02 DIAGNOSIS — G459 Transient cerebral ischemic attack, unspecified: Secondary | ICD-10-CM | POA: Diagnosis not present

## 2017-03-02 DIAGNOSIS — G8191 Hemiplegia, unspecified affecting right dominant side: Secondary | ICD-10-CM | POA: Diagnosis not present

## 2017-03-02 DIAGNOSIS — S065X0A Traumatic subdural hemorrhage without loss of consciousness, initial encounter: Secondary | ICD-10-CM | POA: Diagnosis not present

## 2017-03-02 DIAGNOSIS — R404 Transient alteration of awareness: Secondary | ICD-10-CM | POA: Diagnosis not present

## 2017-03-02 DIAGNOSIS — R001 Bradycardia, unspecified: Secondary | ICD-10-CM | POA: Diagnosis not present

## 2017-03-02 HISTORY — DX: Traumatic subdural hemorrhage with loss of consciousness status unknown, initial encounter: S06.5XAA

## 2017-03-02 HISTORY — DX: Traumatic subdural hemorrhage with loss of consciousness of unspecified duration, initial encounter: S06.5X9A

## 2017-03-02 LAB — COMPREHENSIVE METABOLIC PANEL
ALT: 15 U/L — ABNORMAL LOW (ref 17–63)
ANION GAP: 11 (ref 5–15)
AST: 16 U/L (ref 15–41)
Albumin: 3.8 g/dL (ref 3.5–5.0)
Alkaline Phosphatase: 50 U/L (ref 38–126)
BUN: 18 mg/dL (ref 6–20)
CHLORIDE: 104 mmol/L (ref 101–111)
CO2: 22 mmol/L (ref 22–32)
Calcium: 9.4 mg/dL (ref 8.9–10.3)
Creatinine, Ser: 1.1 mg/dL (ref 0.61–1.24)
GFR calc non Af Amer: >60 mL/min (ref >60)
Glucose, Bld: 98 mg/dL (ref 65–99)
Potassium: 4.2 mmol/L (ref 3.5–5.1)
SODIUM: 137 mmol/L (ref 135–145)
Total Bilirubin: 0.8 mg/dL (ref 0.3–1.2)
Total Protein: 7.3 g/dL (ref 6.5–8.1)

## 2017-03-02 LAB — URINALYSIS, ROUTINE W REFLEX MICROSCOPIC
BILIRUBIN URINE: NEGATIVE
Glucose, UA: NEGATIVE mg/dL
HGB URINE DIPSTICK: NEGATIVE
Ketones, ur: NEGATIVE mg/dL
Leukocytes, UA: NEGATIVE
Nitrite: NEGATIVE
Protein, ur: NEGATIVE mg/dL
SPECIFIC GRAVITY, URINE: 1.033 — AB (ref 1.005–1.030)
pH: 5 (ref 5.0–8.0)

## 2017-03-02 LAB — I-STAT CHEM 8, ED
BUN: 21 mg/dL — ABNORMAL HIGH (ref 6–20)
CALCIUM ION: 1.12 mmol/L — AB (ref 1.15–1.40)
CHLORIDE: 103 mmol/L (ref 101–111)
Creatinine, Ser: 1 mg/dL (ref 0.61–1.24)
Glucose, Bld: 98 mg/dL (ref 65–99)
HCT: 42 % (ref 39.0–52.0)
Hemoglobin: 14.3 g/dL (ref 13.0–17.0)
Potassium: 4.2 mmol/L (ref 3.5–5.1)
Sodium: 139 mmol/L (ref 135–145)
TCO2: 24 mmol/L (ref 22–32)

## 2017-03-02 LAB — RAPID URINE DRUG SCREEN, HOSP PERFORMED
AMPHETAMINES: NOT DETECTED
BENZODIAZEPINES: NOT DETECTED
Barbiturates: NOT DETECTED
Cocaine: NOT DETECTED
OPIATES: NOT DETECTED
Tetrahydrocannabinol: NOT DETECTED

## 2017-03-02 LAB — CBC
HCT: 42.5 % (ref 39.0–52.0)
Hemoglobin: 14.2 g/dL (ref 13.0–17.0)
MCH: 31.4 pg (ref 26.0–34.0)
MCHC: 33.4 g/dL (ref 30.0–36.0)
MCV: 94 fL (ref 78.0–100.0)
PLATELETS: 174 10*3/uL (ref 150–400)
RBC: 4.52 MIL/uL (ref 4.22–5.81)
RDW: 12.3 % (ref 11.5–15.5)
WBC: 7.8 10*3/uL (ref 4.0–10.5)

## 2017-03-02 LAB — DIFFERENTIAL
BASOS PCT: 0 %
Basophils Absolute: 0 10*3/uL (ref 0.0–0.1)
EOS PCT: 2 %
Eosinophils Absolute: 0.1 10*3/uL (ref 0.0–0.7)
Lymphocytes Relative: 24 %
Lymphs Abs: 1.8 10*3/uL (ref 0.7–4.0)
MONO ABS: 0.6 10*3/uL (ref 0.1–1.0)
Monocytes Relative: 8 %
NEUTROS ABS: 5.2 10*3/uL (ref 1.7–7.7)
NEUTROS PCT: 66 %

## 2017-03-02 LAB — PROTIME-INR
INR: 1.05
PROTHROMBIN TIME: 13.6 s (ref 11.4–15.2)

## 2017-03-02 LAB — APTT: aPTT: 31 seconds (ref 24–36)

## 2017-03-02 LAB — ETHANOL

## 2017-03-02 LAB — I-STAT TROPONIN, ED: Troponin i, poc: 0 ng/mL (ref 0.00–0.08)

## 2017-03-02 MED ORDER — IOPAMIDOL (ISOVUE-370) INJECTION 76%
INTRAVENOUS | Status: AC
  Administered 2017-03-02: 50 mL
  Filled 2017-03-02: qty 50

## 2017-03-02 MED ORDER — LEVETIRACETAM 500 MG PO TABS
1000.0000 mg | ORAL_TABLET | Freq: Two times a day (BID) | ORAL | Status: DC
  Administered 2017-03-02 – 2017-03-11 (×18): 1000 mg via ORAL
  Filled 2017-03-02 (×20): qty 2

## 2017-03-02 MED ORDER — SODIUM CHLORIDE 0.9 % IV SOLN
INTRAVENOUS | Status: DC
  Administered 2017-03-02: 19:00:00 via INTRAVENOUS

## 2017-03-02 NOTE — ED Provider Notes (Signed)
Emergency Department Provider Note   I have reviewed the triage vital signs and the nursing notes.   HISTORY  Chief Complaint Code Stroke   HPI Anthony Banks is a 67 y.o. male with PMH of subdural hematoma, HLD, and syncope resents to the emergency department for evaluation of right-sided weakness and slurred speech.  The patient was playing golf when the symptoms began shortly prior to ED presentation.  No history of similar symptoms.  Patient does have history of subdural hematoma which was evacuated on 01/30/17.  Since symptom onset the patient feels like his weakness and slurred speech are resolving.  He feels almost back to normal.  He was activated as a code stroke in the field and evaluated by neurology upon arrival. No radiation of symptoms. No modifying factors.   Past Medical History:  Diagnosis Date  . Borderline hyperlipidemia   . Cancer ([place])    basal face Dr [name]  . Lumbar degenerative disc disease   . Nummular eczema   . Overweight   . Subdural hematoma ([place])   . Syncope     Patient Active Problem List   Diagnosis Date Noted  . Seizure ([place]) 03/02/2017  . Subdural hematoma ([place]) 01/30/2017  . Symptomatic bradycardia 11/29/2015  . Sinus bradycardia 11/29/2015  . Pure hypercholesterolemia     Past Surgical History:  Procedure Laterality Date  . basal/squamous cell face    . bilateral blepharoplasy    . COLONOSCOPY     polyps remove  . COLONOSCOPY WITH PROPOFOL N/A 11/24/2014   Procedure: COLONOSCOPY WITH PROPOFOL;  Surgeon: Garlan Fair, MD;  Location: [facility];  Service: Endoscopy;  Laterality: N/A;  . CRANIOTOMY N/A 01/30/2017   Procedure: CRANIOTOMY HEMATOMA EVACUATION SUBDURAL;  Surgeon: Consuella Lose, MD;  Location: [facility];  Service: Neurosurgery;  Laterality: N/A;  CRANIOTOMY HEMATOMA EVACUATION SUBDURAL  . EP IMPLANTABLE DEVICE N/A 11/30/2015   Procedure: Pacemaker Implant;  Surgeon: Evans Lance, MD;  Location: [place] CV  LAB;  Service: Cardiovascular;  Laterality: N/A;  . L3 Gill procedure with L3 PLIF with interbody implant L3-4 posterlateral asthrodesis and posterior instrumentation of bone graft    . nasal polyps    . NASAL SEPTUM SURGERY    . VASECTOMY        Allergies Aspirin; Morphine sulfate; and Nsaids  Family History  Problem Relation Age of Onset  . Asthma Mother   . Asthma Sister   . Cancer Sister        pancreatic    Social History Social History   Tobacco Use  . Smoking status: Never Smoker  . Smokeless tobacco: Never Used  Substance Use Topics  . Alcohol use: Yes    Alcohol/week: 0.6 - 1.2 oz    Types: 1 - 2 Cans of beer per week  . Drug use: No    Review of Systems  Constitutional: No fever/chills Eyes: No visual changes. ENT: No sore throat. Cardiovascular: Denies chest pain. Respiratory: Denies shortness of breath. Gastrointestinal: No abdominal pain.  No nausea, no vomiting.  No diarrhea.  No constipation. Genitourinary: Negative for dysuria. Musculoskeletal: Negative for back pain. Skin: Negative for rash. Neurological: Negative for headaches. Positive right sided weakness/numbness with slurred speech.   10-point ROS otherwise negative.  ____________________________________________   PHYSICAL EXAM:  VITAL SIGNS: ED Triage Vitals  Enc Vitals Group     BP 03/02/17 1510 124/90     Pulse Rate 03/02/17 1510 67     Resp --  Temp 03/02/17 1514 98.6 F (37 C)     Temp src --      SpO2 03/02/17 1437 99 %     Weight 03/02/17 1442 254 lb 6.6 oz (115.4 kg)     Height 03/02/17 1442 6' (1.829 m)     Pain Score 03/02/17 1441 0    Constitutional: Alert and oriented. Well appearing and in no acute distress. Eyes: Conjunctivae are normal. PERRL. EOMI. Head: Atraumatic. Nose: No congestion/rhinnorhea. Mouth/Throat: Mucous membranes are moist.  Oropharynx non-erythematous. Neck: No stridor.   Cardiovascular: Normal rate, regular rhythm. Good peripheral  circulation. Grossly normal heart sounds.   Respiratory: Normal respiratory effort.  No retractions. Lungs CTAB. Gastrointestinal: Soft and nontender. No distention.  Musculoskeletal: No lower extremity tenderness nor edema. No gross deformities of extremities. Neurologic:  Normal speech and language. No gross focal neurologic deficits are appreciated. Normal CN exam 2-12. No pronator drift.  Skin:  Skin is warm, dry and intact. No rash noted.  ____________________________________________   LABS (all labs ordered are listed, but only abnormal results are displayed)  Labs Reviewed  COMPREHENSIVE METABOLIC PANEL - Abnormal; Notable for the following components:      Result Value   ALT 15 (*)    All other components within normal limits  URINALYSIS, ROUTINE W REFLEX MICROSCOPIC - Abnormal; Notable for the following components:   Specific Gravity, Urine 1.033 (*)    All other components within normal limits  I-STAT CHEM 8, ED - Abnormal; Notable for the following components:   BUN 21 (*)    Calcium, Ion 1.12 (*)    All other components within normal limits  ETHANOL  PROTIME-INR  APTT  CBC  DIFFERENTIAL  RAPID URINE DRUG SCREEN, HOSP PERFORMED  COMPREHENSIVE METABOLIC PANEL  CBC  I-STAT TROPONIN, ED   ____________________________________________  EKG   EKG Interpretation  Date/Time:  Thursday March 02 2017 15:11:03 EST Ventricular Rate:  68 PR Interval:    QRS Duration: 101 QT Interval:  403 QTC Calculation: 429 R Axis:   -7 Text Interpretation:  Sinus rhythm Prolonged PR interval Abnormal R-wave progression, early transition No STEMI.  Confirmed by Nanda Quinton [phone]) on 03/02/2017 3:20:39 PM       ____________________________________________  RADIOLOGY  Ct Angio Head W Or Wo Contrast  Result Date: 03/02/2017 CLINICAL DATA:  67 y/o male code stroke. Right side weakness. Left subdural hematoma status post surgical evacuation in December EXAM: CT ANGIOGRAPHY HEAD  AND NECK TECHNIQUE: Multidetector CT imaging of the head and neck was performed using the standard protocol during bolus administration of intravenous contrast. Multiplanar CT image reconstructions and MIPs were obtained to evaluate the vascular anatomy. Carotid stenosis measurements (when applicable) are obtained utilizing NASCET criteria, using the distal internal carotid diameter as the denominator. CONTRAST:  1mL ISOVUE-370 IOPAMIDOL (ISOVUE-370) INJECTION 76% COMPARISON:  Head CT without contrast 1447 hr today, and earlier FINDINGS: CTA NECK Skeleton: Sequelae of left frontal craniotomy. No acute osseous abnormality identified. Upper chest: Partially visible left chest cardiac pacemaker type device. Negative lung parenchyma. No superior mediastinal lymphadenopathy. Other neck: Negative.  No cervical lymphadenopathy. Aortic arch: 3 vessel arch configuration, no arch atherosclerosis. Right carotid system: Negative. Left carotid system: Negative. Vertebral arteries: No proximal right subclavian artery stenosis or plaque. Normal right vertebral artery origin. No cervical right vertebral artery stenosis. Minimal soft plaque in the proximal left subclavian artery without stenosis. Mild calcified plaque at the left vertebral artery origin without stenosis (series 8 image 122). Mildly non  dominant left vertebral artery. Mild V1 segment tortuosity. No stenosis to the skull base. CTA HEAD Posterior circulation: Codominant distal vertebral arteries. Normal PICA origins. Tortuous V4 segments without stenosis. Patent vertebrobasilar junction. Patent basilar artery without stenosis. Normal SCA and PCA origins. Posterior communicating arteries are diminutive or absent. Bilateral PCA branches are somewhat diminutive but otherwise normal. No stenosis identified. Anterior circulation: Patent ICA siphons. No siphon plaque or stenosis. Patent carotid termini. Normal MCA and ACA origins. Anterior communicating artery and bilateral  ACA branches are within normal limits. Left MCA M1 segment, bifurcation, and left MCA branches are within normal limits. Right MCA M1 segment, bifurcation, and right MCA branches are within normal limits. Venous sinuses: Not well evaluated due to early contrast timing. The superior sagittal sinus, dominant right transverse and sigmoid sinuses appear patent. Anatomic variants: None. Other findings: Mixed density left subdural hematoma redemonstrated with no contrast extravasation or CTA spot sign identified. Review of the MIP images confirms the above findings IMPRESSION: 1. Negative for emergent large vessel occlusion 2. Minimal atherosclerosis in the head and neck. No arterial stenosis. 3. Mixed density left subdural hematoma appears stable since the head CT 1447 hr today. Electronically Signed   By: Genevie Ann M.D.   On: 03/02/2017 16:45   Ct Angio Neck W Or Wo Contrast  Result Date: 03/02/2017 CLINICAL DATA:  67 y/o male code stroke. Right side weakness. Left subdural hematoma status post surgical evacuation in December EXAM: CT ANGIOGRAPHY HEAD AND NECK TECHNIQUE: Multidetector CT imaging of the head and neck was performed using the standard protocol during bolus administration of intravenous contrast. Multiplanar CT image reconstructions and MIPs were obtained to evaluate the vascular anatomy. Carotid stenosis measurements (when applicable) are obtained utilizing NASCET criteria, using the distal internal carotid diameter as the denominator. CONTRAST:  72mL ISOVUE-370 IOPAMIDOL (ISOVUE-370) INJECTION 76% COMPARISON:  Head CT without contrast 1447 hr today, and earlier FINDINGS: CTA NECK Skeleton: Sequelae of left frontal craniotomy. No acute osseous abnormality identified. Upper chest: Partially visible left chest cardiac pacemaker type device. Negative lung parenchyma. No superior mediastinal lymphadenopathy. Other neck: Negative.  No cervical lymphadenopathy. Aortic arch: 3 vessel arch configuration, no arch  atherosclerosis. Right carotid system: Negative. Left carotid system: Negative. Vertebral arteries: No proximal right subclavian artery stenosis or plaque. Normal right vertebral artery origin. No cervical right vertebral artery stenosis. Minimal soft plaque in the proximal left subclavian artery without stenosis. Mild calcified plaque at the left vertebral artery origin without stenosis (series 8 image 122). Mildly non dominant left vertebral artery. Mild V1 segment tortuosity. No stenosis to the skull base. CTA HEAD Posterior circulation: Codominant distal vertebral arteries. Normal PICA origins. Tortuous V4 segments without stenosis. Patent vertebrobasilar junction. Patent basilar artery without stenosis. Normal SCA and PCA origins. Posterior communicating arteries are diminutive or absent. Bilateral PCA branches are somewhat diminutive but otherwise normal. No stenosis identified. Anterior circulation: Patent ICA siphons. No siphon plaque or stenosis. Patent carotid termini. Normal MCA and ACA origins. Anterior communicating artery and bilateral ACA branches are within normal limits. Left MCA M1 segment, bifurcation, and left MCA branches are within normal limits. Right MCA M1 segment, bifurcation, and right MCA branches are within normal limits. Venous sinuses: Not well evaluated due to early contrast timing. The superior sagittal sinus, dominant right transverse and sigmoid sinuses appear patent. Anatomic variants: None. Other findings: Mixed density left subdural hematoma redemonstrated with no contrast extravasation or CTA spot sign identified. Review of the MIP images confirms the above  findings IMPRESSION: 1. Negative for emergent large vessel occlusion 2. Minimal atherosclerosis in the head and neck. No arterial stenosis. 3. Mixed density left subdural hematoma appears stable since the head CT 1447 hr today. Electronically Signed   By: Genevie Ann M.D.   On: 03/02/2017 16:45   Ct Head Code Stroke Wo  Contrast  Result Date: 03/02/2017 CLINICAL DATA:  Code stroke. RIGHT-sided weakness beginning today. Recent subdural hematoma. EXAM: CT HEAD WITHOUT CONTRAST TECHNIQUE: Contiguous axial images were obtained from the base of the skull through the vertex without intravenous contrast. COMPARISON:  CT HEAD March 01, 2017 FINDINGS: BRAIN: No acute large vascular territory infarct. Similar LEFT frontoparietal heterogeneously hypodense subdural hematoma measuring to 12 mm with apparent membrane formation. Similar mass effect, 3 mm LEFT to RIGHT midline shift. Mild mass effect on LEFT lateral ventricle without entrapment. No intraparenchymal hemorrhage. Basal cisterns remain patent. VASCULAR: Unremarkable. SKULL: No skull fracture. Status post LEFT frontal craniotomy. Mild residual LEFT frontal scalp soft tissue swelling without subcutaneous gas or radiopaque foreign bodies. SINUSES/ORBITS: Moderate lobulated paranasal sinusitis, status post FESS. Mastoid air cells are well aerated. Included ocular globes and orbital contents are non-suspicious. OTHER: None. ASPECTS [facility] Stroke Program Early CT Score) - Ganglionic level infarction (caudate, lentiform nuclei, internal capsule, insula, M1-M3 cortex): 7 - Supraganglionic infarction (M4-M6 cortex): 3 Total score (0-10 with 10 being normal): 10 IMPRESSION: 1. No acute intracranial process. 2. Stable mixed density LEFT frontoparietal subdural hematoma with 3 mm LEFT-to-RIGHT midline shift. 3. ASPECTS is 10. Acute findings text paged to Dr.Lindzen, Neurology via [employer] secure system on 03/02/2017 at 2:55 pm. Electronically Signed   By: Elon Alas M.D.   On: 03/02/2017 14:59    ____________________________________________   PROCEDURES  Procedure(s) performed:   Procedures  None ____________________________________________   INITIAL IMPRESSION / ASSESSMENT AND PLAN / ED COURSE  Pertinent labs & imaging results that were available during my care of the  patient were reviewed by me and considered in my medical decision making (see chart for details).  Patient presents to the emergency department as a code stroke with right-sided weakness and slurred speech.  Symptoms are resolving significantly since onset.  Plan to discuss case with neurology and likely admit for stroke evaluation.  Spoke with Dr. Cheral Marker who recommends NSG consultation. CT shows continued hematoma but improved from CT yesterday. Low suspicion for seizure given clinical history. Will discuss with NSG.   NSG has seen and left recommendations. Patient admitted to medicine team for further evaluation and monitoring.   Discussed patient's case with Hospitalist to request admission. Patient and family (if present) updated with plan. Care transferred to Hospitalist service.  I reviewed all nursing notes, vitals, pertinent old records, EKGs, labs, imaging (as available).  ____________________________________________  FINAL CLINICAL IMPRESSION(S) / ED DIAGNOSES  Final diagnoses:  Stroke-like symptoms     MEDICATIONS GIVEN DURING THIS VISIT:  Medications  levETIRAcetam (KEPPRA) tablet 1,000 mg (1,000 mg Oral Given 03/02/17 2240)  0.9 %  sodium chloride infusion ( Intravenous Transfusing/Transfer 03/02/17 2030)  iopamidol (ISOVUE-370) 76 % injection (50 mLs  Contrast Given 03/02/17 1500)     Note:  This document was prepared using Dragon voice recognition software and may include unintentional dictation errors.  Nanda Quinton, MD Emergency Medicine   Zackerie Sara, [facility], MD 03/02/17 [phone]

## 2017-03-02 NOTE — Consult Note (Signed)
NEURO HOSPITALIST CONSULT NOTE   Requesting physician: Dr. Laverta Baltimore  Reason for Consult: Acute onset of right facial droop and dysphasia with RUE weakness   History obtained from:  Patient and Chart     HPI:                                                                                                                                          Anthony Banks is an 67 y.o. male who was playing golf today when he had sudden onset of right upper extremity clumsiness/weakness (dropped objects) and difficulty speaking. Symptoms started at 1340 (LKN = TOSO). He drove himself home and then called EMS. When EMS arrived he still had some expressive aphasia but it was improving. EMS noted him to have a facial droop on right side with some drooling and was still having some minor dysarthria. He was AXOX4 with VSS.   He has a history of spontaneous subdural hematoma and is s/p craniotomy with hematoma evacuation in December. He had a follow up CT yesterday for the subdural.   CT head from yesterday: Left hemispheric subdural hematoma over the convexity, slightly larger in the parietal region. Multiple septations. No new or acute hemorrhage. 3 mm midline shift to the right similar to the prior study.  STAT CT head after arrival with EMS:  1. No acute intracranial process. 2. Stable mixed density LEFT frontoparietal subdural hematoma with 3 mm LEFT-to-RIGHT midline shift. 3. ASPECTS is 10.  CTA head and neck: Official report pending. No LVO on preliminary review.   Past Medical History:  Diagnosis Date  . Borderline hyperlipidemia   . Cancer ([place])    basal face Dr [name]  . Lumbar degenerative disc disease   . Nummular eczema   . Overweight   . Subdural hematoma ([place])   . Syncope     Past Surgical History:  Procedure Laterality Date  . basal/squamous cell face    . bilateral blepharoplasy    . COLONOSCOPY     polyps remove  . COLONOSCOPY WITH PROPOFOL N/A 11/24/2014    Procedure: COLONOSCOPY WITH PROPOFOL;  Surgeon: Garlan Fair, MD;  Location: [facility];  Service: Endoscopy;  Laterality: N/A;  . CRANIOTOMY N/A 01/30/2017   Procedure: CRANIOTOMY HEMATOMA EVACUATION SUBDURAL;  Surgeon: Consuella Lose, MD;  Location: [facility];  Service: Neurosurgery;  Laterality: N/A;  CRANIOTOMY HEMATOMA EVACUATION SUBDURAL  . EP IMPLANTABLE DEVICE N/A 11/30/2015   Procedure: Pacemaker Implant;  Surgeon: Evans Lance, MD;  Location: [facility];  Service: Cardiovascular;  Laterality: N/A;  . L3 Gill procedure with L3 PLIF with interbody implant L3-4 posterlateral asthrodesis and posterior instrumentation of bone graft    . nasal polyps    . NASAL SEPTUM SURGERY    .  VASECTOMY      Family History  Problem Relation Age of Onset  . Asthma Mother   . Asthma Sister   . Cancer Sister        pancreatic   Social History:  reports that  has never smoked. he has never used smokeless tobacco. He reports that he drinks about 0.6 - 1.2 oz of alcohol per week. He reports that he does not use drugs.  Allergies  Allergen Reactions  . Aspirin Hives and Other (See Comments)    Tight chest  . Morphine Sulfate Swelling  . Nsaids Hives and Other (See Comments)    Chest Pains    HOME MEDICATIONS:                                                                                                                     Keppra 500 mg BID Menthol topical Glucosamine-chondroitin Vitamin C  ROS:                                                                                                                                       As per HPI. No other complaints.   Height 6' (1.829 m), weight 115.4 kg (254 lb 6.6 oz), SpO2 99 %.  General Examination:                                                                                                      HEENT- [place]/AT  Lungs- Respirations unlabored Ext: Warm and well-perfused  Neurological Examination Mental Status: Alert, fully  oriented. Mild dysarthria. Subtle expressive dysphasia with hesitant speech and occasional word finding difficulty; however, no errors of grammar or syntax. Comprehension intact. Repetition intact.  Cranial Nerves: II: Visual fields intact all 4 quadrants with no extinction to DSS.   III,IV, VI: EOMI with note made of saccadic visual pursuit to right. No nystagmus. No ptosis.  V,VII: Subtle right facial droop. Some saliva at corner of mouth on the right.  VIII: hearing intact to conversation  IX,X: No hoarseness. Palate elevates normally.  XI: Symmetric shoulder shrug XII: midline tongue extension Motor: Right : Upper extremity   5/5    Left:     Upper extremity   5/5  Lower extremity   5/5     Lower extremity   5/5 No pronator drift Sensory: Temp and light touch intact proximally x 4. No extinction.  Deep Tendon Reflexes:  2+ upper extremities.  2+ right patella, 1+ left patella, 0 achilles.  Toes downgoing bilaterally. Cerebellar: No ataxia with FNF bilaterally.  Gait: Deferred due to falls risk concerns   Lab Results: Basic Metabolic Panel: Recent Labs  Lab 03/02/17 1445  NA 139  K 4.2  CL 103  GLUCOSE 98  BUN 21*  CREATININE 1.00    Liver Function Tests: No results for input(s): AST, ALT, ALKPHOS, BILITOT, PROT, ALBUMIN in the last 168 hours. No results for input(s): LIPASE, AMYLASE in the last 168 hours. No results for input(s): AMMONIA in the last 168 hours.  CBC: Recent Labs  Lab 03/02/17 1444 03/02/17 1445  WBC 7.8  --   NEUTROABS 5.2  --   HGB 14.2 14.3  HCT 42.5 42.0  MCV 94.0  --   PLT 174  --     Cardiac Enzymes: No results for input(s): CKTOTAL, CKMB, CKMBINDEX, TROPONINI in the last 168 hours.  Lipid Panel: No results for input(s): CHOL, TRIG, HDL, CHOLHDL, VLDL, LDLCALC in the last 168 hours.  CBG: No results for input(s): GLUCAP in the last 168 hours.  Microbiology: Results for orders placed or performed during the hospital encounter of  01/30/17  MRSA PCR Screening     Status: None   Collection Time: 01/30/17  8:13 PM  Result Value Ref Range Status   MRSA by PCR NEGATIVE NEGATIVE Final    Comment:        The GeneXpert MRSA Assay (FDA approved for NASAL specimens only), is one component of a comprehensive MRSA colonization surveillance program. It is not intended to diagnose MRSA infection nor to guide or monitor treatment for MRSA infections.     Coagulation Studies: No results for input(s): LABPROT, INR in the last 72 hours.  Imaging: Ct Head Wo Contrast  Result Date: 03/01/2017 CLINICAL DATA:  Subdural hemorrhage followup EXAM: CT HEAD WITHOUT CONTRAST TECHNIQUE: Contiguous axial images were obtained from the base of the skull through the vertex without intravenous contrast. COMPARISON:  CT 01/31/2017 FINDINGS: Brain: Left convexity subdural fluid collection mildly increased in size, now measuring 17 mm in thickness in the parietal region compared with 11 mm previously. Left frontal fluid collection similar in size measuring 11 mm. Decreased high density blood within the collection. Interval development of multiple septations in the fluid. Mass-effect on the left parietal lobe with 3 mm midline shift similar to the prior study. Negative for hydrocephalus. No acute infarct or mass. Vascular: Negative for hyperdense vessel Skull: Left frontal craniotomy. Sinuses/Orbits: Prior sinus surgery. Mucosal edema paranasal sinuses. Normal orbit. Other: None IMPRESSION: Left hemispheric subdural hematoma over the convexity, slightly larger in the parietal region. Multiple septations. No new or acute hemorrhage. 3 mm midline shift to the right similar to the prior study. Electronically Signed   By: Franchot Gallo M.D.   On: 03/01/2017 10:51   Ct Head Code Stroke Wo Contrast  Result Date: 03/02/2017 CLINICAL DATA:  Code stroke. RIGHT-sided weakness beginning today. Recent subdural hematoma. EXAM: CT HEAD WITHOUT CONTRAST TECHNIQUE:  Contiguous axial images were obtained from the base  of the skull through the vertex without intravenous contrast. COMPARISON:  CT HEAD March 01, 2017 FINDINGS: BRAIN: No acute large vascular territory infarct. Similar LEFT frontoparietal heterogeneously hypodense subdural hematoma measuring to 12 mm with apparent membrane formation. Similar mass effect, 3 mm LEFT to RIGHT midline shift. Mild mass effect on LEFT lateral ventricle without entrapment. No intraparenchymal hemorrhage. Basal cisterns remain patent. VASCULAR: Unremarkable. SKULL: No skull fracture. Status post LEFT frontal craniotomy. Mild residual LEFT frontal scalp soft tissue swelling without subcutaneous gas or radiopaque foreign bodies. SINUSES/ORBITS: Moderate lobulated paranasal sinusitis, status post FESS. Mastoid air cells are well aerated. Included ocular globes and orbital contents are non-suspicious. OTHER: None. ASPECTS [facility] Stroke Program Early CT Score) - Ganglionic level infarction (caudate, lentiform nuclei, internal capsule, insula, M1-M3 cortex): 7 - Supraganglionic infarction (M4-M6 cortex): 3 Total score (0-10 with 10 being normal): 10 IMPRESSION: 1. No acute intracranial process. 2. Stable mixed density LEFT frontoparietal subdural hematoma with 3 mm LEFT-to-RIGHT midline shift. 3. ASPECTS is 10. Acute findings text paged to Dr.Rashawn Rolon, Neurology via AMION secure system on 03/02/2017 at 2:55 pm. Electronically Signed   By: Elon Alas M.D.   On: 03/02/2017 14:59   Assessment: 67 year old male presenting with dysphasia, right facial droop and RUE weakness 1. Symptoms almost completely resolved. Residual mild dysarthria and expressive dysphasia noted on exam. Symptoms most likely secondary to mass effect from the large left subdural hematoma. 2. CTA head and neck preliminary review: No LVO. Await official radiology report 3. The patient has a pacemaker. Unable to perform MRI.   Recommendations: 1. Neurosurgery  consult 2. Continue Keppra 3. EEG.   Electronically signed: Dr. Kerney Elbe 03/02/2017, 3:11 PM

## 2017-03-02 NOTE — Consult Note (Signed)
Chief Complaint   Chief Complaint  Patient presents with  . Code Stroke    HPI   HPI: Anthony Banks is a 67 y.o. male who presented to ER as code stroke. Approx 1345 today, he was finishing up playing golf when he had acute onset right sided weakness and slurring speech. By the time he arrived to ER per wife at 1500, symptoms had resolved. He currently feels well now. Minimal frontal headache. Denies motor/sensory deficits currently.  History is significant for spontaneous SDH in December requiring craniotomy for evacuation by Dr Kathyrn Sheriff. He was just seen yesterday in clinic by Dr Kathyrn Sheriff due to mild headaches with head CT (similar when compared to CT from today). Neuro intact so advised to continue to monitor.  Patient Active Problem List   Diagnosis Date Noted  . Subdural hematoma ([place]) 01/30/2017  . Symptomatic bradycardia 11/29/2015  . Sinus bradycardia 11/29/2015  . Pure hypercholesterolemia     PMH: Past Medical History:  Diagnosis Date  . Borderline hyperlipidemia   . Cancer ([place])    basal face Dr [name]  . Lumbar degenerative disc disease   . Nummular eczema   . Overweight   . Subdural hematoma ([place])   . Syncope     PSH: Past Surgical History:  Procedure Laterality Date  . basal/squamous cell face    . bilateral blepharoplasy    . COLONOSCOPY     polyps remove  . COLONOSCOPY WITH PROPOFOL N/A 11/24/2014   Procedure: COLONOSCOPY WITH PROPOFOL;  Surgeon: Garlan Fair, MD;  [facility];  Service: Endoscopy;  Laterality: N/A;  . CRANIOTOMY N/A 01/30/2017   Procedure: CRANIOTOMY HEMATOMA EVACUATION SUBDURAL;  Surgeon: Consuella Lose, MD;  Location: [facility];  Service: Neurosurgery;  Laterality: N/A;  CRANIOTOMY HEMATOMA EVACUATION SUBDURAL  . EP IMPLANTABLE DEVICE N/A 11/30/2015   Procedure: Pacemaker Implant;  Surgeon: Evans Lance, MD;  Location: [facility];  Service: Cardiovascular;  Laterality: N/A;  . L3 Gill procedure with L3  PLIF with interbody implant L3-4 posterlateral asthrodesis and posterior instrumentation of bone graft    . nasal polyps    . NASAL SEPTUM SURGERY    . VASECTOMY       (Not in a hospital admission)  SH: Social History   Tobacco Use  . Smoking status: Never Smoker  . Smokeless tobacco: Never Used  Substance Use Topics  . Alcohol use: Yes    Alcohol/week: 0.6 - 1.2 oz    Types: 1 - 2 Cans of beer per week  . Drug use: No    MEDS: Prior to Admission medications   Medication Sig Start Date End Date Taking? Authorizing Provider  acetaminophen (TYLENOL) 325 MG tablet Take 325-650 mg by mouth every 6 (six) hours as needed (for pain).   Yes [provider]  Ascorbic Acid (VITAMIN C PO) Take 1 tablet by mouth daily.   Yes [provider]  glucosamine-chondroitin 500-400 MG tablet Take 2 tablets by mouth daily.   Yes [provider]  levETIRAcetam (KEPPRA) 500 MG tablet Take 1 tablet (500 mg total) by mouth 2 (two) times daily. 02/02/17  Yes Consuella Lose, MD  Menthol, Topical Analgesic, (BIOFREEZE EX) Apply 1 application topically as needed (Neck Pain).   Yes [provider]    ALLERGY: Allergies  Allergen Reactions  . Aspirin Hives and Other (See Comments)    Tight chest  . Morphine Sulfate Anaphylaxis and Swelling    Swelling of the throat  .  Nsaids Hives and Other (See Comments)    Chest Pains    Social History   Tobacco Use  . Smoking status: Never Smoker  . Smokeless tobacco: Never Used  Substance Use Topics  . Alcohol use: Yes    Alcohol/week: 0.6 - 1.2 oz    Types: 1 - 2 Cans of beer per week     Family History  Problem Relation Age of Onset  . Asthma Mother   . Asthma Sister   . Cancer Sister        pancreatic     ROS   Review of Systems  Constitutional: Negative.   HENT: Negative.   Eyes: Negative.   Respiratory: Negative.   Gastrointestinal: Negative.   Musculoskeletal: Negative.   Neurological: Positive  for sensory change, speech change (resolved), focal weakness (right sided - resolved) and headaches. Negative for dizziness, tingling, tremors and loss of consciousness.    Exam   Vitals:   03/02/17 1600 03/02/17 1615  BP: (!) 128/101 (!) 129/102  Pulse: 98 98  Resp: 19 15  Temp:    SpO2: 98% 100%   General appearance: WDWN, NAD, resting comfortably Eyes: PERRL, Fundoscopic: normal Cardiovascular: Regular rate and rhythm without murmurs, rubs, gallops. No edema or variciosities. Distal pulses normal. Pulmonary: Clear to auscultation Musculoskeletal:     Muscle tone upper extremities: Normal    Muscle tone lower extremities: Normal    Motor exam: Upper Extremities Deltoid Bicep Tricep Grip  Right 5/5 5/5 5/5 5/5  Left 5/5 5/5 5/5 5/5   Lower Extremity IP Quad PF DF EHL  Right 5/5 5/5 5/5 5/5 5/5  Left 5/5 5/5 5/5 5/5 5/5   Neurological Awake, alert, oriented Memory and concentration grossly intact Hesitant at times with speech, but overall no slurring CNII: Visual fields normal CNIII/IV/VI: EOMI CNV: Facial sensation normal CNVII: Symmetric, normal strength CNVIII: Grossly normal CNIX: Normal palate movement CNXI: Trap and SCM strength normal CN XII: Tongue protrusion normal Sensation grossly intact to LT DTR: Normal Coordination (finger/nose & heel/shin): Normal  Results - Imaging/Labs   Results for orders placed or performed during the hospital encounter of 03/02/17 (from the past 48 hour(s))  I-stat troponin, ED     Status: None   Collection Time: 03/02/17  2:43 PM  Result Value Ref Range   Troponin i, poc 0.00 0.00 - 0.08 ng/mL   Comment 3            Comment: Due to the release kinetics of cTnI, a negative result within the first hours of the onset of symptoms does not rule out myocardial infarction with certainty. If myocardial infarction is still suspected, repeat the test at appropriate intervals.   Ethanol     Status: None   Collection Time:  03/02/17  2:44 PM  Result Value Ref Range   Alcohol, Ethyl (B) <10 <10 mg/dL    Comment:        LOWEST DETECTABLE LIMIT FOR SERUM ALCOHOL IS 10 mg/dL FOR MEDICAL PURPOSES ONLY   Protime-INR     Status: None   Collection Time: 03/02/17  2:44 PM  Result Value Ref Range   Prothrombin Time 13.6 11.4 - 15.2 seconds   INR 1.05   APTT     Status: None   Collection Time: 03/02/17  2:44 PM  Result Value Ref Range   aPTT 31 24 - 36 seconds  CBC     Status: None   Collection Time: 03/02/17  2:44 PM  Result  Value Ref Range   WBC 7.8 4.0 - 10.5 K/uL   RBC 4.52 4.22 - 5.81 MIL/uL   Hemoglobin 14.2 13.0 - 17.0 g/dL   HCT 42.5 39.0 - 52.0 %   MCV 94.0 78.0 - 100.0 fL   MCH 31.4 26.0 - 34.0 pg   MCHC 33.4 30.0 - 36.0 g/dL   RDW 12.3 11.5 - 15.5 %   Platelets 174 150 - 400 K/uL  Differential     Status: None   Collection Time: 03/02/17  2:44 PM  Result Value Ref Range   Neutrophils Relative % 66 %   Neutro Abs 5.2 1.7 - 7.7 K/uL   Lymphocytes Relative 24 %   Lymphs Abs 1.8 0.7 - 4.0 K/uL   Monocytes Relative 8 %   Monocytes Absolute 0.6 0.1 - 1.0 K/uL   Eosinophils Relative 2 %   Eosinophils Absolute 0.1 0.0 - 0.7 K/uL   Basophils Relative 0 %   Basophils Absolute 0.0 0.0 - 0.1 K/uL  Comprehensive metabolic panel     Status: Abnormal   Collection Time: 03/02/17  2:44 PM  Result Value Ref Range   Sodium 137 135 - 145 mmol/L   Potassium 4.2 3.5 - 5.1 mmol/L   Chloride 104 101 - 111 mmol/L   CO2 22 22 - 32 mmol/L   Glucose, Bld 98 65 - 99 mg/dL   BUN 18 6 - 20 mg/dL   Creatinine, Ser 1.10 0.61 - 1.24 mg/dL   Calcium 9.4 8.9 - 10.3 mg/dL   Total Protein 7.3 6.5 - 8.1 g/dL   Albumin 3.8 3.5 - 5.0 g/dL   AST 16 15 - 41 U/L   ALT 15 (L) 17 - 63 U/L   Alkaline Phosphatase 50 38 - 126 U/L   Total Bilirubin 0.8 0.3 - 1.2 mg/dL   GFR calc non Af Amer >60 >60 mL/min   GFR calc Af Amer >60 >60 mL/min    Comment: (NOTE) The eGFR has been calculated using the CKD EPI equation. This  calculation has not been validated in all clinical situations. eGFR's persistently <60 mL/min signify possible Chronic Kidney Disease.    Anion gap 11 5 - 15  I-Stat Chem 8, ED     Status: Abnormal   Collection Time: 03/02/17  2:45 PM  Result Value Ref Range   Sodium 139 135 - 145 mmol/L   Potassium 4.2 3.5 - 5.1 mmol/L   Chloride 103 101 - 111 mmol/L   BUN 21 (H) 6 - 20 mg/dL   Creatinine, Ser 1.00 0.61 - 1.24 mg/dL   Glucose, Bld 98 65 - 99 mg/dL   Calcium, Ion 1.12 (L) 1.15 - 1.40 mmol/L   TCO2 24 22 - 32 mmol/L   Hemoglobin 14.3 13.0 - 17.0 g/dL   HCT 42.0 39.0 - 52.0 %    Ct Head Wo Contrast  Result Date: 03/01/2017 CLINICAL DATA:  Subdural hemorrhage followup EXAM: CT HEAD WITHOUT CONTRAST TECHNIQUE: Contiguous axial images were obtained from the base of the skull through the vertex without intravenous contrast. COMPARISON:  CT 01/31/2017 FINDINGS: Brain: Left convexity subdural fluid collection mildly increased in size, now measuring 17 mm in thickness in the parietal region compared with 11 mm previously. Left frontal fluid collection similar in size measuring 11 mm. Decreased high density blood within the collection. Interval development of multiple septations in the fluid. Mass-effect on the left parietal lobe with 3 mm midline shift similar to the prior study. Negative for hydrocephalus. No  acute infarct or mass. Vascular: Negative for hyperdense vessel Skull: Left frontal craniotomy. Sinuses/Orbits: Prior sinus surgery. Mucosal edema paranasal sinuses. Normal orbit. Other: None IMPRESSION: Left hemispheric subdural hematoma over the convexity, slightly larger in the parietal region. Multiple septations. No new or acute hemorrhage. 3 mm midline shift to the right similar to the prior study. Electronically Signed   By: Franchot Gallo M.D.   On: 03/01/2017 10:51   Ct Head Code Stroke Wo Contrast  Result Date: 03/02/2017 CLINICAL DATA:  Code stroke. RIGHT-sided weakness beginning  today. Recent subdural hematoma. EXAM: CT HEAD WITHOUT CONTRAST TECHNIQUE: Contiguous axial images were obtained from the base of the skull through the vertex without intravenous contrast. COMPARISON:  CT HEAD March 01, 2017 FINDINGS: BRAIN: No acute large vascular territory infarct. Similar LEFT frontoparietal heterogeneously hypodense subdural hematoma measuring to 12 mm with apparent membrane formation. Similar mass effect, 3 mm LEFT to RIGHT midline shift. Mild mass effect on LEFT lateral ventricle without entrapment. No intraparenchymal hemorrhage. Basal cisterns remain patent. VASCULAR: Unremarkable. SKULL: No skull fracture. Status post LEFT frontal craniotomy. Mild residual LEFT frontal scalp soft tissue swelling without subcutaneous gas or radiopaque foreign bodies. SINUSES/ORBITS: Moderate lobulated paranasal sinusitis, status post FESS. Mastoid air cells are well aerated. Included ocular globes and orbital contents are non-suspicious. OTHER: None. ASPECTS [facility] Stroke Program Early CT Score) - Ganglionic level infarction (caudate, lentiform nuclei, internal capsule, insula, M1-M3 cortex): 7 - Supraganglionic infarction (M4-M6 cortex): 3 Total score (0-10 with 10 being normal): 10 IMPRESSION: 1. No acute intracranial process. 2. Stable mixed density LEFT frontoparietal subdural hematoma with 3 mm LEFT-to-RIGHT midline shift. 3. ASPECTS is 10. Acute findings text paged to Dr.Lindzen, Neurology via AMION secure system on 03/02/2017 at 2:55 pm. Electronically Signed   By: Elon Alas M.D.   On: 03/02/2017 14:59    Impression/Plan   66 y.o. male with left frontoparietal SDH with 16m midline shift. This CT head is stable when compared to yesterday. Case reviewed with attending Dr NKathyrn Sheriff His symptoms have completely resolved at this point. There is no need for acute NS intervention. It is very possible he had a small seizure due to the the SDH. Neuro has evaluated patient and he will under go  EEG and has CTA pending. Will increase his Keppra dose to 10043mBID. Rec admission for work up and monitoring of symptoms. Neuro exam q 1 hour. Report any changes.

## 2017-03-02 NOTE — ED Notes (Signed)
EEG in progress 

## 2017-03-02 NOTE — Progress Notes (Signed)
EEG completed; results pending.    

## 2017-03-02 NOTE — H&P (Signed)
67 year old male Symptomatic bradycardia status post in June pacemaker 11/30/2015 secondary to syncope, prior lumbar radiculopathy secondary to disc herniationstatus post surgery 2005, h;d, recent subdural hemorrhage12/17 2 cm left frontoparietal acute/chronic SDH requiring surgical decompression and patient was discharge 02/02/2017   Came to emergency room [place] around 1 PM and felt extremely weak on the right side had also dysnomia and difficulty with words-but was trying to count from 1-10 in the car on the way home as he drove himself home ultimately elected to call his wife who called EMS and brought him to the hospital  He states also that he has been feeling somewhat "woozy" for the past 1-2 days and had stopped taking Tylenol but then took it and felt better--this is a recent prompted him to see his neurosurgeon yesterday He has been taking his Keppra as scheduled from surgery and may be has missed only 1 or 2 doses but none recently He is not starting any new medications  Emergency room consulted neurosurgery and neurology  Workup emergency room revealed BUN/pending 21/1.0-the same BUN/creatinine is 14/1.0 EKG shows paced rhythm and no further interpretation performed, CT angiogram of the headdid not show large vessel occlusion and showed mixed density left subdural hematoma that was stable since head CT done at neurosurgery office  Review of systems negative as above in addition no chest pain no nausea no vomiting no shortness of breath some blurred vision on the right side and weakness on the right side and upper extremity which is now resolved completely and some aphasia No headache currently but has had some recently No dark stool no tarry stool no seizure-like activity no chest pain no shortness of breath no rash no burning in the urine   Family history-father and mother both lived into the 13s and 110s Dycem natural causes Socially a drinker,  never smoker, lives with wife at home No known drug allergies other than stated above aspirin morphine  BP (!) 130/91   Pulse 63   Temp 98.2 F (36.8 C)   Resp 14   Ht 6' (1.829 m)   Wt 115.4 kg (254 lb 6.6 oz)   SpO2 97%   BMI 34.50 kg/m  Alert pleasant oriented smile symmetric Mallampati 2 power 5/5 bilaterally external ocular movements intact vision by direct confrontation in all quadrants of both eyes is normal no pallor no icterus no thyromegaly no JVD S1-S2 regular rate rhythm pacemaker left chest chest clinically clear Abdomen soft nontender Power 5/5 reflexes 2/3 finger-nose-finger intact did not test gait sensory grossly intact Mood euthymic   ?  Seizure versus TIA-CTA does not reveal any large vessel occlusions defer to neurology further workup suspect that this might been more likely related to his recent subdural hematoma-would continue Keppra at higher dose of 1000 twice daily and await EEG--because of resolution and improvement of the findings at this juncture does not probably need any type of steroids for mass-effect or edema I feel it is reasonable to allow diet as he has had a resolution of his deficits--reasonable to follow urinary drug screen and urine analysis although do not feel this will lead [place] to cause  Mild acute kidney injury-start saline 75 cc/h for 18 hours and reassess in a.m.  Bradycardia status post pacemaker placement 02/2014 keep on telemetry, unclear if bradyarrhythmia would have caused the focal findings that he had so will not at this stage interrogate PPM  Presumed full code Status overnight on telemetry  No family present

## 2017-03-02 NOTE — ED Triage Notes (Addendum)
Per EMS, pt from home, pt began having inability to move right arm and unable to speak at 1340. When ems arrived pt still had some expressive aphasia but it was improving. Pt noted to have facial droop on right side with some drooling and still having some minor dysarthria. Pt [id number]. vss

## 2017-03-02 NOTE — ED Notes (Signed)
Patient ambulatory to restroom with steady gait.

## 2017-03-02 NOTE — ED Notes (Signed)
ED Provider at bedside. 

## 2017-03-02 NOTE — Procedures (Signed)
EEG Report  Clinical History:  Right upper extremity weakness, aphasia lasting for short period of time.  Recent left SDH s/p evacuation.    Technical Summary:  A 19 channel digital EEG recording was performed using the 10-20 international system of electrode placement.  Bipolar and Referential montages were used.  The total recording time was approx 20 minutes.  Findings:  There is a posterior dominant rhythm of 8 Hz reactive to eye opening and closure.  There is intermittent left frontotemporal theta frequency slowing.  On one occasion, there is an epileptiform discharge with maximum negativity at T3.  No electrographic seizures.  He gets drowsy during the recording, but sleep was not recorded.     Impression:  This is an abnormal EEG.  There is evidence of cerebral dysfunction in the left frontal and temporal lobes as well as evidence of a seizure tendency emanating from the left mid-temporal lobe.    Rogue Jury, MS, MD

## 2017-03-03 ENCOUNTER — Inpatient Hospital Stay (HOSPITAL_COMMUNITY): Payer: Medicare HMO

## 2017-03-03 LAB — COMPREHENSIVE METABOLIC PANEL
ALK PHOS: 46 U/L (ref 38–126)
ALT: 14 U/L — AB (ref 17–63)
AST: 15 U/L (ref 15–41)
Albumin: 3.4 g/dL — ABNORMAL LOW (ref 3.5–5.0)
Anion gap: 10 (ref 5–15)
BUN: 15 mg/dL (ref 6–20)
CALCIUM: 9.3 mg/dL (ref 8.9–10.3)
CO2: 23 mmol/L (ref 22–32)
CREATININE: 0.99 mg/dL (ref 0.61–1.24)
Chloride: 107 mmol/L (ref 101–111)
GFR calc Af Amer: >60 mL/min (ref >60)
Glucose, Bld: 100 mg/dL — ABNORMAL HIGH (ref 65–99)
Potassium: 4.2 mmol/L (ref 3.5–5.1)
SODIUM: 140 mmol/L (ref 135–145)
Total Bilirubin: 0.6 mg/dL (ref 0.3–1.2)
Total Protein: 6.8 g/dL (ref 6.5–8.1)

## 2017-03-03 LAB — CBC
HCT: 41.2 % (ref 39.0–52.0)
Hemoglobin: 13.6 g/dL (ref 13.0–17.0)
MCH: 31.3 pg (ref 26.0–34.0)
MCHC: 33 g/dL (ref 30.0–36.0)
MCV: 94.7 fL (ref 78.0–100.0)
PLATELETS: 170 10*3/uL (ref 150–400)
RBC: 4.35 MIL/uL (ref 4.22–5.81)
RDW: 12.5 % (ref 11.5–15.5)
WBC: 6.8 10*3/uL (ref 4.0–10.5)

## 2017-03-03 MED ORDER — HYDROCODONE-ACETAMINOPHEN 5-325 MG PO TABS
1.0000 | ORAL_TABLET | Freq: Four times a day (QID) | ORAL | Status: DC | PRN
  Filled 2017-03-03: qty 1

## 2017-03-03 MED ORDER — ACETAMINOPHEN 325 MG PO TABS
650.0000 mg | ORAL_TABLET | Freq: Four times a day (QID) | ORAL | Status: DC | PRN
  Administered 2017-03-03 – 2017-03-11 (×8): 650 mg via ORAL
  Filled 2017-03-03 (×8): qty 2

## 2017-03-03 NOTE — Progress Notes (Signed)
No issues overnight, but patient reports an episode of speech difficulty and right weakness this am. Resolved spontaneously.  EXAM:  BP (!) 132/93 (BP Location: Right Arm)   Pulse 68   Temp 98.1 F (36.7 C) (Oral)   Resp 19   Ht 6' (1.829 m)   Wt 115.4 kg (254 lb 6.6 oz)   SpO2 97%   BMI 34.50 kg/m   Awake, alert, oriented  Speech fluent, appropriate  CN grossly intact  5/5 BUE/BLE   IMPRESSION:  67 y.o. male s/p right SDH evac, likely with postop SZ. Doubt intermittent neurologic symptoms are related to compression from chronic SDH.  PLAN: - Will check results of long-term EEG. Could consider repeat carniotomy for evacuation of SDH for better SZ control. - Cont  keppra 1000mg  BID

## 2017-03-03 NOTE — Progress Notes (Signed)
vLTM EEG running. Educated pt regarding push button. Notified neuro

## 2017-03-03 NOTE — Progress Notes (Signed)
Subjective: Had recurrent episode of expressive dysphasia, dysequilibrium and right arm weakness this AM while walking.   Objective: Current vital signs: BP 121/76 (BP Location: Right Arm)   Pulse 65   Temp 98.4 F (36.9 C) (Oral)   Resp 18   Ht 6' (1.829 m)   Wt 115.4 kg (254 lb 6.6 oz)   SpO2 95%   BMI 34.50 kg/m  Vital signs in last 24 hours: Temp:  [98.2 F (36.8 C)-98.7 F (37.1 C)] 98.4 F (36.9 C) (01/18 0500) Pulse Rate:  [62-98] 65 (01/18 0500) Resp:  [10-20] 18 (01/18 0500) BP: (118-137)/(76-102) 121/76 (01/18 0500) SpO2:  [95 %-100 %] 95 % (01/18 0500) Weight:  [115.4 kg (254 lb 6.6 oz)] 115.4 kg (254 lb 6.6 oz) (01/17 1442)  Intake/Output from previous day: 01/17 0701 - 01/18 0700 In: 1035 [P.O.:360; I.V.:675] Out: -  Intake/Output this shift: No intake/output data recorded. Nutritional status: Diet regular Room service appropriate? Yes; Fluid consistency: Thin  General Examination:                                                                                                      HEENT- [place]/AT  Lungs- Respirations unlabored Ext: Warm and well-perfused  Neurological Examination Mental Status: Alert, fully oriented. Mild dysarthria. Subtle expressive dysphasia is intermittent but overall similar to yesterday's exam. There is normal fluency alternating with occasional short periods of hesitant speech and occasional word finding difficulty. No errors of grammar or syntax. Comprehension intact. Good insight. Subtle tendency to attend more to the left than the right.  Cranial Nerves: III,IV, VI: EOMI. No nystagmus. No ptosis.  V,VII: Subtle right facial droop.   VIII: hearing intact to conversation IX,X: No hoarseness.   XI: No asymmetry.  Motor: Right :  Upper extremity   5/5                                      Left:     Upper extremity   5/5             Lower extremity   5/5                                                  Lower extremity   5/5 Gait:  Stands on own power. Narrow based stance. Mild unsteadiness.   Lab Results: Results for orders placed or performed during the hospital encounter of 03/02/17 (from the past 48 hour(s))  I-stat troponin, ED     Status: None   Collection Time: 03/02/17  2:43 PM  Result Value Ref Range   Troponin i, poc 0.00 0.00 - 0.08 ng/mL   Comment 3            Comment: Due to the release kinetics of cTnI, a negative result within the first hours of the onset of symptoms  does not rule out myocardial infarction with certainty. If myocardial infarction is still suspected, repeat the test at appropriate intervals.   Ethanol     Status: None   Collection Time: 03/02/17  2:44 PM  Result Value Ref Range   Alcohol, Ethyl (B) <10 <10 mg/dL    Comment:        LOWEST DETECTABLE LIMIT FOR SERUM ALCOHOL IS 10 mg/dL FOR MEDICAL PURPOSES ONLY   Protime-INR     Status: None   Collection Time: 03/02/17  2:44 PM  Result Value Ref Range   Prothrombin Time 13.6 11.4 - 15.2 seconds   INR 1.05   APTT     Status: None   Collection Time: 03/02/17  2:44 PM  Result Value Ref Range   aPTT 31 24 - 36 seconds  CBC     Status: None   Collection Time: 03/02/17  2:44 PM  Result Value Ref Range   WBC 7.8 4.0 - 10.5 K/uL   RBC 4.52 4.22 - 5.81 MIL/uL   Hemoglobin 14.2 13.0 - 17.0 g/dL   HCT 42.5 39.0 - 52.0 %   MCV 94.0 78.0 - 100.0 fL   MCH 31.4 26.0 - 34.0 pg   MCHC 33.4 30.0 - 36.0 g/dL   RDW 12.3 11.5 - 15.5 %   Platelets 174 150 - 400 K/uL  Differential     Status: None   Collection Time: 03/02/17  2:44 PM  Result Value Ref Range   Neutrophils Relative % 66 %   Neutro Abs 5.2 1.7 - 7.7 K/uL   Lymphocytes Relative 24 %   Lymphs Abs 1.8 0.7 - 4.0 K/uL   Monocytes Relative 8 %   Monocytes Absolute 0.6 0.1 - 1.0 K/uL   Eosinophils Relative 2 %   Eosinophils Absolute 0.1 0.0 - 0.7 K/uL   Basophils Relative 0 %   Basophils Absolute 0.0 0.0 - 0.1 K/uL  Comprehensive metabolic panel     Status: Abnormal    Collection Time: 03/02/17  2:44 PM  Result Value Ref Range   Sodium 137 135 - 145 mmol/L   Potassium 4.2 3.5 - 5.1 mmol/L   Chloride 104 101 - 111 mmol/L   CO2 22 22 - 32 mmol/L   Glucose, Bld 98 65 - 99 mg/dL   BUN 18 6 - 20 mg/dL   Creatinine, Ser 1.10 0.61 - 1.24 mg/dL   Calcium 9.4 8.9 - 10.3 mg/dL   Total Protein 7.3 6.5 - 8.1 g/dL   Albumin 3.8 3.5 - 5.0 g/dL   AST 16 15 - 41 U/L   ALT 15 (L) 17 - 63 U/L   Alkaline Phosphatase 50 38 - 126 U/L   Total Bilirubin 0.8 0.3 - 1.2 mg/dL   GFR calc non Af Amer >60 >60 mL/min   GFR calc Af Amer >60 >60 mL/min    Comment: (NOTE) The eGFR has been calculated using the CKD EPI equation. This calculation has not been validated in all clinical situations. eGFR's persistently <60 mL/min signify possible Chronic Kidney Disease.    Anion gap 11 5 - 15  I-Stat Chem 8, ED     Status: Abnormal   Collection Time: 03/02/17  2:45 PM  Result Value Ref Range   Sodium 139 135 - 145 mmol/L   Potassium 4.2 3.5 - 5.1 mmol/L   Chloride 103 101 - 111 mmol/L   BUN 21 (H) 6 - 20 mg/dL   Creatinine, Ser 1.00 0.61 - 1.24 mg/dL  Glucose, Bld 98 65 - 99 mg/dL   Calcium, Ion 1.12 (L) 1.15 - 1.40 mmol/L   TCO2 24 22 - 32 mmol/L   Hemoglobin 14.3 13.0 - 17.0 g/dL   HCT 42.0 39.0 - 52.0 %  Urine rapid drug screen (hosp performed)     Status: None   Collection Time: 03/02/17  5:22 PM  Result Value Ref Range   Opiates NONE DETECTED NONE DETECTED   Cocaine NONE DETECTED NONE DETECTED   Benzodiazepines NONE DETECTED NONE DETECTED   Amphetamines NONE DETECTED NONE DETECTED   Tetrahydrocannabinol NONE DETECTED NONE DETECTED   Barbiturates NONE DETECTED NONE DETECTED    Comment: (NOTE) DRUG SCREEN FOR MEDICAL PURPOSES ONLY.  IF CONFIRMATION IS NEEDED FOR ANY PURPOSE, NOTIFY LAB WITHIN 5 DAYS. LOWEST DETECTABLE LIMITS FOR URINE DRUG SCREEN Drug Class                     Cutoff (ng/mL) Amphetamine and metabolites    1000 Barbiturate and metabolites     200 Benzodiazepine                 779 Tricyclics and metabolites     300 Opiates and metabolites        300 Cocaine and metabolites        300 THC                            50   Urinalysis, Routine w reflex microscopic     Status: Abnormal   Collection Time: 03/02/17  5:22 PM  Result Value Ref Range   Color, Urine YELLOW YELLOW   APPearance CLEAR CLEAR   Specific Gravity, Urine 1.033 (H) 1.005 - 1.030   pH 5.0 5.0 - 8.0   Glucose, UA NEGATIVE NEGATIVE mg/dL   Hgb urine dipstick NEGATIVE NEGATIVE   Bilirubin Urine NEGATIVE NEGATIVE   Ketones, ur NEGATIVE NEGATIVE mg/dL   Protein, ur NEGATIVE NEGATIVE mg/dL   Nitrite NEGATIVE NEGATIVE   Leukocytes, UA NEGATIVE NEGATIVE    No results found for this or any previous visit (from the past 240 hour(s)).  Lipid Panel No results for input(s): CHOL, TRIG, HDL, CHOLHDL, VLDL, LDLCALC in the last 72 hours.  Studies/Results: Ct Angio Head W Or Wo Contrast  Result Date: 03/02/2017 CLINICAL DATA:  67 y/o male code stroke. Right side weakness. Left subdural hematoma status post surgical evacuation in December EXAM: CT ANGIOGRAPHY HEAD AND NECK TECHNIQUE: Multidetector CT imaging of the head and neck was performed using the standard protocol during bolus administration of intravenous contrast. Multiplanar CT image reconstructions and MIPs were obtained to evaluate the vascular anatomy. Carotid stenosis measurements (when applicable) are obtained utilizing NASCET criteria, using the distal internal carotid diameter as the denominator. CONTRAST:  43m ISOVUE-370 IOPAMIDOL (ISOVUE-370) INJECTION 76% COMPARISON:  Head CT without contrast 1447 hr today, and earlier FINDINGS: CTA NECK Skeleton: Sequelae of left frontal craniotomy. No acute osseous abnormality identified. Upper chest: Partially visible left chest cardiac pacemaker type device. Negative lung parenchyma. No superior mediastinal lymphadenopathy. Other neck: Negative.  No cervical  lymphadenopathy. Aortic arch: 3 vessel arch configuration, no arch atherosclerosis. Right carotid system: Negative. Left carotid system: Negative. Vertebral arteries: No proximal right subclavian artery stenosis or plaque. Normal right vertebral artery origin. No cervical right vertebral artery stenosis. Minimal soft plaque in the proximal left subclavian artery without stenosis. Mild calcified plaque at the left vertebral artery origin without stenosis (  series 8 image 122). Mildly non dominant left vertebral artery. Mild V1 segment tortuosity. No stenosis to the skull base. CTA HEAD Posterior circulation: Codominant distal vertebral arteries. Normal PICA origins. Tortuous V4 segments without stenosis. Patent vertebrobasilar junction. Patent basilar artery without stenosis. Normal SCA and PCA origins. Posterior communicating arteries are diminutive or absent. Bilateral PCA branches are somewhat diminutive but otherwise normal. No stenosis identified. Anterior circulation: Patent ICA siphons. No siphon plaque or stenosis. Patent carotid termini. Normal MCA and ACA origins. Anterior communicating artery and bilateral ACA branches are within normal limits. Left MCA M1 segment, bifurcation, and left MCA branches are within normal limits. Right MCA M1 segment, bifurcation, and right MCA branches are within normal limits. Venous sinuses: Not well evaluated due to early contrast timing. The superior sagittal sinus, dominant right transverse and sigmoid sinuses appear patent. Anatomic variants: None. Other findings: Mixed density left subdural hematoma redemonstrated with no contrast extravasation or CTA spot sign identified. Review of the MIP images confirms the above findings IMPRESSION: 1. Negative for emergent large vessel occlusion 2. Minimal atherosclerosis in the head and neck. No arterial stenosis. 3. Mixed density left subdural hematoma appears stable since the head CT 1447 hr today. Electronically Signed   By: Genevie Ann M.D.   On: 03/02/2017 16:45   Ct Head Wo Contrast  Result Date: 03/01/2017 CLINICAL DATA:  Subdural hemorrhage followup EXAM: CT HEAD WITHOUT CONTRAST TECHNIQUE: Contiguous axial images were obtained from the base of the skull through the vertex without intravenous contrast. COMPARISON:  CT 01/31/2017 FINDINGS: Brain: Left convexity subdural fluid collection mildly increased in size, now measuring 17 mm in thickness in the parietal region compared with 11 mm previously. Left frontal fluid collection similar in size measuring 11 mm. Decreased high density blood within the collection. Interval development of multiple septations in the fluid. Mass-effect on the left parietal lobe with 3 mm midline shift similar to the prior study. Negative for hydrocephalus. No acute infarct or mass. Vascular: Negative for hyperdense vessel Skull: Left frontal craniotomy. Sinuses/Orbits: Prior sinus surgery. Mucosal edema paranasal sinuses. Normal orbit. Other: None IMPRESSION: Left hemispheric subdural hematoma over the convexity, slightly larger in the parietal region. Multiple septations. No new or acute hemorrhage. 3 mm midline shift to the right similar to the prior study. Electronically Signed   By: Franchot Gallo M.D.   On: 03/01/2017 10:51   Ct Angio Neck W Or Wo Contrast  Result Date: 03/02/2017 CLINICAL DATA:  67 y/o male code stroke. Right side weakness. Left subdural hematoma status post surgical evacuation in December EXAM: CT ANGIOGRAPHY HEAD AND NECK TECHNIQUE: Multidetector CT imaging of the head and neck was performed using the standard protocol during bolus administration of intravenous contrast. Multiplanar CT image reconstructions and MIPs were obtained to evaluate the vascular anatomy. Carotid stenosis measurements (when applicable) are obtained utilizing NASCET criteria, using the distal internal carotid diameter as the denominator. CONTRAST:  61m ISOVUE-370 IOPAMIDOL (ISOVUE-370) INJECTION 76%  COMPARISON:  Head CT without contrast 1447 hr today, and earlier FINDINGS: CTA NECK Skeleton: Sequelae of left frontal craniotomy. No acute osseous abnormality identified. Upper chest: Partially visible left chest cardiac pacemaker type device. Negative lung parenchyma. No superior mediastinal lymphadenopathy. Other neck: Negative.  No cervical lymphadenopathy. Aortic arch: 3 vessel arch configuration, no arch atherosclerosis. Right carotid system: Negative. Left carotid system: Negative. Vertebral arteries: No proximal right subclavian artery stenosis or plaque. Normal right vertebral artery origin. No cervical right vertebral artery stenosis. Minimal soft plaque  in the proximal left subclavian artery without stenosis. Mild calcified plaque at the left vertebral artery origin without stenosis (series 8 image 122). Mildly non dominant left vertebral artery. Mild V1 segment tortuosity. No stenosis to the skull base. CTA HEAD Posterior circulation: Codominant distal vertebral arteries. Normal PICA origins. Tortuous V4 segments without stenosis. Patent vertebrobasilar junction. Patent basilar artery without stenosis. Normal SCA and PCA origins. Posterior communicating arteries are diminutive or absent. Bilateral PCA branches are somewhat diminutive but otherwise normal. No stenosis identified. Anterior circulation: Patent ICA siphons. No siphon plaque or stenosis. Patent carotid termini. Normal MCA and ACA origins. Anterior communicating artery and bilateral ACA branches are within normal limits. Left MCA M1 segment, bifurcation, and left MCA branches are within normal limits. Right MCA M1 segment, bifurcation, and right MCA branches are within normal limits. Venous sinuses: Not well evaluated due to early contrast timing. The superior sagittal sinus, dominant right transverse and sigmoid sinuses appear patent. Anatomic variants: None. Other findings: Mixed density left subdural hematoma redemonstrated with no contrast  extravasation or CTA spot sign identified. Review of the MIP images confirms the above findings IMPRESSION: 1. Negative for emergent large vessel occlusion 2. Minimal atherosclerosis in the head and neck. No arterial stenosis. 3. Mixed density left subdural hematoma appears stable since the head CT 1447 hr today. Electronically Signed   By: Genevie Ann M.D.   On: 03/02/2017 16:45   Ct Head Code Stroke Wo Contrast  Result Date: 03/02/2017 CLINICAL DATA:  Code stroke. RIGHT-sided weakness beginning today. Recent subdural hematoma. EXAM: CT HEAD WITHOUT CONTRAST TECHNIQUE: Contiguous axial images were obtained from the base of the skull through the vertex without intravenous contrast. COMPARISON:  CT HEAD March 01, 2017 FINDINGS: BRAIN: No acute large vascular territory infarct. Similar LEFT frontoparietal heterogeneously hypodense subdural hematoma measuring to 12 mm with apparent membrane formation. Similar mass effect, 3 mm LEFT to RIGHT midline shift. Mild mass effect on LEFT lateral ventricle without entrapment. No intraparenchymal hemorrhage. Basal cisterns remain patent. VASCULAR: Unremarkable. SKULL: No skull fracture. Status post LEFT frontal craniotomy. Mild residual LEFT frontal scalp soft tissue swelling without subcutaneous gas or radiopaque foreign bodies. SINUSES/ORBITS: Moderate lobulated paranasal sinusitis, status post FESS. Mastoid air cells are well aerated. Included ocular globes and orbital contents are non-suspicious. OTHER: None. ASPECTS [facility] Stroke Program Early CT Score) - Ganglionic level infarction (caudate, lentiform nuclei, internal capsule, insula, M1-M3 cortex): 7 - Supraganglionic infarction (M4-M6 cortex): 3 Total score (0-10 with 10 being normal): 10 IMPRESSION: 1. No acute intracranial process. 2. Stable mixed density LEFT frontoparietal subdural hematoma with 3 mm LEFT-to-RIGHT midline shift. 3. ASPECTS is 10. Acute findings text paged to Dr.Sekou Zuckerman, Neurology via [employer] secure  system on 03/02/2017 at 2:55 pm. Electronically Signed   By: Elon Alas M.D.   On: 03/02/2017 14:59    Medications:  Scheduled: . levETIRAcetam  1,000 mg Oral BID   Continuous: . sodium chloride 75 mL/hr at 03/02/17 1905   [id number]  CTA head and neck official report documents no emergent large vessel occlusion. Minimal atherosclerosis in the head and neck is noted, with no arterial stenosis.  EEG 1/17: Findings:  There is a posterior dominant rhythm of 8 Hz reactive to eye opening and closure.  There is intermittent left frontotemporal theta frequency slowing.  On one occasion, there is an epileptiform discharge with maximum negativity at T3.  No electrographic seizures.  He gets drowsy during the recording, but sleep was not recorded.  Impression:  This  is an abnormal EEG.  There is evidence of cerebral dysfunction in the left frontal and temporal lobes as well as evidence of a seizure tendency emanating from the left mid-temporal lobe.    Assessment: 67 year old male presenting with dysphasia, right facial droop and RUE weakness 1. Symptoms transiently recurring after admission. Witnessed spell today lasting about 30 seconds, without jerking or twitching, but with expressive dysphasia and possible confusion. Also had a spell while ambulating today, prior to Neurology follow up evaluation. Residual mild dysarthria and expressive dysphasia noted on exam. Symptoms most likely secondary to mass effect from the large left subdural hematoma, versus intermittent seizure activity. 2. CTA head and neck were negative for LVO or hemodynamically significant stenosis.  3. Spot EEG shows evidence of cerebral dysfunction in the left frontal and temporal lobes as well as evidence of a seizure tendency emanating from the left mid-temporal lobe.  4. The patient has a pacemaker. Unable to perform MRI.   Recommendations: 1. Continue with increased Keppra dosage at 1000 mg BID 2. Neurosurgery  team is following.  3. LTM EEG with push-button to assess for possible electrographic correlates to the patient's spells of aphasia and dysequilibrium.    LOS: 0 days   '@Electronically'  signed: Dr. Kerney Elbe 03/03/2017  7:49 AM

## 2017-03-03 NOTE — Progress Notes (Signed)
Hospitalist progress note   Anthony Banks  MPN:[id number] DOB: 1950/07/03 DOA: 03/02/2017 PCP: Lavone Orn, MD   Specialists:   Brief Narrative:  67 year old male Symptomatic bradycardia status post in June pacemaker 11/30/2015 secondary to syncope, prior lumbar radiculopathy secondary to disc herniationstatus post surgery 2005, h;d, recent subdural hemorrhage12/17 2 cm left frontoparietal acute/chronic SDH requiring surgical decompression and patient was discharge 02/02/2017  Episodic right-sided weakness dysnomia and somewhat altered with impaired mentation came to emergency room felt to have seizures EEG confirmed the same neurology, neurosurgery has seen  Assessment & Plan:   Assessment:  The encounter diagnosis was Stroke-like symptoms.  Seizures secondary to SDH-recurrent dysnomia and odd sensation 1/18--continue Keppra 1000 twice daily, long-term EEG scheduled-monitor for further symptoms, may need more than 1 AED defer to neurosurgery and neurology Acute kidney injury BUN/creatinine 21/1.0 down to 15/0.99, discontinue IV fluids [name] s/p PPM 02/2014-tele beningn-ambulate and if neg over 24 hr d/c tele   DVT prophylaxis: scd   Code Status:   full   Family Communication:    bnone  Disposition Plan:  inpatient   Consultants:   Ns  neuro  Procedures:   Ct head  Antimicrobials:   none   Subjective:  awake alert dysnomia and weird feeling this am-lasted 5 min Now better  Objective: Vitals:   03/03/17 0100 03/03/17 0300 03/03/17 0500 03/03/17 1013  BP: 119/82 120/80 121/76 131/86  Pulse: 82 68 65 64  Resp: 16 18 18 19   Temp: 98.7 F (37.1 C) 98.7 F (37.1 C) 98.4 F (36.9 C) 97.8 F (36.6 C)  TempSrc: Oral Oral Oral Oral  SpO2: 97% 97% 95% 99%  Weight:      Height:        Intake/Output Summary (Last 24 hours) at 03/03/2017 1051 Last data filed at 03/03/2017 0422 Gross per 24 hour  Intake 1035 ml  Output -  Net 1035 ml   Filed Weights   03/02/17 1442   Weight: 115.4 kg (254 lb 6.6 oz)    Examination:  eomi smile symm, no ict EOMI, vision wnl cta b abd soft nt nd  Power 5/5, sensory intact Reflexes deferred Data Reviewed: I have personally reviewed following labs and imaging studies  CBC: Recent Labs  Lab 03/02/17 1444 03/02/17 1445 03/03/17 0816  WBC 7.8  --  6.8  NEUTROABS 5.2  --   --   HGB 14.2 14.3 13.6  HCT 42.5 42.0 41.2  MCV 94.0  --  94.7  PLT 174  --  008   Basic Metabolic Panel: Recent Labs  Lab 03/02/17 1444 03/02/17 1445 03/03/17 0816  NA 137 139 140  K 4.2 4.2 4.2  CL 104 103 107  CO2 22  --  23  GLUCOSE 98 98 100*  BUN 18 21* 15  CREATININE 1.10 1.00 0.99  CALCIUM 9.4  --  9.3   GFR: Estimated Creatinine Clearance: 93.6 mL/min (by C-G formula based on SCr of 0.99 mg/dL). Liver Function Tests: Recent Labs  Lab 03/02/17 1444 03/03/17 0816  AST 16 15  ALT 15* 14*  ALKPHOS 50 46  BILITOT 0.8 0.6  PROT 7.3 6.8  ALBUMIN 3.8 3.4*   No results for input(s): LIPASE, AMYLASE in the last 168 hours. No results for input(s): AMMONIA in the last 168 hours. Coagulation Profile: Recent Labs  Lab 03/02/17 1444  INR 1.05   Cardiac Enzymes: No results for input(s): CKTOTAL, CKMB, CKMBINDEX, TROPONINI in the last 168 hours. CBG: No results for  input(s): GLUCAP in the last 168 hours. Urine analysis:    Component Value Date/Time   COLORURINE YELLOW 03/02/2017 1722   APPEARANCEUR CLEAR 03/02/2017 1722   LABSPEC 1.033 (H) 03/02/2017 1722   PHURINE 5.0 03/02/2017 1722   GLUCOSEU NEGATIVE 03/02/2017 1722   HGBUR NEGATIVE 03/02/2017 1722   BILIRUBINUR NEGATIVE 03/02/2017 1722   KETONESUR NEGATIVE 03/02/2017 1722   PROTEINUR NEGATIVE 03/02/2017 1722   NITRITE NEGATIVE 03/02/2017 1722   LEUKOCYTESUR NEGATIVE 03/02/2017 1722     Radiology Studies: Reviewed images personally in health database    Scheduled Meds: . levETIRAcetam  1,000 mg Oral BID   Continuous Infusions: . sodium chloride  75 mL/hr at 03/02/17 1905     LOS: 0 days    Time spent: [place], MD [facility] (P) [phone]   If 7PM-7AM, please contact night-coverage [email or web address] Password [facility] 03/03/2017, 10:51 AM

## 2017-03-04 MED ORDER — LORAZEPAM 2 MG/ML IJ SOLN
1.0000 mg | Freq: Once | INTRAMUSCULAR | Status: AC
  Administered 2017-03-04: 1 mg via INTRAVENOUS
  Filled 2017-03-04: qty 1

## 2017-03-04 NOTE — Progress Notes (Signed)
Subjective: Pt seen in bed with family at bedside.  This am  around 6:30am, upon waking up,  pt had an episodes of altered mental status with expressive aphasia, pt states he was " speaking gibberish",  He was able to push the LMT EEG button and call his nurse. He denies associated extremity motor involvement.  Pt states this episode lasted about 20 minutes.  He remained alert with no loss of consciousness or loss of bowel function, and states symptoms spontaneously resolved after receiving  A dose of 1mg  Ativan. Pt states over the last 2 days, he has had occurrence of the "Seizure episodes" once per day.    Objective: Current vital signs: BP 132/80 (BP Location: Right Arm)   Pulse 71   Temp 98.7 F (37.1 C) (Oral)   Resp 20   Ht 6' (1.829 m)   Wt 115.4 kg (254 lb 6.6 oz)   SpO2 98%   BMI 34.50 kg/m  Vital signs in last 24 hours: Temp:  [98 F (36.7 C)-99.1 F (37.3 C)] 98.7 F (37.1 C) (01/19 0919) Pulse Rate:  [63-71] 71 (01/19 0919) Resp:  [16-20] 20 (01/19 0919) BP: (121-145)/(80-93) 132/80 (01/19 0919) SpO2:  [96 %-100 %] 98 % (01/19 0919)  Intake/Output from previous day: No intake/output data recorded. Intake/Output this shift: Total I/O In: 120 [P.O.:120] Out: -  Nutritional status: Diet regular Room service appropriate? Yes; Fluid consistency: Thin  General Examination:                                                                                                      HEENT- Left healing craniotomy scar, PERRLA, normal hearing.  EEG electrodes attached to scalp Lungs- Respirations unlabored Ext: Warm and well-perfused, without BLE edema, moves all extremities equally  Neurological Examination Mental Status: Alert, fully oriented, very pleasant and able to follow all commands.  Dysarthria or expressive aphasia not evident this am Fluent speech, No errors of grammar or syntax. Comprehension intact. Good insight.  Mild forgetfulness noted. Cranial Nerves: III,IV,  VI: EOMI. No nystagmus. No ptosis.  V,VII: Subtle right facial droop - chronic.   VIII: hearing intact to conversation IX,X: Clear speech, No hoarseness.   XI: No asymmetry, tongue midline.  Motor: Right :  Upper extremity   5/5                                      Left:     Upper extremity   5/5             Lower extremity   5/5                                                  Lower extremity   5/5 Gait: Not assessed  Lab Results: Studies/Results:   Medications:  Scheduled: . levETIRAcetam  1,000 mg Oral  BID   Continuous:  KGU:[id number] CT Head 1/16 Left hemispheric subdural hematoma over the convexity, slightly larger in the parietal region. Multiple septations. No new or acute hemorrhage. 3 mm midline shift to the right similar to the prior study.  CTA head and neck official report documents no emergent large vessel occlusion. Minimal atherosclerosis in the head and neck is noted, with no arterial stenosis.  EEG 1/17: Findings:  There is a posterior dominant rhythm of 8 Hz reactive to eye opening and closure.  There is intermittent left frontotemporal theta frequency slowing.  On one occasion, there is an epileptiform discharge with maximum negativity at T3.  No electrographic seizures.  He gets drowsy during the recording, but sleep was not recorded.  Impression:  This is an abnormal EEG.  There is evidence of cerebral dysfunction in the left frontal and temporal lobes as well as evidence of a seizure tendency emanating from the left mid-temporal lobe.    LTM EEG: EEG Description:During wakefulness, there was a bilateral reactive 9 Hz posterior dominant rhythm.  Diffuse beta activity was present, likely due to psychotropic medication effect (e.g., benzodiazepine) or/and lack of mental relaxation.  During drowsiness, slow roving eye movement, vertex waves and attenuation of background were seen.  K complexes and spindles were present in stage II sleep.  Intermittent focal  polymorphic delta slowing was present in the left frontotemporal region.  Intermittent brief runs (lasting typically for a few seconds) of sharp waves were present in the left frontotemporal region (more prominent in the frontal region), which was too rhythmic to be explained solely by breach artifact.  No definite seizure was present.  Event button was pushed at 13:25, 13:39 on 03/03/2017 and 05:29, 06:11 on 03/04/2017, none of which had EEG correlate. Impression: This is an abnormal EEG due to the presence of the following: 1) Intermittent focal polymorphic delta slowing in the left frontotemporal region, suggesting focal cerebral dysfunction; 2) Intermittent focal epileptiform discharges in the left frontotemporal region, suggesting risk of focal epileptogenicity.  No definite seizure was in evidence.  The captured events had no EEG correlate.  However, it should be noted that absence of EEG correlate does not rule out a possible epileptic etiology as simple partial seizures could be surface-negative.   Assessment: 67 year old male presenting with dysphasia, right facial droop and RUE weakness 1. Symptoms transiently recurring after admission - averaging once a day.  PT had witnessed episode this am upon waking up that lasted about 20 minutes. Symptoms involved altered mental status, expressive aphasia, without motor involvement - no jerking or extremity weakness.  Residual expressive aphasia or dysphagia had resolved prior to yesterday's and today's exams, patient currently on Keppra 1000mg   PO BID. Due to how transient the symptoms are, likely transient ischemia or seizure spells from mass effect from large left subdural hematoma vs intermittent seizure activity. Has had other witnessed spells during this admission including one lasting about 30 seconds, without jerking or twitching, but with expressive dysphasia and possible confusion. Another spell while ambulating. Residual mild dysarthria and  expressive dysphasia was noted on exam yesterday, but none today. Symptoms most likely secondary to mass effect from the large left subdural hematoma, as LTM EEG 1/18-1/19 showed no electrographic correlate to the pushbutton events.  2. CTA head and neck were negative for LVO or hemodynamically significant stenosis.  3. CT head: Left hemispheric subdural hematoma with 3 mm midline shift to the right similar to the prior study.  No new or  acute hemorrhage 4. Spot EEG and LTM EEG showed evidence of cerebral dysfunction in the left frontal and temporal lobes as well as evidence of a seizure tendency emanating from the left mid-temporal lobe. Continue Keppra 1000 mg BID. 5. The patient has a pacemaker. Unable to perform MRI.   Recommendations: 1. Continue with increased Keppra dosage at 1000 mg BID 2. Neurosurgery team is following - consider Neurosurgery intervention for transient spells of weakness, most likely due to slight changes in cerebral perfusion in the compressed left hemisphere secondary to the left subdural hematoma.  3. Will discontinue LTM EEG.    LOS: 1 day   Jacob Moores, NP 03/04/2017  10:52 AM

## 2017-03-04 NOTE — Progress Notes (Signed)
Patient ID: Anthony Banks, male   DOB: April 21, 1950, 67 y.o.   MRN: [id number] Vital signs stable  ?seizure this morning Currently feels well On seizure monitoring Continue medical rx for now/

## 2017-03-04 NOTE — Significant Event (Signed)
Rapid Response Event Note  Overview: Seizure activity  Initial Focused Assessment: Per RN, patient is experiencing expressive aphasia and dysarthria.  Patient has had this happen before.  Patient is alert, able to follow commands, no motor or sensory loss. VSS. Remained alert and no issues with airway compromise.   Interventions: -- RN received orders for Ativan 1mg  IV -- Aphasia and dysarthria improved after Ativan -- Patient is already on EEG and is on [facility] (if not transferred): -- Will follow as needed --  Event Summary:  End Time 0700  Anthony Banks R

## 2017-03-04 NOTE — Progress Notes (Signed)
Hospitalist progress note   Anthony Banks  SAY:[id number] DOB: Feb 08, 1951 DOA: 03/02/2017 PCP: Lavone Orn, MD   Specialists:   Brief Narrative:  67 year old male Symptomatic bradycardia status post st jude ppm 11/30/2015- syncope, prior lumbar radiculopathy secondary to disc herniation status post surgery 2005, hld, recent subdural hemorrhage12/17 2 cm left frontoparietal acute/chronic SDH requiring surgical decompression and patient was discharge 02/02/2017  Episodic right-sided weakness dysnomia and somewhat altered with impaired mentation came to emergency room felt to have seizures EEG confirmed the same neurology, neurosurgery has seen  Assessment & Plan:   Assessment:  The encounter diagnosis was Stroke-like symptoms.  Seizures secondary to SDH-recurrent dysnomia and odd sensation 1/18--continue Keppra 1000 twice daily, had event of dysnomia 1/18 and again 1/19 now on long-term EEG scheduled-consideration being given to repeat surgery on head as well as decisions regarding further AED-deferred to both NS and Neurology-updated family and aware will not have definitive answer until LT EEG done Acute kidney injury BUN/creatinine 21/1.0 down to 15/0.99, discontinue IV fluids Anthony Banks s/p PPM 02/2014-tele benign--monitor for now   DVT prophylaxis: scd   Code Status:   full   Family Communication:    bnone  Disposition Plan:  inpatient   Consultants:   Ns  neuro  Procedures:   Ct head  Antimicrobials:   none   Subjective:  Recurrent episode this am dysnomia and diffculty getign words out-nofocal RUE weakness however like on presentation Now back to nl Had this episode for about 20 min and was recorded on eeg  Objective: Vitals:   03/04/17 0223 03/04/17 0609 03/04/17 0648 03/04/17 0919  BP: 133/89 121/87 132/86 132/80  Pulse: 63 67 64 71  Resp: 16 18 18 20   Temp: 98.2 F (36.8 C) 98.5 F (36.9 C) 99.1 F (37.3 C) 98.7 F (37.1 C)  TempSrc: Oral Oral Oral Oral   SpO2: 98% 98% 97% 98%  Weight:      Height:        Intake/Output Summary (Last 24 hours) at 03/04/2017 1157 Last data filed at 03/04/2017 0919 Gross per 24 hour  Intake 120 ml  Output -  Net 120 ml   Filed Weights   03/02/17 1442  Weight: 115.4 kg (254 lb 6.6 oz)    Examination:  eomi smile symm, no ict-power grossly nlslightly slow speech EOMI, vision wnl cta b abd soft nt nd  Power 5/5, sensory intact Reflexes deferred   Data Reviewed: I have personally reviewed following labs and imaging studies  CBC: Recent Labs  Lab 03/02/17 1444 03/02/17 1445 03/03/17 0816  WBC 7.8  --  6.8  NEUTROABS 5.2  --   --   HGB 14.2 14.3 13.6  HCT 42.5 42.0 41.2  MCV 94.0  --  94.7  PLT 174  --  235   Basic Metabolic Panel: Recent Labs  Lab 03/02/17 1444 03/02/17 1445 03/03/17 0816  NA 137 139 140  K 4.2 4.2 4.2  CL 104 103 107  CO2 22  --  23  GLUCOSE 98 98 100*  BUN 18 21* 15  CREATININE 1.10 1.00 0.99  CALCIUM 9.4  --  9.3   GFR: Estimated Creatinine Clearance: 93.6 mL/min (by C-G formula based on SCr of 0.99 mg/dL). Liver Function Tests: Recent Labs  Lab 03/02/17 1444 03/03/17 0816  AST 16 15  ALT 15* 14*  ALKPHOS 50 46  BILITOT 0.8 0.6  PROT 7.3 6.8  ALBUMIN 3.8 3.4*   No results for input(s): LIPASE, AMYLASE in  the last 168 hours. No results for input(s): AMMONIA in the last 168 hours. Coagulation Profile: Recent Labs  Lab 03/02/17 1444  INR 1.05   Cardiac Enzymes: No results for input(s): CKTOTAL, CKMB, CKMBINDEX, TROPONINI in the last 168 hours. CBG: No results for input(s): GLUCAP in the last 168 hours. Urine analysis:    Component Value Date/Time   COLORURINE YELLOW 03/02/2017 1722   APPEARANCEUR CLEAR 03/02/2017 1722   LABSPEC 1.033 (H) 03/02/2017 1722   PHURINE 5.0 03/02/2017 1722   GLUCOSEU NEGATIVE 03/02/2017 1722   HGBUR NEGATIVE 03/02/2017 1722   BILIRUBINUR NEGATIVE 03/02/2017 1722   KETONESUR NEGATIVE 03/02/2017 1722    PROTEINUR NEGATIVE 03/02/2017 1722   NITRITE NEGATIVE 03/02/2017 1722   LEUKOCYTESUR NEGATIVE 03/02/2017 1722     Radiology Studies: Reviewed images personally in health database    Scheduled Meds: . levETIRAcetam  1,000 mg Oral BID   Continuous Infusions:    LOS: 1 day    Time spent: [place], MD [facility] (P) [phone]   If 7PM-7AM, please contact night-coverage [email or web address] Password [facility] 03/04/2017, 11:57 AM

## 2017-03-04 NOTE — Progress Notes (Signed)
LTM discontinued; no skin breakdown was seen. Dr Edison Simon notified.

## 2017-03-04 NOTE — Progress Notes (Addendum)
Patient having difficult talking is dysarthric with expressive aphasia no motor issues. Is alert and oriented X4 called Rapid and [facility] NP gave 1 mg ativan. Vital signs are stable, will continue to monitor.

## 2017-03-04 NOTE — Procedures (Signed)
Continuous Video-EEG Monitoring Report     Patient:                       Anthony Banks, Anthony Banks. MRN:                           [id number] DOB:                           1950/06/04   Study Duration:         03/03/2017 11:33 to 03/04/2017 07:30 CPT Code:                  67124 Diagnosis:                  Spells (R40.4)   History: This is a 67 year old male with a recent history of left-sided subdural hematoma s/p left craniotomy now presenting with recurrent spells of right-sided weakness and speech deficit.  Continuous video-EEG monitoring was performed to evaluate for seizures.     Technical Details:  Long-term video-EEG monitoring was performed using standard setting per the guidelines.  Briefly, a minimum of 21 electrodes were placed on scalp according to the International 10-20 or/and 10-10 Systems.  Supplemental electrodes were placed as needed.  Single EKG electrode was also used to detect cardiac arrhythmia.  Patient's behavior was continuously recorded on video simultaneously with EEG.  A minimum of 16 channels were used for data display.  Each epoch of study was reviewed manually daily and as needed using standard referential and bipolar montages.     EEG Description:  During wakefulness, there was a bilateral reactive 9 Hz posterior dominant rhythm.  Diffuse beta activity was present, likely due to psychotropic medication effect (e.g., benzodiazepine) or/and lack of mental relaxation.  During drowsiness, slow roving eye movement, vertex waves and attenuation of background were seen.  K complexes and spindles were present in stage II sleep.  Intermittent focal polymorphic delta slowing was present in the left frontotemporal region.  Intermittent brief runs (lasting typically for a few seconds) of sharp waves were present in the left frontotemporal region (more prominent in the frontal region), which was too rhythmic to be explained solely by breach artifact.  No definite seizure was present.  Event  button was pushed at 13:25, 13:39 on 03/03/2017 and 05:29, 06:11 on 03/04/2017, none of which had EEG correlate.  Impression:  This is an abnormal EEG due to the presence of the following: 1) Intermittent focal polymorphic delta slowing in the left frontotemporal region, suggesting focal cerebral dysfunction; 2) Intermittent focal epileptiform discharges in the left frontotemporal region, suggesting risk of focal epileptogenicity.  No definite seizure was in evidence.  The captured events had no EEG correlate.  However, it should be noted that absence of EEG correlate does not rule out a possible epileptic etiology as simple partial seizures could be surface-negative.       Reading Physician: Winfield Cunas, MD, PhD

## 2017-03-05 MED ORDER — LORAZEPAM 2 MG/ML IJ SOLN
1.0000 mg | INTRAMUSCULAR | Status: DC | PRN
  Administered 2017-03-05 – 2017-03-08 (×3): 1 mg via INTRAVENOUS
  Filled 2017-03-05 (×3): qty 1

## 2017-03-05 NOTE — Progress Notes (Signed)
Subjective: The patient is alert and pleasant.  He is in no apparent distress.  He had another episode in which he woke up and could not talk last night.  Objective: Vital signs in last 24 hours: Temp:  [98.2 F (36.8 C)-99.1 F (37.3 C)] 98.2 F (36.8 C) (01/20 0944) Pulse Rate:  [60-70] 60 (01/20 0944) Resp:  [18-20] 20 (01/20 0944) BP: (110-135)/(72-86) 125/81 (01/20 0944) SpO2:  [95 %-98 %] 98 % (01/20 0944) Estimated body mass index is 34.5 kg/m as calculated from the following:   Height as of this encounter: 6' (1.829 m).   Weight as of this encounter: 115.4 kg (254 lb 6.6 oz).   Intake/Output from previous day: 01/19 0701 - 01/20 0700 In: 240 [P.O.:240] Out: -  Intake/Output this shift: Total I/O In: 120 [P.O.:120] Out: -   Physical exam the patient is alert and oriented.  His speech and strength is normal.  His craniotomy incision has healed well.  Lab Results: Recent Labs    03/02/17 1444 03/02/17 1445 03/03/17 0816  WBC 7.8  --  6.8  HGB 14.2 14.3 13.6  HCT 42.5 42.0 41.2  PLT 174  --  170   BMET Recent Labs    03/02/17 1444 03/02/17 1445 03/03/17 0816  NA 137 139 140  K 4.2 4.2 4.2  CL 104 103 107  CO2 22  --  23  GLUCOSE 98 98 100*  BUN 18 21* 15  CREATININE 1.10 1.00 0.99  CALCIUM 9.4  --  9.3    Studies/Results: No results found.  Assessment/Plan: Left subdural hematoma, probable seizures: Dr. Kathyrn Sheriff will discuss surgery with the patient tomorrow.  LOS: 2 days     [name] 03/05/2017, 10:13 AM     Patient ID: Anthony Banks, male   DOB: Jul 20, 1950, 67 y.o.   MRN: [id number]

## 2017-03-05 NOTE — Progress Notes (Signed)
Progress Note  Neurology team had a long discussion with patient regarding the LTM EEG results: it was an abnormal EEG with intermittent focal polymorphic delta slowing in the leftfrontotemporal region, suggesting focal cerebral dysfunction; He is at risk for seizures, and must continue the prescribed Keppra.  The LTM EEG did not show evidence of electrographicseizure during the captured events - there was no correlation.   The intermittent  incidents of dysarthria,  expressive aphasia and confusion are most likely a result of mass effect from the the subdural hematoma causing perfusion pressure changes in the brain and transient ischemic symptoms.   From a Neuro standpoint, at this time will recommend continuing [place] as pt continues to be at risk for seizures and will defer to Neurosurgery for possible hematoma evacuation.  Today at about 3am, the patient had another episode of dysarthria and expressive aphasia without evidence of motor involvement. He was aware of the symptoms, which are now resolved.   Physical Exam: unchanged from yesterday.   Assessment: 67 year old male presenting with dysphasia, right facial droop and RUE weakness 1. Symptoms transiently recurring after admission - averaging once a day.  PT had witnessed episode this am upon waking up that lasted about 20 minutes. Symptoms involved altered mental status, expressive aphasia, without motor involvement - no jerking or extremity weakness.  Residual expressive aphasia or dysphagia had resolved prior to our examinations. The patient is currently on Keppra 1000mg  PO BID. Symptoms most likely secondary to mass effect from the large left subdural hematoma, as LTM EEG 1/18-1/19 showed no electrographic correlate to the pushbutton events.  2. CTA head and neck were negative for LVO or hemodynamically significant stenosis.  3. CT head: Left hemispheric subdural hematoma with 3 mm midline shift to the right similar to the prior study.   No new or acute hemorrhage 4. Spot EEG and LTM EEG showed evidence of cerebral dysfunction in the left frontal and temporal lobes as well as evidence of a seizure tendency emanating from the left mid-temporal lobe.Continue Keppra 1000 mg BID.  Recommendations: 1. Continue Keppra at 1000 mg BID 2. Neurosurgery team is following - consider Neurosurgery intervention for transient spells of weakness, most likely due to slight changes in cerebral perfusion in the compressed left hemisphere secondary to the left subdural hematoma.  3. Neurology will sign off. Please call the Neurohospitalist team if there are additional questions.   Electronically signed: Dr. Kerney Elbe

## 2017-03-05 NOTE — Progress Notes (Signed)
Patient has had a further 3 min Seizure--will give PRN ativan 1mg   and neurochecks. Defer probable Surgery to Neurosurgery going forward

## 2017-03-05 NOTE — Progress Notes (Signed)
NT came to desk, informed me that she went in to do VS and patient was dysarthric and having expressive aphasia, as the morning before. As I was assessing him, speech was clearing. Neuro muscular intact. Paged MD to inform him of the same. Also mentioned this to [place], [place] Nurse. RR was called to bedside previously. Will  Continue to monitor this patient.

## 2017-03-05 NOTE — Progress Notes (Signed)
Pt had seizure from 1710-1713. NIH and neuro assessment charted. Symptoms have resolved except for slurred speech. Ativan given. Will continue to monitor.

## 2017-03-05 NOTE — Progress Notes (Signed)
Hospitalist progress note   Anthony Banks  [id number]:[id number] DOB: October 10, 1950 DOA: 03/02/2017 PCP: Lavone Orn, MD   Specialists:   Brief Narrative:  67 year old male Symptomatic bradycardia status post st jude ppm 11/30/2015- syncope, prior lumbar radiculopathy secondary to disc herniation status post surgery 2005, hld, recent subdural hemorrhage12/17 2 cm left frontoparietal acute/chronic SDH requiring surgical decompression and patient was discharge 02/02/2017  Episodic right-sided weakness dysnomia and somewhat altered with impaired mentation came to emergency room felt to have seizures EEG confirmed the same neurology, neurosurgery has seen  Assessment & Plan:   Assessment:  The encounter diagnosis was Stroke-like symptoms.  Seizures secondary to SDH-recurrent dysnomia and odd sensation 1/18--continue Keppra 1000 twice daily, had event of dysnomia 1/18 and again 1/19 --pending further input Dr. Lajuana Ripple re: risk/benefit regarding surgical management of SDH and if would improve symptoms Acute kidney injury BUN/creatinine 21/1.0 down to 15/0.99, discontinue IV fluids [name] s/p PPM 02/2014-tele benign--monitor for now   DVT prophylaxis: scd   Code Status:   full   Family Communication:    bnone  Disposition Plan:  inpatient   Consultants:   Ns  neuro  Procedures:   Ct head  Antimicrobials:   none   Subjective:  Varied episodes mostly nightly of dysnomia-speech patient thnks is back to 85% normal no motor anomaly  Objective: Vitals:   03/05/17 0226 03/05/17 0614 03/05/17 0944 03/05/17 1507  BP: 135/85 130/86 125/81 130/86  Pulse: 67 62 60 64  Resp: 18 18 20 20   Temp: 98.2 F (36.8 C) 98.2 F (36.8 C) 98.2 F (36.8 C) 98.4 F (36.9 C)  TempSrc: Oral Oral Oral Oral  SpO2: 96% 96% 98% 99%  Weight:      Height:        Intake/Output Summary (Last 24 hours) at 03/05/2017 1603 Last data filed at 03/05/2017 0945 Gross per 24 hour  Intake 120 ml  Output -  Net 120  ml   Filed Weights   03/02/17 1442  Weight: 115.4 kg (254 lb 6.6 oz)    Examination:  eomi smile symm, no ict-power grossly nl-speech better objectively EOMI, vision wnl cta b abd soft nt nd  Power 5/5, sensory intact, reflexes not tested-ambualted in hall well earleir in the day Reflexes deferred   Data Reviewed: I have personally reviewed following labs and imaging studies  CBC: Recent Labs  Lab 03/02/17 1444 03/02/17 1445 03/03/17 0816  WBC 7.8  --  6.8  NEUTROABS 5.2  --   --   HGB 14.2 14.3 13.6  HCT 42.5 42.0 41.2  MCV 94.0  --  94.7  PLT 174  --  818   Basic Metabolic Panel: Recent Labs  Lab 03/02/17 1444 03/02/17 1445 03/03/17 0816  NA 137 139 140  K 4.2 4.2 4.2  CL 104 103 107  CO2 22  --  23  GLUCOSE 98 98 100*  BUN 18 21* 15  CREATININE 1.10 1.00 0.99  CALCIUM 9.4  --  9.3   GFR: Estimated Creatinine Clearance: 93.6 mL/min (by C-G formula based on SCr of 0.99 mg/dL). Liver Function Tests: Recent Labs  Lab 03/02/17 1444 03/03/17 0816  AST 16 15  ALT 15* 14*  ALKPHOS 50 46  BILITOT 0.8 0.6  PROT 7.3 6.8  ALBUMIN 3.8 3.4*   No results for input(s): LIPASE, AMYLASE in the last 168 hours. No results for input(s): AMMONIA in the last 168 hours. Coagulation Profile: Recent Labs  Lab 03/02/17 1444  INR 1.05  Cardiac Enzymes: No results for input(s): CKTOTAL, CKMB, CKMBINDEX, TROPONINI in the last 168 hours. CBG: No results for input(s): GLUCAP in the last 168 hours. Urine analysis:    Component Value Date/Time   COLORURINE YELLOW 03/02/2017 1722   APPEARANCEUR CLEAR 03/02/2017 1722   LABSPEC 1.033 (H) 03/02/2017 1722   PHURINE 5.0 03/02/2017 1722   GLUCOSEU NEGATIVE 03/02/2017 1722   HGBUR NEGATIVE 03/02/2017 1722   BILIRUBINUR NEGATIVE 03/02/2017 1722   KETONESUR NEGATIVE 03/02/2017 1722   PROTEINUR NEGATIVE 03/02/2017 1722   NITRITE NEGATIVE 03/02/2017 1722   LEUKOCYTESUR NEGATIVE 03/02/2017 1722     Radiology  Studies: Reviewed images personally in health database    Scheduled Meds: . levETIRAcetam  1,000 mg Oral BID   Continuous Infusions:    LOS: 2 days    Time spent: [place], MD [facility] ([facility]) [phone]   If 7PM-7AM, please contact night-coverage [email or web address] Password [facility] 03/05/2017, 4:03 PM

## 2017-03-06 ENCOUNTER — Other Ambulatory Visit: Payer: Self-pay | Admitting: Neurosurgery

## 2017-03-06 ENCOUNTER — Inpatient Hospital Stay (HOSPITAL_COMMUNITY): Payer: Medicare HMO

## 2017-03-06 MED ORDER — CEFAZOLIN SODIUM-DEXTROSE 2-4 GM/100ML-% IV SOLN
2.0000 g | INTRAVENOUS | Status: AC
  Administered 2017-03-07: 2 g via INTRAVENOUS
  Filled 2017-03-06: qty 100

## 2017-03-06 MED ORDER — CHLORHEXIDINE GLUCONATE CLOTH 2 % EX PADS: 6.0000 | MEDICATED_PAD | Freq: Once | CUTANEOUS | Status: DC

## 2017-03-06 MED ORDER — CHLORHEXIDINE GLUCONATE CLOTH 2 % EX PADS
6.0000 | MEDICATED_PAD | Freq: Once | CUTANEOUS | Status: AC
  Administered 2017-03-06: 6 via TOPICAL

## 2017-03-06 NOTE — Progress Notes (Signed)
Hospitalist progress note   Anthony Banks  ASN:[id number] DOB: 1950/07/16 DOA: 03/02/2017 PCP: Lavone Orn, MD   Specialists:   Brief Narrative:  67 year old male Symptomatic bradycardia status post st jude ppm 11/30/2015- syncope, prior lumbar radiculopathy secondary to disc herniation status post surgery 2005, hld, recent subdural hemorrhage 12/17 2 cm left frontoparietal acute/chronic SDH requiring surgical decompression and patient was discharge 02/02/2017  Episodic right-sided weakness dysnomia and somewhat altered with impaired mentation came to emergency room felt to have seizures EEG confirmed the same neurology, neurosurgery has seen  Assessment & Plan:   Assessment:  The encounter diagnosis was Stroke-like symptoms.  Seizures secondary to SDH-recurrent dysnomia and odd sensation 1/18--continue Keppra 1000 twice daily,  --pending further input Dr. Lajuana Ripple re: risk/benefit regarding surgical management of SDH-He is stable bu still is having recurrences of symtpoms Acute kidney injury BUN/creatinine 21/1.0 down to 15/0.99, discontinue IV fluids-rechekc labs in am [name] s/p PPM 02/2014-tele benign--monitor for now   DVT prophylaxis: scd   Code Status:   full   Family Communication:    bnone  Disposition Plan:  inpatient   Consultants:   Ns  neuro  Procedures:   Ct head  Antimicrobials:   none   Subjective:  3 episodes short lived of RUE weakness and mild change in speech-sleeping when I came by-did not disturb  Objective: Vitals:   03/05/17 2205 03/06/17 0137 03/06/17 0500 03/06/17 0915  BP: 127/84 115/70 135/83 107/81  Pulse: 69 66 63 (!) 105  Resp: 16 16 16    Temp: 98.1 F (36.7 C) 98 F (36.7 C) 98 F (36.7 C) 98 F (36.7 C)  TempSrc: Oral Oral Oral Oral  SpO2: 99% 97% 96% 99%  Weight:      Height:        Intake/Output Summary (Last 24 hours) at 03/06/2017 1419 Last data filed at 03/06/2017 1100 Gross per 24 hour  Intake 360 ml  Output -  Net  360 ml   Filed Weights   03/02/17 1442  Weight: 115.4 kg (254 lb 6.6 oz)    Examination:  Asleep did not disturb to examine  Data Reviewed: I have personally reviewed following labs and imaging studies  CBC: Recent Labs  Lab 03/02/17 1444 03/02/17 1445 03/03/17 0816  WBC 7.8  --  6.8  NEUTROABS 5.2  --   --   HGB 14.2 14.3 13.6  HCT 42.5 42.0 41.2  MCV 94.0  --  94.7  PLT 174  --  193   Basic Metabolic Panel: Recent Labs  Lab 03/02/17 1444 03/02/17 1445 03/03/17 0816  NA 137 139 140  K 4.2 4.2 4.2  CL 104 103 107  CO2 22  --  23  GLUCOSE 98 98 100*  BUN 18 21* 15  CREATININE 1.10 1.00 0.99  CALCIUM 9.4  --  9.3   GFR: Estimated Creatinine Clearance: 93.6 mL/min (by C-G formula based on SCr of 0.99 mg/dL). Liver Function Tests: Recent Labs  Lab 03/02/17 1444 03/03/17 0816  AST 16 15  ALT 15* 14*  ALKPHOS 50 46  BILITOT 0.8 0.6  PROT 7.3 6.8  ALBUMIN 3.8 3.4*   No results for input(s): LIPASE, AMYLASE in the last 168 hours. No results for input(s): AMMONIA in the last 168 hours. Coagulation Profile: Recent Labs  Lab 03/02/17 1444  INR 1.05   Cardiac Enzymes: No results for input(s): CKTOTAL, CKMB, CKMBINDEX, TROPONINI in the last 168 hours. CBG: No results for input(s): GLUCAP in the last  168 hours. Urine analysis:    Component Value Date/Time   COLORURINE YELLOW 03/02/2017 1722   APPEARANCEUR CLEAR 03/02/2017 1722   LABSPEC 1.033 (H) 03/02/2017 1722   PHURINE 5.0 03/02/2017 1722   GLUCOSEU NEGATIVE 03/02/2017 1722   HGBUR NEGATIVE 03/02/2017 1722   BILIRUBINUR NEGATIVE 03/02/2017 1722   KETONESUR NEGATIVE 03/02/2017 1722   PROTEINUR NEGATIVE 03/02/2017 1722   NITRITE NEGATIVE 03/02/2017 1722   LEUKOCYTESUR NEGATIVE 03/02/2017 1722     Radiology Studies: Reviewed images personally in health database    Scheduled Meds: . levETIRAcetam  1,000 mg Oral BID   Continuous Infusions:    LOS: 3 days    Time spent: [place], MD [facility] (P) [phone]   If 7PM-7AM, please contact night-coverage [email or web address] Password [facility] 03/06/2017, 2:19 PM

## 2017-03-06 NOTE — Progress Notes (Signed)
No issues overnight. Cont to have episodes of right arm weakness and speech difficulty, however overall he and his wife both report progressive decline in both speech and strength over the last several days.  EXAM:  BP (P) 122/82 (BP Location: Right Arm)   Pulse (P) 65   Temp (P) 98.1 F (36.7 C) (Oral)   Resp (P) 18   Ht 6' (1.829 m)   Wt 115.4 kg (254 lb 6.6 oz)   SpO2 (P) 99%   BMI 34.50 kg/m   Awake, alert, oriented  Speech fluent, appropriate  CN grossly intact  Mild RUE weakness  IMPRESSION:  67 y.o. male with re-accumulation of subacute/chronic left SDH now ~5 wks s/p initial evacuation. Has had progressive decline in speech and right-sided strength.   PLAN: - Will plan on OR tomorrow ~2p for repeat crani for evacuation - NPO p 6a tomorrow - Will get non-contrast CT head tonight  I have reviewed the situation with the patient and his wife. My recommendation for repeat surgery was discussed. They are aware of the risks of surgery and potential outcomes. All questions were answered and they are ready to proceed.

## 2017-03-06 NOTE — Progress Notes (Signed)
Patient has had 3 episodes. Patient feels his Right hand getting weak/numb and has some dysarthria and aphasic; after a couple of minutes the symptoms subside and patient goes back to baseline. MD page notified.

## 2017-03-07 ENCOUNTER — Inpatient Hospital Stay (HOSPITAL_COMMUNITY): Payer: Medicare HMO

## 2017-03-07 ENCOUNTER — Encounter (HOSPITAL_COMMUNITY)
Admission: EM | Disposition: A | Discharge status: Home / Self Care | Payer: Self-pay | Source: Home / Self Care | Attending: Family Medicine

## 2017-03-07 ENCOUNTER — Encounter (HOSPITAL_COMMUNITY): Payer: Self-pay

## 2017-03-07 HISTORY — PX: CRANIOTOMY: SHX93

## 2017-03-07 LAB — SURGICAL PCR SCREEN
MRSA, PCR: NEGATIVE
Staphylococcus aureus: NEGATIVE

## 2017-03-07 LAB — TYPE AND SCREEN
ABO/RH(D): O POS
Antibody Screen: NEGATIVE

## 2017-03-07 SURGERY — CRANIOTOMY HEMATOMA EVACUATION SUBDURAL
Anesthesia: General | Site: Head | Laterality: Left

## 2017-03-07 MED ORDER — ONDANSETRON HCL 4 MG/2ML IJ SOLN
INTRAMUSCULAR | Status: AC
  Filled 2017-03-07: qty 2

## 2017-03-07 MED ORDER — PROPOFOL 10 MG/ML IV BOLUS
INTRAVENOUS | Status: DC | PRN
  Administered 2017-03-07: 20 mg via INTRAVENOUS
  Administered 2017-03-07: 110 mg via INTRAVENOUS

## 2017-03-07 MED ORDER — LABETALOL HCL 5 MG/ML IV SOLN: 10.0000 mg | INTRAVENOUS | Status: DC | PRN

## 2017-03-07 MED ORDER — PROMETHAZINE HCL 25 MG PO TABS: 12.5000 mg | ORAL_TABLET | ORAL | Status: DC | PRN

## 2017-03-07 MED ORDER — CEFAZOLIN SODIUM-DEXTROSE 2-4 GM/100ML-% IV SOLN
2.0000 g | Freq: Three times a day (TID) | INTRAVENOUS | Status: AC
  Administered 2017-03-07 – 2017-03-08 (×2): 2 g via INTRAVENOUS
  Filled 2017-03-07 (×2): qty 100

## 2017-03-07 MED ORDER — LIDOCAINE 2% (20 MG/ML) 5 ML SYRINGE
INTRAMUSCULAR | Status: DC | PRN
  Administered 2017-03-07: 70 mg via INTRAVENOUS

## 2017-03-07 MED ORDER — ROCURONIUM BROMIDE 10 MG/ML (PF) SYRINGE
PREFILLED_SYRINGE | INTRAVENOUS | Status: DC | PRN
  Administered 2017-03-07: 60 mg via INTRAVENOUS
  Administered 2017-03-07: 40 mg via INTRAVENOUS
  Administered 2017-03-07: 20 mg via INTRAVENOUS

## 2017-03-07 MED ORDER — BUPIVACAINE HCL (PF) 0.5 % IJ SOLN
INTRAMUSCULAR | Status: AC
  Filled 2017-03-07: qty 30

## 2017-03-07 MED ORDER — BACITRACIN ZINC 500 UNIT/GM EX OINT
TOPICAL_OINTMENT | CUTANEOUS | Status: DC | PRN
  Administered 2017-03-07: 1 via TOPICAL

## 2017-03-07 MED ORDER — SODIUM CHLORIDE 0.9 % IV SOLN
INTRAVENOUS | Status: DC | PRN
  Administered 2017-03-07: 12:00:00 via INTRAVENOUS

## 2017-03-07 MED ORDER — SODIUM CHLORIDE 0.9 % IR SOLN
Status: DC | PRN
  Administered 2017-03-07: 500 mL

## 2017-03-07 MED ORDER — ONDANSETRON HCL 4 MG/2ML IJ SOLN
INTRAMUSCULAR | Status: DC | PRN
  Administered 2017-03-07: 4 mg via INTRAVENOUS

## 2017-03-07 MED ORDER — ROCURONIUM BROMIDE 10 MG/ML (PF) SYRINGE
PREFILLED_SYRINGE | INTRAVENOUS | Status: AC
  Filled 2017-03-07: qty 5

## 2017-03-07 MED ORDER — FENTANYL CITRATE (PF) 100 MCG/2ML IJ SOLN
INTRAMUSCULAR | Status: DC | PRN
  Administered 2017-03-07: 50 ug via INTRAVENOUS
  Administered 2017-03-07: 100 ug via INTRAVENOUS
  Administered 2017-03-07 (×2): 50 ug via INTRAVENOUS

## 2017-03-07 MED ORDER — PANTOPRAZOLE SODIUM 40 MG IV SOLR
40.0000 mg | Freq: Every day | INTRAVENOUS | Status: DC
  Administered 2017-03-07: 40 mg via INTRAVENOUS
  Filled 2017-03-07: qty 40

## 2017-03-07 MED ORDER — ONDANSETRON HCL 4 MG PO TABS: 4.0000 mg | ORAL_TABLET | ORAL | Status: DC | PRN

## 2017-03-07 MED ORDER — HEMOSTATIC AGENTS (NO CHARGE) OPTIME
TOPICAL | Status: DC | PRN
  Administered 2017-03-07: 1 via TOPICAL

## 2017-03-07 MED ORDER — SODIUM CHLORIDE 0.9 % IV SOLN
INTRAVENOUS | Status: DC | PRN
  Administered 2017-03-07: 11:00:00 via INTRAVENOUS

## 2017-03-07 MED ORDER — ONDANSETRON HCL 4 MG/2ML IJ SOLN: 4.0000 mg | INTRAMUSCULAR | Status: DC | PRN

## 2017-03-07 MED ORDER — GELATIN ABSORBABLE MT POWD
OROMUCOSAL | Status: DC | PRN
  Administered 2017-03-07: 5 mL via TOPICAL

## 2017-03-07 MED ORDER — LACTATED RINGERS IV SOLN
INTRAVENOUS | Status: DC
  Administered 2017-03-07: 11:00:00 via INTRAVENOUS

## 2017-03-07 MED ORDER — BACITRACIN ZINC 500 UNIT/GM EX OINT
TOPICAL_OINTMENT | CUTANEOUS | Status: AC
  Filled 2017-03-07: qty 28.35

## 2017-03-07 MED ORDER — SUGAMMADEX SODIUM 200 MG/2ML IV SOLN
INTRAVENOUS | Status: DC | PRN
  Administered 2017-03-07: 230 mg via INTRAVENOUS

## 2017-03-07 MED ORDER — 0.9 % SODIUM CHLORIDE (POUR BTL) OPTIME
TOPICAL | Status: DC | PRN
  Administered 2017-03-07 (×3): 1000 mL

## 2017-03-07 MED ORDER — FENTANYL CITRATE (PF) 250 MCG/5ML IJ SOLN
INTRAMUSCULAR | Status: AC
  Filled 2017-03-07: qty 5

## 2017-03-07 MED ORDER — THROMBIN (RECOMBINANT) 20000 UNITS EX SOLR
CUTANEOUS | Status: DC | PRN
  Administered 2017-03-07: 20 mL via TOPICAL

## 2017-03-07 MED ORDER — PROPOFOL 10 MG/ML IV BOLUS
INTRAVENOUS | Status: AC
  Filled 2017-03-07: qty 20

## 2017-03-07 MED ORDER — SODIUM CHLORIDE 0.9 % IV SOLN
INTRAVENOUS | Status: DC
  Administered 2017-03-07 – 2017-03-08 (×2): via INTRAVENOUS

## 2017-03-07 MED ORDER — PHENYLEPHRINE 40 MCG/ML (10ML) SYRINGE FOR IV PUSH (FOR BLOOD PRESSURE SUPPORT): PREFILLED_SYRINGE | INTRAVENOUS | Status: DC | PRN

## 2017-03-07 MED ORDER — SUGAMMADEX SODIUM 500 MG/5ML IV SOLN
INTRAVENOUS | Status: AC
  Filled 2017-03-07: qty 5

## 2017-03-07 MED ORDER — THROMBIN 5000 UNITS EX SOLR
CUTANEOUS | Status: AC
  Filled 2017-03-07: qty 5000

## 2017-03-07 MED ORDER — PHENYLEPHRINE HCL 10 MG/ML IJ SOLN
INTRAVENOUS | Status: DC | PRN
  Administered 2017-03-07: 20 ug/min via INTRAVENOUS

## 2017-03-07 MED ORDER — THROMBIN 20000 UNITS EX SOLR
CUTANEOUS | Status: AC
  Filled 2017-03-07: qty 20000

## 2017-03-07 MED ORDER — LIDOCAINE-EPINEPHRINE 1 %-1:100000 IJ SOLN
INTRAMUSCULAR | Status: AC
  Filled 2017-03-07: qty 1

## 2017-03-07 MED ORDER — OXYCODONE-ACETAMINOPHEN 5-325 MG PO TABS
1.0000 | ORAL_TABLET | ORAL | Status: DC | PRN
  Administered 2017-03-07: 1 via ORAL
  Filled 2017-03-07 (×2): qty 1

## 2017-03-07 MED ORDER — LIDOCAINE 2% (20 MG/ML) 5 ML SYRINGE
INTRAMUSCULAR | Status: AC
  Filled 2017-03-07: qty 5

## 2017-03-07 SURGICAL SUPPLY — 76 items
APL SKNCLS STERI-STRIP NONHPOA (GAUZE/BANDAGES/DRESSINGS)
BATTERY IQ STERILE (MISCELLANEOUS) ×1 IMPLANT
BENZOIN TINCTURE PRP APPL 2/3 (GAUZE/BANDAGES/DRESSINGS) IMPLANT
BLADE CLIPPER SURG (BLADE) ×2 IMPLANT
BNDG CMPR 75X41 PLY ABS (GAUZE/BANDAGES/DRESSINGS) ×1
BNDG GAUZE ELAST 4 BULKY (GAUZE/BANDAGES/DRESSINGS) ×2 IMPLANT
BNDG STRETCH 4X75 NS LF (GAUZE/BANDAGES/DRESSINGS) ×1 IMPLANT
BTRY SRG DRVR 1.5 IQ (MISCELLANEOUS) ×1
BUR ACORN 6.0 PRECISION (BURR) ×2 IMPLANT
BUR MATCHSTICK NEURO 3.0 LAGG (BURR) IMPLANT
BUR SPIRAL ROUTER 2.3 (BUR) ×1 IMPLANT
CANISTER SUCT 3000ML PPV (MISCELLANEOUS) ×2 IMPLANT
CARTRIDGE OIL MAESTRO DRILL (MISCELLANEOUS) ×1 IMPLANT
CLIP VESOCCLUDE MED 6/CT (CLIP) IMPLANT
DIFFUSER DRILL AIR PNEUMATIC (MISCELLANEOUS) ×2 IMPLANT
DRAPE NEUROLOGICAL W/INCISE (DRAPES) ×2 IMPLANT
DRAPE SURG 17X23 STRL (DRAPES) IMPLANT
DRAPE WARM FLUID 44X44 (DRAPE) ×2 IMPLANT
DRESSING TELFA 8X10 (GAUZE/BANDAGES/DRESSINGS) ×1 IMPLANT
DURAMATRIX ONLAY 3X3 (Plate) ×1 IMPLANT
DURAPREP 6ML APPLICATOR 50/CS (WOUND CARE) ×2 IMPLANT
ELECT REM PT RETURN 9FT ADLT (ELECTROSURGICAL) ×2
ELECTRODE REM PT RTRN 9FT ADLT (ELECTROSURGICAL) ×1 IMPLANT
EVACUATOR 1/8 PVC DRAIN (DRAIN) IMPLANT
EVACUATOR SILICONE 100CC (DRAIN) IMPLANT
GAUZE SPONGE 4X4 12PLY STRL (GAUZE/BANDAGES/DRESSINGS) ×1 IMPLANT
GAUZE SPONGE 4X4 16PLY XRAY LF (GAUZE/BANDAGES/DRESSINGS) IMPLANT
GLOVE BIO SURGEON STRL SZ7.5 (GLOVE) ×2 IMPLANT
GLOVE BIOGEL PI IND STRL 6.5 (GLOVE) IMPLANT
GLOVE BIOGEL PI IND STRL 7.5 (GLOVE) ×2 IMPLANT
GLOVE BIOGEL PI INDICATOR 6.5 (GLOVE) ×1
GLOVE BIOGEL PI INDICATOR 7.5 (GLOVE) ×2
GLOVE ECLIPSE 7.0 STRL STRAW (GLOVE) ×4 IMPLANT
GLOVE EXAM NITRILE LRG STRL (GLOVE) IMPLANT
GLOVE EXAM NITRILE XL STR (GLOVE) IMPLANT
GLOVE EXAM NITRILE XS STR PU (GLOVE) IMPLANT
GOWN STRL REUS W/ TWL LRG LVL3 (GOWN DISPOSABLE) ×2 IMPLANT
GOWN STRL REUS W/ TWL XL LVL3 (GOWN DISPOSABLE) IMPLANT
GOWN STRL REUS W/TWL 2XL LVL3 (GOWN DISPOSABLE) IMPLANT
GOWN STRL REUS W/TWL LRG LVL3 (GOWN DISPOSABLE) ×8
GOWN STRL REUS W/TWL XL LVL3 (GOWN DISPOSABLE)
HEMOSTAT POWDER KIT SURGIFOAM (HEMOSTASIS) ×2 IMPLANT
HEMOSTAT SURGICEL 2X14 (HEMOSTASIS) ×1 IMPLANT
KIT BASIN OR (CUSTOM PROCEDURE TRAY) ×2 IMPLANT
KIT ROOM TURNOVER OR (KITS) ×2 IMPLANT
NEEDLE HYPO 22GX1.5 SAFETY (NEEDLE) ×2 IMPLANT
NS IRRIG 1000ML POUR BTL (IV SOLUTION) ×2 IMPLANT
OIL CARTRIDGE MAESTRO DRILL (MISCELLANEOUS) ×2
PACK CRANIOTOMY (CUSTOM PROCEDURE TRAY) ×2 IMPLANT
PATTIES SURGICAL .5 X.5 (GAUZE/BANDAGES/DRESSINGS) IMPLANT
PATTIES SURGICAL .5 X3 (DISPOSABLE) IMPLANT
PATTIES SURGICAL 1X1 (DISPOSABLE) IMPLANT
PLATE 1.5  2HOLE LNG NEURO (Plate) ×2 IMPLANT
PLATE 1.5  2HOLE MED NEURO (Plate) ×2 IMPLANT
PLATE 1.5 2HOLE LNG NEURO (Plate) IMPLANT
PLATE 1.5 2HOLE MED NEURO (Plate) IMPLANT
SCREW SELF DRILL HT 1.5/4MM (Screw) ×12 IMPLANT
SPONGE NEURO XRAY DETECT 1X3 (DISPOSABLE) IMPLANT
SPONGE SURGIFOAM ABS GEL 100 (HEMOSTASIS) ×2 IMPLANT
STAPLER VISISTAT 35W (STAPLE) ×3 IMPLANT
STOCKINETTE 6  STRL (DRAPES) ×1
STOCKINETTE 6 STRL (DRAPES) ×1 IMPLANT
SUT ETHILON 3 0 FSL (SUTURE) IMPLANT
SUT ETHILON 3 0 PS 1 (SUTURE) IMPLANT
SUT NURALON 4 0 TR CR/8 (SUTURE) ×5 IMPLANT
SUT STEEL 0 (SUTURE)
SUT STEEL 0 18XMFL TIE 17 (SUTURE) IMPLANT
SUT VIC AB 0 CT1 18XCR BRD8 (SUTURE) ×2 IMPLANT
SUT VIC AB 0 CT1 8-18 (SUTURE) ×4
SUT VIC AB 3-0 SH 8-18 (SUTURE) ×4 IMPLANT
TOWEL GREEN STERILE (TOWEL DISPOSABLE) ×2 IMPLANT
TOWEL GREEN STERILE FF (TOWEL DISPOSABLE) ×2 IMPLANT
TRAY FOLEY W/METER SILVER 16FR (SET/KITS/TRAYS/PACK) ×2 IMPLANT
TUBE CONNECTING 12X1/4 (SUCTIONS) ×2 IMPLANT
UNDERPAD 30X30 (UNDERPADS AND DIAPERS) ×1 IMPLANT
WATER STERILE IRR 1000ML POUR (IV SOLUTION) ×2 IMPLANT

## 2017-03-07 NOTE — Anesthesia Procedure Notes (Signed)
Procedure Name: Intubation Date/Time: 03/07/2017 11:48 AM Performed by: Barrington Ellison, CRNA Pre-anesthesia Checklist: Patient identified, Emergency Drugs available, Suction available and Patient being monitored Patient Re-evaluated:Patient Re-evaluated prior to induction Oxygen Delivery Method: Circle System Utilized Preoxygenation: Pre-oxygenation with 100% oxygen Induction Type: IV induction Ventilation: Mask ventilation without difficulty Laryngoscope Size: Mac and 4 Grade View: Grade I Tube type: Oral Tube size: 7.5 mm Number of attempts: 1 Airway Equipment and Method: Stylet and Oral airway Placement Confirmation: ETT inserted through vocal cords under direct vision,  positive ETCO2 and breath sounds checked- equal and bilateral Secured at: 24 cm Tube secured with: Tape Dental Injury: Teeth and Oropharynx as per pre-operative assessment

## 2017-03-07 NOTE — Progress Notes (Signed)
No issues overnight. No current c/o.  EXAM:  BP 117/82 (BP Location: Left Arm)   Pulse 64   Temp 97.6 F (36.4 C) (Oral)   Resp 20   Ht 6' (1.829 m)   Wt 115.4 kg (254 lb 6.6 oz)   SpO2 100%   BMI 34.50 kg/m   Awake, alert, oriented  Speech fluent, appropriate  CN grossly intact  Min RUE weakness  IMPRESSION:  67 y.o. male with re-accumulation of left SDH  PLAN: - OR this am for SDH evacuation  I have previously reviewed the indications, risks, benefits, and alternatives to repeat surgery with the patient and his wife. All questions were answered and they are ready to proceed.

## 2017-03-07 NOTE — Transfer of Care (Signed)
Immediate Anesthesia Transfer of Care Note  Patient: Anthony Banks  Procedure(s) Performed: CRANIOTOMY HEMATOMA EVACUATION SUBDURAL (Left Head)  Patient Location: PACU  Anesthesia Type:General  Level of Consciousness: awake and patient cooperative  Airway & Oxygen Therapy: Patient Spontanous Breathing  Post-op Assessment: Report given to RN and Patient moving all extremities X 4  Post vital signs: Reviewed and stable  Last Vitals:  Vitals:   03/07/17 0520 03/07/17 0940  BP: 109/78 117/82  Pulse: 62 64  Resp: 16 20  Temp: 36.7 C 36.4 C  SpO2: 98% 100%    Last Pain:  Vitals:   03/07/17 0940  TempSrc: Oral  PainSc:       Patients Stated Pain Goal: 0 (12/15/26 1188)  Complications: No apparent anesthesia complications

## 2017-03-07 NOTE — Progress Notes (Signed)
Hospitalist progress note   Anthony Banks  ZOX:[id number] DOB: 11-24-1950 DOA: 03/02/2017 PCP: Lavone Orn, MD   Specialists:   Brief Narrative:  67 year old male Symptomatic bradycardia status post st jude ppm 11/30/2015- syncope, prior lumbar radiculopathy secondary to disc herniation status post surgery 2005, hld, recent subdural hemorrhage 12/17 2 cm left frontoparietal acute/chronic SDH requiring surgical decompression and patient was discharge 02/02/2017  Episodic right-sided weakness dysnomia and somewhat altered with impaired mentation came to emergency room felt to have seizures EEG confirmed the same neurology, neurosurgery has seen  Assessment & Plan:   Assessment:  The encounter diagnosis was Stroke-like symptoms.  Seizures secondary to SDH-recurrent dysnomia and odd sensation 1/18--continue Keppra 1000 twice daily,  --going for SDH evacuation this am and ICU--we will NOT FOLLOW.  Please consult if any medical needs post-op-thanks Acute kidney injury BUN/creatinine 21/1.0 down to 15/0.99, discontinue IV fluids-rechekc labs in am [name] s/p PPM 02/2014-tele benign--benign and no new findings   DVT prophylaxis: scd   Code Status:   full   Family Communication:    bnone  Disposition Plan:  inpatient   Consultants:   Ns  neuro  Procedures:   Ct head  Antimicrobials:   none   Subjective:  Had an episode this am when I waked into room-some sunj wekaness-to my exam power was fine Was walking earlier in hallway   Objective: Vitals:   03/06/17 2155 03/07/17 0210 03/07/17 0520 03/07/17 0940  BP: 133/89 113/80 109/78 117/82  Pulse: 71 64 62 64  Resp: 18 16 16 20   Temp: 98.1 F (36.7 C) 98.3 F (36.8 C) 98.1 F (36.7 C) 97.6 F (36.4 C)  TempSrc: Oral Oral Oral Oral  SpO2: 99% 96% 98% 100%  Weight:      Height:        Intake/Output Summary (Last 24 hours) at 03/07/2017 1123 Last data filed at 03/06/2017 1200 Gross per 24 hour  Intake 240 ml  Output -   Net 240 ml   Filed Weights   03/02/17 1442  Weight: 115.4 kg (254 lb 6.6 oz)    Examination:  Aawake alert pleasant in nad Speech clear\ Power 5/5 cta b, no added sound abd soft  Data Reviewed: I have personally reviewed following labs and imaging studies  CBC: Recent Labs  Lab 03/02/17 1444 03/02/17 1445 03/03/17 0816  WBC 7.8  --  6.8  NEUTROABS 5.2  --   --   HGB 14.2 14.3 13.6  HCT 42.5 42.0 41.2  MCV 94.0  --  94.7  PLT 174  --  811   Basic Metabolic Panel: Recent Labs  Lab 03/02/17 1444 03/02/17 1445 03/03/17 0816  NA 137 139 140  K 4.2 4.2 4.2  CL 104 103 107  CO2 22  --  23  GLUCOSE 98 98 100*  BUN 18 21* 15  CREATININE 1.10 1.00 0.99  CALCIUM 9.4  --  9.3   GFR: Estimated Creatinine Clearance: 93.6 mL/min (by C-G formula based on SCr of 0.99 mg/dL). Liver Function Tests: Recent Labs  Lab 03/02/17 1444 03/03/17 0816  AST 16 15  ALT 15* 14*  ALKPHOS 50 46  BILITOT 0.8 0.6  PROT 7.3 6.8  ALBUMIN 3.8 3.4*   No results for input(s): LIPASE, AMYLASE in the last 168 hours. No results for input(s): AMMONIA in the last 168 hours. Coagulation Profile: Recent Labs  Lab 03/02/17 1444  INR 1.05   Cardiac Enzymes: No results for input(s): CKTOTAL, CKMB, CKMBINDEX, TROPONINI  in the last 168 hours. CBG: No results for input(s): GLUCAP in the last 168 hours. Urine analysis:    Component Value Date/Time   COLORURINE YELLOW 03/02/2017 [place] 03/02/2017 1722   LABSPEC 1.033 (H) 03/02/2017 1722   PHURINE 5.0 03/02/2017 1722   GLUCOSEU NEGATIVE 03/02/2017 1722   HGBUR NEGATIVE 03/02/2017 1722   BILIRUBINUR NEGATIVE 03/02/2017 1722   KETONESUR NEGATIVE 03/02/2017 1722   PROTEINUR NEGATIVE 03/02/2017 1722   NITRITE NEGATIVE 03/02/2017 1722   LEUKOCYTESUR NEGATIVE 03/02/2017 1722     Radiology Studies: Reviewed images personally in health database    Scheduled Meds: . Chlorhexidine Gluconate Cloth  6 each Topical Once   . [MAR Hold] levETIRAcetam  1,000 mg Oral BID   Continuous Infusions: .  ceFAZolin (ANCEF) IV    . lactated ringers 10 mL/hr at 03/07/17 1055     LOS: 4 days    Time spent: [place], MD [facility] ([facility]   If 7PM-7AM, please contact night-coverage [email or web address] Password [facility] 03/07/2017, 11:23 AM

## 2017-03-07 NOTE — Op Note (Signed)
PREOP DIAGNOSIS: Recurrent left chronic subdural hematoma  POSTOP DIAGNOSIS: Same  PROCEDURE: 1. Left craniotomy for evacuation of subdural hematoma  SURGEON: Dr. Consuella Lose, MD  ASSISTANT: Ferne Reus, PA-C  ANESTHESIA: General Endotracheal  EBL: 100cc  SPECIMENS: None  DRAINS: None  COMPLICATIONS: None immediate  CONDITION: Stable to PACU  HISTORY: Anthony Banks is a 67 y.o. male who initially underwent evacuation of a subdural hematoma about 1 month ago.  He initially made an excellent recovery, but presented to the emergency department with episodes of right sided incoordination and speech difficulty.  He was admitted for observation where he had multiple further episodes.  He did have CT scan demonstrating reaccumulation of a chronic subdural hematoma.  We therefore elected to proceed with a repeat craniotomy for evacuation.  The risks and benefits of the surgery were explained in detail to the patient and his wife.  After all questions were answered informed consent was obtained and witnessed.  PROCEDURE IN DETAIL: The patient was brought to the operating room. After induction of general anesthesia, the patient was positioned on the operative table in the supine position. All pressure points were meticulously padded.  Previous skin incision was then marked out and prepped and draped in the usual sterile fashion.  After timeout was conducted, skin incision was made sharply, and Bovie electrocautery was used to dissect the subcutaneous tissue and the galea was incised. Hemostasis was achieved on the skin edges with Raney clips. A single piece myocutaneous flap was then elevated and retracted anteriorly.  The titanium plates and screws were identified, and the screws securing the craniotomy flap to the skull were removed.  The previous craniotomy flap was then elevated.  Several 4-0 Nurolon stitches were then identified and cut.  Dural leaflets were then elevated and I  immediately encountered thick chronic subdural membrane.  The membrane was opened with a blunt Technical brewer and chronic subdural fluid was identified under some pressure.  At this point, the remaining dural leaflets were incised and removed around the edges of the craniotomy flap.  The subdural membrane was noted to be extremely thick, measuring probably close to 1 cm or perhaps even more in some areas.  The thickest portion of the subdural hematoma/membrane was noted to be posterior to the craniotomy flap.  I therefore elected to extend the craniotomy posteriorly.  The skin incision was teed off posteriorly, and hemostasis secured with Raney clips.  The high-speed drill with craniotome was then used to extend the craniotomy flap posteriorly.  The second piece of bone flap was then elevated.  The dura was incised again, which allowed better access to the more posterior portion of the chronic subdural hematoma.  In combination of suction and blunt dissection, thickened subdural membrane and subdural hematoma was removed from the posterior frontal and parietal convexity.  Under direct vision, I could not identify any further significant amount of subdural hematoma along the frontal or parietal convexity.  The subdural space was then irrigated with a copious amount of normal saline irrigation.  The brain surface was closely inspected and no active bleeding was identified.  Hemostasis on the epidural edges was secured with a combination of morselized Gelfoam and thrombin and bipolar electrocautery.  A piece of dura matrix was then placed over the dural defect.    The bone flap was then plated together, and secured to the skull with plates and screws.  The wound was then closed in multiple layers with a combination of 0 and  3-0 Vicryl stitches.  Skin was closed with surgical skin staples.  Bacitracin dressing ointment and sterile dressing was then applied.  The patient was then extubated, and taken to the  postanesthesia care unit in stable hemodynamic condition.  At the end of the case all sponge, needle, instrument, and cottonoid counts were correct.

## 2017-03-07 NOTE — Anesthesia Procedure Notes (Signed)
Arterial Line Insertion Start/End1/22/2019 11:15 AM, 03/07/2017 11:20 AM Performed by: Barrington Ellison, CRNA, CRNA  Patient location: Pre-op. Preanesthetic checklist: patient identified and IV checked radial was placed Catheter size: 20 G Hand hygiene performed  and maximum sterile barriers used  Allen's test indicative of satisfactory collateral circulation Attempts: 2 Procedure performed without using ultrasound guided technique. Following insertion, dressing applied and Biopatch. Post procedure assessment: normal  Patient tolerated the procedure well with no immediate complications.

## 2017-03-07 NOTE — Anesthesia Preprocedure Evaluation (Addendum)
Anesthesia Evaluation  Patient identified by MRN, date of birth, ID band Patient awake    Reviewed: Allergy & Precautions, NPO status , Patient's Chart, lab work & pertinent test results  History of Anesthesia Complications Negative for: history of anesthetic complications  Airway Mallampati: II  TM Distance: >3 FB Neck ROM: Full    Dental  (+) Teeth Intact, Dental Advisory Given   Pulmonary    breath sounds clear to auscultation       Cardiovascular negative cardio ROS   Rhythm:Regular     Neuro/Psych Seizures -,     GI/Hepatic negative GI ROS, Neg liver ROS,   Endo/Other  negative endocrine ROS  Renal/GU negative Renal ROS     Musculoskeletal  (+) Arthritis ,   Abdominal   Peds  Hematology negative hematology ROS (+)   Anesthesia Other Findings SDH recurrence   Reproductive/Obstetrics                                                              Anesthesia Evaluation  Patient identified by MRN, date of birth, ID band Patient awake    Reviewed: Allergy & Precautions, NPO status , Patient's Chart, lab work & pertinent test results  Airway Mallampati: II  TM Distance: >3 FB Neck ROM: Full    Dental  (+) Dental Advisory Given   Pulmonary neg pulmonary ROS,    breath sounds clear to auscultation       Cardiovascular negative cardio ROS   Rhythm:Regular Rate:Normal     Neuro/Psych Headaches: acute subdural hematoma., Subdural hematoma    GI/Hepatic negative GI ROS, Neg liver ROS,   Endo/Other  negative endocrine ROS  Renal/GU negative Renal ROS     Musculoskeletal  (+) Arthritis ,   Abdominal   Peds  Hematology negative hematology ROS (+)   Anesthesia Other Findings   Reproductive/Obstetrics                            Lab Results  Component Value Date   WBC 6.8 03/03/2017   HGB 13.6 03/03/2017   HCT 41.2 03/03/2017   MCV 94.7 03/03/2017   PLT 170 03/03/2017   Lab Results  Component Value Date   CREATININE 0.99 03/03/2017   BUN 15 03/03/2017   NA 140 03/03/2017   K 4.2 03/03/2017   CL 107 03/03/2017   CO2 23 03/03/2017    Anesthesia Physical Anesthesia Plan  ASA: III  Anesthesia Plan: General   Post-op Pain Management:    Induction: Intravenous  PONV Risk Score and Plan: 2 and Ondansetron, Dexamethasone and Treatment may vary due to age or medical condition  Airway Management Planned: Oral ETT  Additional Equipment: Arterial line  Intra-op Plan:   Post-operative Plan: Extubation in OR  Informed Consent: I have reviewed the patients History and Physical, chart, labs and discussed the procedure including the risks, benefits and alternatives for the proposed anesthesia with the patient or authorized representative who has indicated his/her understanding and acceptance.   Dental advisory given  Plan Discussed with: CRNA  Anesthesia Plan Comments:        Anesthesia Quick Evaluation  Anesthesia Physical Anesthesia Plan  ASA: II  Anesthesia Plan: General   Post-op Pain Management:  Induction: Intravenous  PONV Risk Score and Plan: 2 and Ondansetron  Airway Management Planned: Oral ETT  Additional Equipment: Arterial line  Intra-op Plan:   Post-operative Plan: Extubation in OR  Informed Consent: I have reviewed the patients History and Physical, chart, labs and discussed the procedure including the risks, benefits and alternatives for the proposed anesthesia with the patient or authorized representative who has indicated his/her understanding and acceptance.   Dental advisory given  Plan Discussed with: CRNA and Surgeon  Anesthesia Plan Comments:         Anesthesia Quick Evaluation

## 2017-03-08 ENCOUNTER — Inpatient Hospital Stay (HOSPITAL_COMMUNITY): Payer: Medicare HMO

## 2017-03-08 ENCOUNTER — Encounter (HOSPITAL_COMMUNITY): Payer: Self-pay | Admitting: Neurosurgery

## 2017-03-08 DIAGNOSIS — R569 Unspecified convulsions: Secondary | ICD-10-CM

## 2017-03-08 MED ORDER — PHENYTOIN SODIUM 50 MG/ML IJ SOLN
100.0000 mg | Freq: Three times a day (TID) | INTRAMUSCULAR | Status: DC
  Administered 2017-03-08 – 2017-03-10 (×6): 100 mg via INTRAVENOUS
  Filled 2017-03-08 (×7): qty 2

## 2017-03-08 MED ORDER — LORAZEPAM 2 MG/ML IJ SOLN
2.0000 mg | Freq: Once | INTRAMUSCULAR | Status: AC
  Administered 2017-03-08: 2 mg via INTRAVENOUS

## 2017-03-08 MED ORDER — SODIUM CHLORIDE 0.9 % IV SOLN
1500.0000 mg | Freq: Once | INTRAVENOUS | Status: AC
Start: 2017-03-08 — End: 2017-03-08
  Administered 2017-03-08: 1500 mg via INTRAVENOUS
  Filled 2017-03-08: qty 30

## 2017-03-08 MED ORDER — LORAZEPAM 2 MG/ML IJ SOLN
INTRAMUSCULAR | Status: AC
  Filled 2017-03-08: qty 1

## 2017-03-08 MED ORDER — PANTOPRAZOLE SODIUM 40 MG PO TBEC
40.0000 mg | DELAYED_RELEASE_TABLET | Freq: Every day | ORAL | Status: DC
  Administered 2017-03-08 – 2017-03-10 (×3): 40 mg via ORAL
  Filled 2017-03-08 (×3): qty 1

## 2017-03-08 MED FILL — Thrombin For Soln 20000 Unit: CUTANEOUS | Qty: 1 | Status: AC

## 2017-03-08 MED FILL — Thrombin For Soln 5000 Unit: CUTANEOUS | Qty: 5000 | Status: AC

## 2017-03-08 MED FILL — Thrombin For Soln Kit 20000 Unit: CUTANEOUS | Qty: 1 | Status: AC

## 2017-03-08 NOTE — Procedures (Signed)
ELECTROENCEPHALOGRAM REPORT  Date of Study: 03/08/2017  Patient's Name: Anthony Banks MRN: [id number] Date of Birth: 04-02-1950  Referring Provider: Dr. Amie Portland  Clinical History: This is a 67 year old man with intermittent right-sided weakness and speech problems.  Medications: Dilantin Keppra Labetalol Ativan  Technical Summary: A multichannel digital EEG recording measured by the international 10-20 system with electrodes applied with paste and impedances below 5000 ohms performed in our laboratory with EKG monitoring in an awake and asleep patient.  Hyperventilation and photic stimulation were not performed.  The digital EEG was referentially recorded, reformatted, and digitally filtered in a variety of bipolar and referential montages for optimal display.    Description: The patient is awake and asleep during the recording.  During maximal wakefulness, there is a low voltage 8 Hz posterior dominant rhythm that attenuates with eye opening, better formed over the right occipital region.  There is occasional focal 2-3 Hz delta slowing seen over the left frontocentral region. During drowsiness and sleep, there is an increase in theta slowing of the background.  Vertex waves and symmetric sleep spindles were seen.  Hyperventilation and photic stimulation were not performed.  There were no epileptiform discharges or electrographic seizures seen.    EKG lead was unremarkable.  Impression: This awake and asleep EEG is abnormal due to occasional focal slowing over the left frontocentral region.  Clinical Correlation of the above findings indicates focal cerebral dysfunction over the left frontocentral region suggestive of underlying structural or physiologic abnormality. The absence of epileptiform discharges does not exclude a clinical diagnosis of epilepsy. Clinical correlation is advised.   Ellouise Newer, M.D.

## 2017-03-08 NOTE — Consult Note (Addendum)
Neurology Consultation  Reason for Consult: Intermittent right-sided weakness and speech problems Referring Physician: Dr Kathyrn Sheriff  CC: Right-sided weakness and speech problems  History is obtained from: Chart, patient, patient's family at bedside  HPI: SADLER TESCHNER is a 67 y.o. male with a past medical history of symptomatic bradycardia status post pacemaker placement in October 2017, lumbar radiculopathy, who had a subdural hematoma in December 2017 requiring surgical decompression, who was doing fairly well but returned back to the hospital for worsening symptoms of intermittent right-sided weakness and speech problems.  Repeat imaging revealed reaccumulation of the subdural for which she was taken in for evacuation yesterday. He continues to have intermittent episodes of speech problems-aphasia and dysarthria along with weakness on the right side.  Symptoms lasted a few seconds to minutes.  His speech has been dysarthric for longer after these episodes happen.  He is currently on Keppra 500 twice daily which was recently increased to Keppra 1 g twice daily.  An overnight video EEG was done on 03/04/2017 which showed intermittent focal polymorphic delta slowing in the right frontoparietal regions as well as intermittent focal epileptiform discharges in the left frontotemporal region suggesting focal epileptogenicity without any actual electro graphic seizures seen. Concern remains for these episodes that are currently happening involving the speech and the right arm being seizures and for this a neurological consultation was placed. According to the patient's wife, his first presenting symptoms in December were leg weakness but this time around he has had no leg weakness but more of arm weakness and speech problems as described above. No recent illnesses.  No chest pain palpitations.  No shortness of breath.  No cough.  No abdominal pain nausea vomiting.   ROS: ROS was performed and is negative  except as noted in the HPI.   Past Medical History:  Diagnosis Date  . Borderline hyperlipidemia   . Cancer ([place])    basal face Dr [name]  . Lumbar degenerative disc disease   . Nummular eczema   . Overweight   . Subdural hematoma ([place])   . Syncope     Family History  Problem Relation Age of Onset  . Asthma Mother   . Asthma Sister   . Cancer Sister        pancreatic    Social History:   reports that  has never smoked. he has never used smokeless tobacco. He reports that he drinks about 0.6 - 1.2 oz of alcohol per week. He reports that he does not use drugs.   Medications  Current Facility-Administered Medications:  .  0.9 %  sodium chloride infusion, , Intravenous, Continuous, Consuella Lose, MD, Last Rate: 75 mL/hr at 03/08/17 0600 .  acetaminophen (TYLENOL) tablet 650 mg, 650 mg, Oral, Q6H PRN, Bodenheimer, Charles A, NP, 650 mg at 03/08/17 5597 .  fosPHENYtoin (CEREBYX) 1,500 mg PE in sodium chloride 0.9 % 50 mL IVPB, 1,500 mg PE, Intravenous, Once, Amie Portland, MD .  labetalol (NORMODYNE,TRANDATE) injection 10-40 mg, 10-40 mg, Intravenous, Q10 min PRN, Consuella Lose, MD .  lactated ringers infusion, , Intravenous, Continuous, Duane Boston, MD, Stopped at 03/07/17 1129 .  levETIRAcetam (KEPPRA) tablet 1,000 mg, 1,000 mg, Oral, BID, Costella, Vincent J, PA-C, 1,000 mg at 03/07/17 2143 .  LORazepam (ATIVAN) 2 MG/ML injection, , , ,  .  LORazepam (ATIVAN) injection 1 mg, 1 mg, Intravenous, Q4H PRN, Verlon Au, Jai-Gurmukh, MD, 1 mg at 03/06/17 1144 .  ondansetron (ZOFRAN) tablet 4 mg, 4 mg, Oral, Q4H PRN **  OR** ondansetron (ZOFRAN) injection 4 mg, 4 mg, Intravenous, Q4H PRN, Consuella Lose, MD .  oxyCODONE-acetaminophen (PERCOCET/ROXICET) 5-325 MG per tablet 1 tablet, 1 tablet, Oral, Q4H PRN, Consuella Lose, MD, 1 tablet at 03/07/17 1443 .  pantoprazole (PROTONIX) EC tablet 40 mg, 40 mg, Oral, QHS, Rumbarger, Rachel L, RPH .  phenytoin (DILANTIN) injection  100 mg, 100 mg, Intravenous, Q8H, Amie Portland, MD .  promethazine (PHENERGAN) tablet 12.5-25 mg, 12.5-25 mg, Oral, Q4H PRN, Consuella Lose, MD   Exam: Current vital signs: BP 123/88   Pulse 74   Temp 98.5 F (36.9 C) (Oral)   Resp 16   Ht '6\' 7"'  (2.007 m)   Wt 111.9 kg (246 lb 11.1 oz)   SpO2 94%   BMI 27.79 kg/m  Vital signs in last 24 hours: Temp:  [97.5 F (36.4 C)-98.9 F (37.2 C)] 98.5 F (36.9 C) (01/23 0800) Pulse Rate:  [64-104] 74 (01/23 0700) Resp:  [8-23] 16 (01/23 0700) BP: (103-140)/(65-93) 123/88 (01/23 0700) SpO2:  [91 %-100 %] 94 % (01/23 0700) Arterial Line BP: (107-191)/(67-182) 191/182 (01/23 0700) Weight:  [111.9 kg (246 lb 11.1 oz)] 111.9 kg (246 lb 11.1 oz) (01/22 1415) General: Patient is awake alert in no acute distress HEENT: Head wrapped in a bandage status post subdural hematoma evacuation done yesterday.  Dry oral mucous membranes. Lungs clear to auscultation Cardiovascular: S1-S2 regular rate rhythm Extremities: Warm well perfused with intact pulses Abdomen nondistended nontender Neurological exam Patient is awake, alert, oriented to self.  His speech is slow.  He could not tell me the date or time.  Was able to tell me his name. Speech is mildly dysarthric and he has a lot of perseveration. Naming is also mildly impaired.  Repetition is also mildly impaired. Poor attention concentration CN: PERRLA, EOMI, visual fields full to threat, face symmetric, palate elevates symmetrically, tongue midline. Motor exam: Antigravity in all 4 extremities with 4/5 right upper extremity strength and 4+/5 bilateral lower extremity strength.  Left upper extremity 5/5. Sensory exam: Initially said he could not feel me touching on his legs but when I pinched the skin a little he was able to feel and withdrew his legs with no focal weakness. Coordination: Did not cooperate for the exam Gait cannot be tested  Labs I have reviewed labs in epic and the results  pertinent to this consultation are: No recent CBC or BMP CBC    Component Value Date/Time   WBC 6.8 03/03/2017 0816   RBC 4.35 03/03/2017 0816   HGB 13.6 03/03/2017 0816   HCT 41.2 03/03/2017 0816   PLT 170 03/03/2017 0816   MCV 94.7 03/03/2017 0816   MCH 31.3 03/03/2017 0816   MCHC 33.0 03/03/2017 0816   RDW 12.5 03/03/2017 0816   LYMPHSABS 1.8 03/02/2017 1444   MONOABS 0.6 03/02/2017 1444   EOSABS 0.1 03/02/2017 1444   BASOSABS 0.0 03/02/2017 1444    CMP     Component Value Date/Time   NA 140 03/03/2017 0816   K 4.2 03/03/2017 0816   CL 107 03/03/2017 0816   CO2 23 03/03/2017 0816   GLUCOSE 100 (H) 03/03/2017 0816   BUN 15 03/03/2017 0816   CREATININE 0.99 03/03/2017 0816   CALCIUM 9.3 03/03/2017 0816   PROT 6.8 03/03/2017 0816   ALBUMIN 3.4 (L) 03/03/2017 0816   AST 15 03/03/2017 0816   ALT 14 (L) 03/03/2017 0816   ALKPHOS 46 03/03/2017 0816   BILITOT 0.6 03/03/2017 0816   GFRNONAA >60  03/03/2017 0816   GFRAA >60 03/03/2017 0816    Imaging I have reviewed the images obtained: CT-scan of the brain -decreased left subdural hematoma after evacuation yesterday.  Maximum thickness of the subdural is 1 cm at the parietal convexity.  Assessment:  67 year old man with a past history of symptomatic bradycardia, status post pacemaker placement, lumbar discopathy, subdural hematoma in 2017 requiring surgical decompression and again requiring decompression yesterday presenting with worsening of symptoms that include intermittent right-sided weakness and speech problems. On my exam, he did have subtle right-sided weakness in his arm as well as expressive aphasia and perseveration. I think his current presentation might be a combination of encephalopathy in the setting of the subdural as well as there might be underlying seizures emanating from the left cerebral hemisphere causing these intermittent symptoms.  Another differential is left MCA territory  stroke.  Impression: Evaluate for seizures Evaluate for stroke Subdural hematoma status post evacuation Encephalopathy secondary to subdural hematoma - multifacorial   Recommendations: Agree with increasing Keppra to 1 g twice daily Loaded with fosphenytoin 1500 mg x1 Continue Dilantin 100 mg 3 times daily after that Obtain a routine EEG -will assess the need for long-term EEG based on the results of the routine EEG. MRI brain without contrast- pacemaker information has been  faxed to the MRI scanner suite to ensure compatibility.  If compatible, would love to see an MRI brain for any other structural lesions. Maintain seizure precautions We will continue to follow with you.  Met with the family and answered all their questions. Relayed the plan to the consulting physician.  -- Amie Portland, MD [facility] Pager: [phone] If 7pm to 7am, please call on call as listed on AMION.  CRITICAL CARE ATTESTATION This patient is critically ill and at significant risk of neurological worsening, death and care requires constant monitoring of vital signs, hemodynamics,respiratory and cardiac monitoring. I spent 35  minutes of neurocritical care time performing neurological assessment, discussion with family, other specialists and medical decision making of high complexityin the care of  this patient.

## 2017-03-08 NOTE — Progress Notes (Signed)
No issues overnight, however this am pt had onset of right arm incoordination and speech difficulty.   EXAM:  BP 112/76   Pulse 69   Temp 98.4 F (36.9 C) (Oral)   Resp 14   Ht 6\' 7"  (2.007 m)   Wt 111.9 kg (246 lb 11.1 oz)   SpO2 98%   BMI 27.79 kg/m   Awake, alert, minimally verbal CN grossly intact Dysmetric with RUE, good strength Moves LUE/LLE well Dressing in place, c/d/i  IMAGING: CTH reviewed, improvement in left convexity SDH with minimal mass effect. No HCP  IMPRESSION:  67 y.o. male POD#1 recurrent SDH evac, cont to have episodic speech difficulty and right dysmetria. Seems very c/w SZ especially given episodic nature and the fact that there is now minimal mass effect.   PLAN: - Spoke with neurology, will plan on MRI and then place EEG. Will add second AED - Cont observation

## 2017-03-08 NOTE — Anesthesia Postprocedure Evaluation (Signed)
Anesthesia Post Note  Patient: Anthony Banks  Procedure(s) Performed: CRANIOTOMY HEMATOMA EVACUATION SUBDURAL (Left Head)     Patient location during evaluation: PACU Anesthesia Type: General Level of consciousness: awake and patient cooperative Pain management: pain level controlled Vital Signs Assessment: post-procedure vital signs reviewed and stable Respiratory status: spontaneous breathing, nonlabored ventilation, respiratory function stable and patient connected to nasal cannula oxygen Cardiovascular status: blood pressure returned to baseline and stable Postop Assessment: no apparent nausea or vomiting Anesthetic complications: no    Last Vitals:  Vitals:   03/08/17 1500 03/08/17 1600  BP:    Pulse: 69   Resp:    Temp:  36.8 C  SpO2: 98%     Last Pain:  Vitals:   03/08/17 1600  TempSrc: Oral  PainSc:                  Jomo Forand

## 2017-03-08 NOTE — Progress Notes (Signed)
EEG completed; results pending.    

## 2017-03-09 NOTE — Care Management Note (Signed)
Case Management Note  Patient Details  Name: Anthony Banks MRN: [id number] Date of Birth: Oct 27, 1950  Subjective/Objective:   Pt admitted on 03/02/17 with Rt sided weakness and dynomia.  Pt with recurrent SDH requiring re-evacuation on 03/07/17.  PTA, pt independent, lives at home with spouse.                  Action/Plan: PT recommending OP therapy at dc; OT consult pending.  Wife requesting hospital bed for home, as all bedrooms are upstairs.  Will arrange DME as requested.  Will follow up to make OP referrals prior to dc.    Expected Discharge Date:                  Expected Discharge Plan:  OP Rehab  In-House Referral:     Discharge planning Services  CM Consult  Post Acute Care Choice:  Durable Medical Equipment Choice offered to:  Spouse  DME Arranged:  Hospital bed DME Agency:     HH Arranged:    [place] Agency:     Status of Service:  In process, will continue to follow  If discussed at Long Length of Stay Meetings, dates discussed:    Additional Comments:  Reinaldo Raddle, RN, BSN  Trauma/Neuro ICU Case Manager [phone]

## 2017-03-09 NOTE — Progress Notes (Signed)
Neurology Progress Note   S:// Patient seen and examined. His speech is much improved.   O:// Current vital signs: BP 104/68 (BP Location: Right Arm)   Pulse 74   Temp 98.9 F (37.2 C) (Oral)   Resp 18   Ht 6\' 7"  (2.007 m)   Wt 111.9 kg (246 lb 11.1 oz)   SpO2 98%   BMI 27.79 kg/m  Vital signs in last 24 hours: Temp:  [98.2 F (36.8 C)-98.9 F (37.2 C)] 98.9 F (37.2 C) (01/24 0800) Pulse Rate:  [60-82] 74 (01/24 0800) Resp:  [12-24] 18 (01/24 0800) BP: (104-132)/(68-106) 104/68 (01/24 0800) SpO2:  [94 %-100 %] 98 % (01/24 0800) General: Awake alert oriented x3.  No distress HEENT: Head bandages off.  Wound looks clean and dry. Lungs clear to auscultation Cardia vascular: S1-S2 heard regular rate rhythm Extremities: Warm well perfused with intact pulses Abdomen nondistended and nontender Neurological exam Patient is awake, alert, oriented to time place and person.  His speech is back to baseline per family and on my exam is much improved from what it was yesterday.  He was alert tell me the month, year, place where he is at. No dysarthria noted on speech today. Naming is preserved.  Repetition is preserved.  Attention concentration is still mildly reduced. Cranial nerves: Pupils equal round reactive to light and accommodation, extraocular movements intact, visual fields full, face symmetric, palate elevates symmetrically, shoulder shrug intact and tongue is midline. Motor exam: Nearly symmetric 4+/5 in all extremities at this time. Sensory exam: Intact light touch all over without extinction Coordination: Intact finger-nose-finger on both sides Gait not tested for patient safety at this time.  Medications  Current Facility-Administered Medications:  .  0.9 %  sodium chloride infusion, , Intravenous, Continuous, Consuella Lose, MD, Stopped at 03/08/17 2000 .  acetaminophen (TYLENOL) tablet 650 mg, 650 mg, Oral, Q6H PRN, Bodenheimer, Charles A, NP, 650 mg at  03/08/17 4034 .  labetalol (NORMODYNE,TRANDATE) injection 10-40 mg, 10-40 mg, Intravenous, Q10 min PRN, Consuella Lose, MD .  lactated ringers infusion, , Intravenous, Continuous, Duane Boston, MD, Stopped at 03/07/17 1129 .  levETIRAcetam (KEPPRA) tablet 1,000 mg, 1,000 mg, Oral, BID, Costella, Vincent J, PA-C, 1,000 mg at 03/08/17 2224 .  LORazepam (ATIVAN) injection 1 mg, 1 mg, Intravenous, Q4H PRN, Nita Sells, MD, 1 mg at 03/08/17 1845 .  ondansetron (ZOFRAN) tablet 4 mg, 4 mg, Oral, Q4H PRN **OR** ondansetron (ZOFRAN) injection 4 mg, 4 mg, Intravenous, Q4H PRN, Consuella Lose, MD .  oxyCODONE-acetaminophen (PERCOCET/ROXICET) 5-325 MG per tablet 1 tablet, 1 tablet, Oral, Q4H PRN, Consuella Lose, MD, 1 tablet at 03/07/17 1443 .  pantoprazole (PROTONIX) EC tablet 40 mg, 40 mg, Oral, QHS, Rumbarger, Valeda Malm, RPH, 40 mg at 03/08/17 2225 .  phenytoin (DILANTIN) injection 100 mg, 100 mg, Intravenous, Q8H, Amie Portland, MD, 100 mg at 03/09/17 0421 .  promethazine (PHENERGAN) tablet 12.5-25 mg, 12.5-25 mg, Oral, Q4H PRN, Consuella Lose, MD Labs CBC-unremarkable    Component Value Date/Time   WBC 6.8 03/03/2017 0816   RBC 4.35 03/03/2017 0816   HGB 13.6 03/03/2017 0816   HCT 41.2 03/03/2017 0816   PLT 170 03/03/2017 0816   MCV 94.7 03/03/2017 0816   MCH 31.3 03/03/2017 0816   MCHC 33.0 03/03/2017 0816   RDW 12.5 03/03/2017 0816   LYMPHSABS 1.8 03/02/2017 1444   MONOABS 0.6 03/02/2017 1444   EOSABS 0.1 03/02/2017 1444   BASOSABS 0.0 03/02/2017 1444    CMP -glucose  100, albumin low at 3.4, ALT low at 14, rest of it is normal.    Component Value Date/Time   NA 140 03/03/2017 0816   K 4.2 03/03/2017 0816   CL 107 03/03/2017 0816   CO2 23 03/03/2017 0816   GLUCOSE 100 (H) 03/03/2017 0816   BUN 15 03/03/2017 0816   CREATININE 0.99 03/03/2017 0816   CALCIUM 9.3 03/03/2017 0816   PROT 6.8 03/03/2017 0816   ALBUMIN 3.4 (L) 03/03/2017 0816   AST 15 03/03/2017  0816   ALT 14 (L) 03/03/2017 0816   ALKPHOS 46 03/03/2017 0816   BILITOT 0.6 03/03/2017 0816   GFRNONAA >60 03/03/2017 0816   GFRAA >60 03/03/2017 0816    Imaging I have reviewed images in epic and the results pertinent to this consultation are: MRI examination of the brain shows no acute findings.  Shows postop changes of the left subdural evacuation.  No strokes or new bleeds.  EEG-occasional focal left frontocentral slowing.  Assessment:  67 year old man with a past history of some dramatic bradycardia status post pacemaker placement, lumbar radiculopathy, subdural hematoma 2017 requiring surgical decompression and again requiring decompression 2 days ago, who was seen in neurological consultation for intermittent right-sided weakness and speech problems. He did have subtle right hemiparesis yesterday which seems to have improved.  His speech is also improved today. My suspicion is that he was having intermittent seizures originating from the left cerebral hemisphere and the addition of Dilantin and increasing the dose of Keppra yesterday has helped him. An MRI of the brain was done that does not reveal any strokes or new bleeds and is reassuring.  Impression: Possible seizures in the setting of subdural hematoma  Recommendations: Continue Keppra 1 g twice daily Continue Dilantin 100mg   3 times daily Maintain seizure precautions.  Discussed in detail with the family including [place] state law that prevents driving unless 6 months seizure-free. Follow-up with outpatient neurology.  Patient is a established patient at [facility] and will call [place] neurology to make an appointment to see a neurologist in the upcoming 1-2 weeks. Neurosurgical follow-up per neurosurgery service. Neurology service and patient will be available as needed.  Please call with questions.  -- Amie Portland, MD [facility] Pager: [phone] If 7pm to 7am, please call on call as  listed on AMION.

## 2017-03-09 NOTE — Progress Notes (Addendum)
No issues overnight. No episodes of speech difficulty or incoordination today. Does report his right arm is improved, but not normal.  EXAM:  BP (!) 120/91   Pulse 68   Temp 98.2 F (36.8 C) (Oral)   Resp (!) 21   Ht 6\' 7"  (2.007 m)   Wt 111.9 kg (246 lb 11.1 oz)   SpO2 97%   BMI 27.79 kg/m   Awake, alert, oriented  Speech fluent, appropriate  CN grossly intact  5/5 BUE/BLE   IMAGING: MRI w/o gad reviewed, small residual parietal convexity SDH with minimal mass effect, otherwise unremarkable.  IMPRESSION:  67 y.o. male s/p evac of re-accumulation of left SDH, focal SZ which appear to be controlled with LEV and PHT  PLAN: - Cont obs in ICU with Keppra and dilantin - May transfer tomorrow

## 2017-03-09 NOTE — Evaluation (Signed)
Physical Therapy Evaluation Patient Details Name: Anthony Banks MRN: [id number] DOB: 1950/02/26 Today's Date: 03/09/2017   History of Present Illness  67 year old man with PMHx: bradycardia status post pacemaker placement, lumbar radiculopathy, SDH 2017 requiring decompression and again requiring decompression 1/22. Pt with intermittent right-sided weakness and speech problems considered to be Sz.  Clinical Impression  Pt pleasant and eager to mobilize. Pt with good overall strength and mobility with decreased standing balance, safety awareness and function who will benefit from acute therapy to maximize mobility, independence and gait to decrease burden of care and return pt to PLOF. REcommend daily mobility with nursing staff with RW use.   BP 112/74 supine 123/81 after gait, HR 77    Follow Up Recommendations Outpatient PT    Equipment Recommendations  None recommended by PT    Recommendations for Other Services OT consult     Precautions / Restrictions Precautions Precautions: Fall      Mobility  Bed Mobility Overal bed mobility: Modified Independent                Transfers Overall transfer level: Needs assistance   Transfers: Sit to/from Stand Sit to Stand: Min guard         General transfer comment: guarding for safety with cues for direction and lines  Ambulation/Gait Ambulation/Gait assistance: Min assist Ambulation Distance (Feet): 300 Feet Assistive device: Rolling walker (2 wheeled) Gait Pattern/deviations: Step-through pattern;Decreased stride length;Drifts right/left   Gait velocity interpretation: at or above normal speed for age/gender General Gait Details: pt leans and drifts right while pushing RW off to the left and needs cues to step into RW, stand fully upright and attend to RW and direction. Pt unable to recall room number and needed cues to return to correct room  Stairs            Wheelchair Mobility    Modified Rankin  (Stroke Patients Only) Modified Rankin (Stroke Patients Only) Pre-Morbid Rankin Score: No symptoms Modified Rankin: Moderate disability     Balance Overall balance assessment: Needs assistance   Sitting balance-Leahy Scale: Good       Standing balance-Leahy Scale: Fair                               Pertinent Vitals/Pain Pain Assessment: No/denies pain    Home Living Family/patient expects to be discharged to:: Private residence Living Arrangements: Spouse/significant other Available Help at Discharge: Family;Available 24 hours/day Type of Home: House Home Access: Stairs to enter   [employer] of Steps: 1 Home Layout: Two level;Bed/bath upstairs;Able to live on main level with bedroom/bathroom Home Equipment: Gilford Rile - 2 wheels;Crutches;Tub bench      Prior Function Level of Independence: Independent               Hand Dominance        Extremity/Trunk Assessment   Upper Extremity Assessment Upper Extremity Assessment: Overall WFL for tasks assessed    Lower Extremity Assessment Lower Extremity Assessment: Overall WFL for tasks assessed(right hip flexion 4/5 all others 5/5)    Cervical / Trunk Assessment Cervical / Trunk Assessment: Normal  Communication   Communication: No difficulties  Cognition Arousal/Alertness: Awake/alert Behavior During Therapy: Flat affect Overall Cognitive Status: Impaired/Different from baseline Area of Impairment: Safety/judgement                         Safety/Judgement: Decreased awareness of deficits;Decreased  awareness of safety            General Comments      Exercises     Assessment/Plan    PT Assessment Patient needs continued PT services  PT Problem List Decreased mobility;Decreased safety awareness;Decreased activity tolerance;Decreased balance;Decreased knowledge of use of DME;Decreased cognition       PT Treatment Interventions Gait training;Patient/family  education;Therapeutic exercise;Stair training;Balance training;Functional mobility training;Neuromuscular re-education;DME instruction;Therapeutic activities    PT Goals (Current goals can be found in the Care Plan section)  Acute Rehab PT Goals Patient Stated Goal: return to golf PT Goal Formulation: With patient Time For Goal Achievement: 03/23/17 Potential to Achieve Goals: Good    Frequency Min 3X/week   Barriers to discharge        Co-evaluation               AM-PAC PT "6 Clicks" Daily Activity  Outcome Measure Difficulty turning over in bed (including adjusting bedclothes, sheets and blankets)?: None Difficulty moving from lying on back to sitting on the side of the bed? : A Little Difficulty sitting down on and standing up from a chair with arms (e.g., wheelchair, bedside commode, etc,.)?: A Little Help needed moving to and from a bed to chair (including a wheelchair)?: A Little Help needed walking in hospital room?: A Little Help needed climbing 3-5 steps with a railing? : A Little 6 Click Score: 19    End of Session Equipment Utilized During Treatment: Gait belt Activity Tolerance: Patient tolerated treatment well Patient left: in chair;with call bell/phone within reach;with chair alarm set Nurse Communication: Mobility status;Precautions PT Visit Diagnosis: Other abnormalities of gait and mobility (R26.89);Unsteadiness on feet (R26.81)    Time: 8850-2774 PT Time Calculation (min) (ACUTE ONLY): 26 min   Charges:   PT Evaluation $PT Eval Moderate Complexity: 1 Mod PT Treatments $Gait Training: 8-22 mins   PT G Codes:        Elwyn Reach, PT [phone]   [place] 03/09/2017, 2:31 PM

## 2017-03-10 ENCOUNTER — Encounter: Payer: Self-pay | Admitting: Neurology

## 2017-03-10 MED ORDER — PHENYTOIN 50 MG PO CHEW
100.0000 mg | CHEWABLE_TABLET | Freq: Three times a day (TID) | ORAL | Status: DC
  Administered 2017-03-10 – 2017-03-11 (×3): 100 mg via ORAL
  Filled 2017-03-10 (×5): qty 2

## 2017-03-10 NOTE — Evaluation (Signed)
Occupational Therapy Evaluation and Discharge Patient Details Name: Anthony Banks MRN: [id number] DOB: 05-Jun-1950 Today's Date: 03/10/2017    History of Present Illness 67 year old man with PMHx: bradycardia status post pacemaker placement, lumbar radiculopathy, SDH 2017 requiring decompression and again requiring decompression 1/22. Pt with intermittent right-sided weakness and speech problems considered to be Sz.   Clinical Impression   This 67 yo male admitted with above presents to acute OT with all education completed with pt, wife and son. No further OT needs we will sign off.    Follow Up Recommendations  No OT follow up;Supervision/Assistance - 24 hour    Equipment Recommendations  None recommended by OT       Precautions / Restrictions Precautions Precautions: Fall Restrictions Weight Bearing Restrictions: No      Mobility Bed Mobility Overal bed mobility: Modified Independent                Transfers Overall transfer level: Needs assistance   Transfers: Sit to/from Stand Sit to Stand: Supervision         General transfer comment: Ambulated 100 feet without AD and min A and intermitten shuffling gait    Balance Overall balance assessment: Needs assistance Sitting-balance support: No upper extremity supported;Feet supported Sitting balance-Leahy Scale: Normal       Standing balance-Leahy Scale: Poor Standing balance comment: statically pt fair, dynamically poor (gait belt  used)           ADL either performed or assessed with clinical judgement   ADL                                         General ADL Comments: Overall at a min guard A when up on his feet and intermittent S in sitting for basic ADLs. Talked with patient, wife and son about pt sitting to shower for safety reasons. Also spoke to them about the proprioception I feel pt is having (he describes as clumsy--knocking things over). Pt needs to use his vision to attend  to what he is doing and not try to multi task also he needs to survey his surroundings when walking around so he potientally does not run into things on his right (he did this time 1 when walking back into the room ). I also educated them on him not using his RUE to hold anything wth hot liquids in it nor to use sharp knives. When speaking to nursing she reports that he has done so much better in the last 24 hours that hopefully these issues will resolve as well     Vision Baseline Vision/History: Wears glasses Wears Glasses: Reading only Patient Visual Report: No change from baseline Vision Assessment?: Yes Eye Alignment: Within Functional Limits Ocular Range of Motion: Within Functional Limits Alignment/Gaze Preference: Within Defined Limits Tracking/Visual Pursuits: Able to track stimulus in all quads without difficulty Saccades: Additional eye shifts occurred during testing;Decreased speed of saccadic movement Convergence: Within functional limits Visual Fields: No apparent deficits Additional Comments: Spoke with wife and son to let them know of pt's decreased saccadic movements at present and that we use this visual skill alot with driving and thus pt unsafe to drive at this point            Pertinent Vitals/Pain Pain Assessment: No/denies pain     Hand Dominance Right   Extremity/Trunk Assessment Upper Extremity Assessment Upper Extremity Assessment:  RUE deficits/detail RUE Deficits / Details: pt reports at times his arm is clumsy (knocks things over), when had him do finger to nose with alternating arms and eyes shut he slowy was out of alignment with RUE (started at finger tip to nose and ended up after 4 repetitions of MCP to nose RUE Sensation: decreased proprioception           Communication Communication Communication: No difficulties   Cognition Arousal/Alertness: Awake/alert Behavior During Therapy: Flat affect Overall Cognitive Status: Impaired/Different from  baseline Area of Impairment: Safety/judgement                     Memory: Decreased short-term memory   Safety/Judgement: Decreased awareness of deficits;Decreased awareness of safety     General Comments: pt able to recall room number but in backwards order (20N4), able to find his way back to room, can tell me that his right arm is clumsy sometimes but not automatically using vision to help with this (Extremity/trunk assessment)              Home Living Family/patient expects to be discharged to:: Private residence Living Arrangements: Spouse/significant other Available Help at Discharge: Family;Available 24 hours/day Type of Home: House Home Access: Stairs to enter [employer] of Steps: 1   Home Layout: Two level;Bed/bath upstairs;Able to live on main level with bedroom/bathroom     Bathroom Shower/Tub: Hospital doctor Toilet: Handicapped height     Home Equipment: Environmental consultant - 2 wheels;Crutches;Shower seat - built in          Prior Functioning/Environment Level of Independence: Independent                 OT Problem List: Decreased coordination;Impaired balance (sitting and/or standing)         OT Goals(Current goals can be found in the care plan section) Acute Rehab OT Goals Patient Stated Goal: return to golf  OT Frequency:                AM-PAC PT "6 Clicks" Daily Activity     Outcome Measure Help from another person eating meals?: None Help from another person taking care of personal grooming?: None Help from another person toileting, which includes using toliet, bedpan, or urinal?: A Little Help from another person bathing (including washing, rinsing, drying)?: A Little Help from another person to put on and taking off regular upper body clothing?: A Little Help from another person to put on and taking off regular lower body clothing?: A Little 6 Click Score: 20   End of Session Equipment Utilized During Treatment:  Gait belt  Activity Tolerance: Patient tolerated treatment well Patient left: (sitting EOB eating lunch)  OT Visit Diagnosis: Unsteadiness on feet (R26.81)                Time: 5726-2035 OT Time Calculation (min): 44 min Charges:  OT General Charges $OT Visit: 1 Visit OT Evaluation $OT Eval Moderate Complexity: 1 Mod OT Treatments $Self Care/Home Management : 23-37 mins ,[place], OTR/L [phone] 03/10/2017

## 2017-03-10 NOTE — Progress Notes (Addendum)
Physical Therapy Treatment Patient Details Name: Anthony Banks MRN: [id number] DOB: [birth year] Today's Date: 03/10/2017    History of Present Illness 67 year old man with PMHx: bradycardia status post pacemaker placement, lumbar radiculopathy, SDH 2017 requiring decompression and again requiring decompression 1/22. Pt with intermittent right-sided weakness and speech problems considered to be Sz.    PT Comments    Pt with great progression with improved balance and stability with gait. Pt able to walk with RW without drifting or leaning today and complete stairs with minguard assist. Pt educated for balance deficits and need to sit with bathing, use RW at all times and continue with OPPT to address all balance deficits. Pt aware of need for assist with mobility for safety. Will continue to follow.     Follow Up Recommendations  Outpatient PT     Equipment Recommendations  Rolling walker with 5" wheels    Recommendations for Other Services       Precautions / Restrictions Precautions Precautions: Fall    Mobility  Bed Mobility Overal bed mobility: Modified Independent                Transfers Overall transfer level: Needs assistance   Transfers: Sit to/from Stand Sit to Stand: Supervision         General transfer comment: supervision for lines  Ambulation/Gait Ambulation/Gait assistance: Supervision Ambulation Distance (Feet): 600 Feet Assistive device: Rolling walker (2 wheeled) Gait Pattern/deviations: Step-through pattern;Decreased stride length   Gait velocity interpretation: Below normal speed for age/gender General Gait Details: pt with midline posture, good use of RW and maintaining balance without drifting with RW today. Attempted gait 4' without RW with shuffling steps and unsteady gait with pt aware of continued need for RW   Stairs Stairs: Yes   Stair Management: One rail Right;Alternating pattern;Forwards Number of Stairs: 11 General stair  comments: pt with good stability on stairs with use of rail to ascend/descend  Wheelchair Mobility    Modified Rankin (Stroke Patients Only) Modified Rankin (Stroke Patients Only) Pre-Morbid Rankin Score: No symptoms Modified Rankin: Moderate disability     Balance Overall balance assessment: Needs assistance   Sitting balance-Leahy Scale: Good       Standing balance-Leahy Scale: Fair   Single Leg Stance - Right Leg: 0 Single Leg Stance - Left Leg: 0 Tandem Stance - Right Leg: 2 Tandem Stance - Left Leg: 5 Rhomberg - Eyes Opened: 30 Rh[place]berg - Eyes Closed: 10   High Level Balance Comments: pt with LOB with narrow BOS and unable to perform SLS or fully tandem Rhomberg due to LOB            Cognition Arousal/Alertness: Awake/alert Behavior During Therapy: Flat affect Overall Cognitive Status: Impaired/Different from baseline Area of Impairment: Safety/judgement;Memory                     Memory: Decreased short-term memory   Safety/Judgement: Decreased awareness of deficits;Decreased awareness of safety     General Comments: pt unable to recall room number without cues and needed assistance to attend to room when near it      Exercises      General Comments        Pertinent Vitals/Pain Pain Assessment: No/denies pain    Home Living                      Prior Function            PT Goals (current goals  can now be found in the care plan section) Progress towards PT goals: Progressing toward goals    Frequency           PT Plan Current plan remains appropriate    Co-evaluation              AM-PAC PT "6 Clicks" Daily Activity  Outcome Measure  Difficulty turning over in bed (including adjusting bedclothes, sheets and blankets)?: None Difficulty moving from lying on back to sitting on the side of the bed? : A Little Difficulty sitting down on and standing up from a chair with arms (e.g., wheelchair, bedside commode,  etc,.)?: A Little Help needed moving to and from a bed to chair (including a wheelchair)?: A Little Help needed walking in hospital room?: A Little Help needed climbing 3-5 steps with a railing? : A Little 6 Click Score: 19    End of Session Equipment Utilized During Treatment: Gait belt Activity Tolerance: Patient tolerated treatment well Patient left: in chair;with call bell/phone within Banks;with chair alarm set Nurse Communication: Mobility status;Precautions PT Visit Diagnosis: Other abnormalities of gait and mobility (R26.89);Unsteadiness on feet (R26.81)     Time: 7517-0017 PT Time Calculation (min) (ACUTE ONLY): 21 min  Charges:  $Gait Training: 8-22 mins                    G Codes:       Anthony Banks, Anthony Banks    [place] 03/10/2017, 1:02 PM

## 2017-03-10 NOTE — Progress Notes (Signed)
Pt with subdural hematoma and Rt sided weakness which requires that [facility] be elevated 30 degrees or more at all times.  Pt has no bedroom on bottom floor of home, and will need hospital bed on main floor of home to prevent falls on stairs due to Rt sided weakness.  Anticipate need for hospital bed will be temporary.    Reinaldo Raddle, RN, BSN  Trauma/Neuro ICU Case Manager [phone]

## 2017-03-10 NOTE — Care Management Note (Signed)
Case Management Note  Patient Details  Name: Anthony Banks MRN: [id number] Date of Birth: 08/14/1950  Subjective/Objective:   Pt admitted on 03/02/17 with Rt sided weakness and dynomia.  Pt with recurrent SDH requiring re-evacuation on 03/07/17.  PTA, pt independent, lives at home with spouse.                  Action/Plan: PT recommending OP therapy at dc; OT consult pending.  Wife requesting hospital bed for home, as all bedrooms are upstairs.  Will arrange DME as requested.  Will follow up to make OP referrals prior to dc.    Expected Discharge Date:                  Expected Discharge Plan:  OP Rehab  In-House Referral:     Discharge planning Services  CM Consult  Post Acute Care Choice:  Durable Medical Equipment Choice offered to:  Spouse  DME Arranged:  3-N-1, Rollene Rotunda DME Agency:  [place]:    [place]:     Status of Service:  Completed, signed off  If discussed at [place] of Stay Meetings, dates discussed:    Additional Comments:  03/10/17 J. Jonerik Sliker, RN, BSN Pt made good progress with PT today; able to do stairs without difficulty.  Pt/wife have decided they do not want hospital bed.  Requesting RW and shower seat; agreeable to 3 in 1 BSC to use as shower seat as it is covered by insurance.  Referral to [facility] for DME needs.  Will make referral to [facility] for OP physical therapy, as recommended by PT.    Reinaldo Raddle, RN, BSN  Trauma/Neuro ICU Case Manager [phone]

## 2017-03-10 NOTE — Progress Notes (Signed)
No issues overnight. No speech or weakness episodes overnight. Right arm is subjectively improving.  EXAM:  BP 103/82 (BP Location: Right Arm)   Pulse 68   Temp 98.3 F (36.8 C) (Oral)   Resp 17   Ht 6\' 7"  (2.007 m)   Wt 111.9 kg (246 lb 11.1 oz)   SpO2 94%   BMI 27.79 kg/m   Awake, alert, oriented  Speech fluent, appropriate  CN grossly intact  5/5 BUE/BLE   IMPRESSION:  67 y.o. male POD# 3 subdural evac, SZ appear to be controlled on Keppra/dilantin  PLAN: - Can transfer to floor - If stable and no SZ can likely d/c home tomorrow

## 2017-03-11 MED ORDER — PHENYTOIN 50 MG PO CHEW: 100.0000 mg | CHEWABLE_TABLET | Freq: Three times a day (TID) | ORAL | 2 refills | Status: DC

## 2017-03-11 NOTE — Progress Notes (Signed)
Neurosurgery Progress Note  No issues overnight.  No further speech/weakness episodes Right arm remains subjectively weak  EXAM:  BP 105/71 (BP Location: Right Arm)   Pulse 63   Temp 98.1 F (36.7 C) (Oral)   Resp 18   Ht 6\' 7"  (2.007 m)   Wt 111.9 kg (246 lb 11.1 oz)   SpO2 96%   BMI 27.79 kg/m   Awake, alert, oriented  Speech fluent, appropriate  CN grossly intact  5/5 BUE/BLE   PLAN Stable this am. No further SZ Cleared for d/c F/U 1 week for staple removal

## 2017-03-11 NOTE — Progress Notes (Signed)
Discharge instructions reviewed with patient/family. All questions answered at this time. Equipments at bedside. Pt/family declined hospital bed. Pt declined pain med. No other distress noted. VSS. Transport home by family.   [place], RN

## 2017-03-11 NOTE — Discharge Summary (Signed)
Physician Discharge Summary  Patient ID: Anthony Banks MRN: [id number] DOB/AGE: 69-03-1950 69 y.o.  Admit date: 03/02/2017 Discharge date: 03/11/2017  Admission Diagnoses:  Seizure  Discharge Diagnoses:  Same Active Problems:   Seizure [facility])   Discharged Condition: Stable  Hospital Course:  Anthony Banks is a 67 y.o. male who presented to ER for acute onset slurring speech and right sided weakness. Symptoms resolved <2 hours. History of SDH with craniotomy in December. CT revealed SDH still present but improved when compared to Dec so no emergent NSY intervention underwent.. Symptoms recurred despite increasing keppra dose. Work up revealed symptoms due to SZ from mass effect of SDH. Underwent craniotomy for evacuation on 1/22. With the help of neurology adjusting seizure meds in combo with crani, patient remained SZ free for 48 hours and cleared for discharge with neuro follow up and NSY follow up in 1 week for staple removal. Appreciate TH and neuro assistance.   Treatments: Surgery  Left craniotomy for evacuation of subdural hematoma  Discharge Exam: Blood pressure 105/71, pulse 63, temperature 98.1 F (36.7 C), temperature source Oral, resp. rate 18, height 6\' 7"  (2.007 m), weight 111.9 kg (246 lb 11.1 oz), SpO2 96 %. Awake, alert, oriented Speech fluent, appropriate CN grossly intact 5/5 BUE/BLE Wound c/d/i  Disposition: 01-Home or Self Care  Discharge Instructions    Ambulatory referral to Physical Therapy   Complete by:  As directed    Call MD for:  difficulty breathing, headache or visual disturbances   Complete by:  As directed    Call MD for:  persistant dizziness or light-headedness   Complete by:  As directed    Call MD for:  redness, tenderness, or signs of infection (pain, swelling, redness, odor or green/yellow discharge around incision site)   Complete by:  As directed    Call MD for:  severe uncontrolled pain   Complete by:  As directed    Call MD for:   temperature >100.4   Complete by:  As directed    Diet general   Complete by:  As directed    Driving Restrictions   Complete by:  As directed    Do not drive until given clearance.   Increase activity slowly   Complete by:  As directed    Lifting restrictions   Complete by:  As directed    Do not lift anything >10lbs. Avoid bending and twisting in awkward positions. Avoid bending at the back.   Remove dressing in 24 hours   Complete by:  As directed      Allergies as of 03/11/2017      Reactions   Aspirin Hives, Other (See Comments)   Tight chest   Morphine Sulfate Anaphylaxis, Swelling   Swelling of the throat   Nsaids Hives, Other (See Comments)   Chest Pains      Medication List    TAKE these medications   acetaminophen 325 MG tablet Commonly known as:  TYLENOL Take 325-650 mg by mouth every 6 (six) hours as needed (for pain).   BIOFREEZE EX Apply 1 application topically as needed (Neck Pain).   glucosamine-chondroitin 500-400 MG tablet Take 2 tablets by mouth daily.   levETIRAcetam 500 MG tablet Commonly known as:  KEPPRA Take 1 tablet (500 mg total) by mouth 2 (two) times daily.   phenytoin 50 MG tablet Commonly known as:  DILANTIN Chew 2 tablets (100 mg total) by mouth every 8 (eight) hours.   VITAMIN C PO Take  1 tablet by mouth daily.            Durable Medical Equipment  (From admission, onward)        Start     Ordered   03/10/17 1414  For home use only DME Bedside commode  Once    Question:  Patient needs a bedside commode to treat with the following condition  Answer:  Subdural hematoma ([place])   03/10/17 1414   03/10/17 1413  For home use only DME Walker rolling  Once    Comments:  TALL RW, patient is 6'7  Question:  Patient needs a walker to treat with the following condition  Answer:  Subdural hematoma ([place])   03/10/17 1414     Follow-up Information    Consuella Lose, MD Follow up.   Specialty:  Neurosurgery Contact  information: [address] [place] [address] [place] [address] [phone]           Signed: Traci Sermon 03/11/2017, 9:46 AM

## 2017-03-14 ENCOUNTER — Encounter: Payer: Self-pay | Admitting: Physical Therapy

## 2017-03-14 ENCOUNTER — Ambulatory Visit: Payer: Medicare HMO | Attending: Neurosurgery | Admitting: Physical Therapy

## 2017-03-14 DIAGNOSIS — R29818 Other symptoms and signs involving the nervous system: Secondary | ICD-10-CM | POA: Insufficient documentation

## 2017-03-14 DIAGNOSIS — R2689 Other abnormalities of gait and mobility: Secondary | ICD-10-CM | POA: Diagnosis not present

## 2017-03-14 DIAGNOSIS — R2681 Unsteadiness on feet: Secondary | ICD-10-CM | POA: Insufficient documentation

## 2017-03-14 NOTE — Therapy (Signed)
[facility] [address] [place], [place], [address] Phone: [phone]   Fax:  [phone]  Physical Therapy Evaluation  Patient Details  Name: Anthony Banks MRN: [id number] Date of Birth: 03-12-1950 Referring Provider: Consuella Lose, MD   Encounter Date: 03/14/2017  PT End of Session - 03/14/17 1056    Visit Number  1    Number of Visits  16    Date for PT Re-Evaluation  05/09/17    Authorization Type  [employer] $40 copay    PT Start Time  1015    PT Stop Time  1053    PT Time Calculation (min)  38 min    Equipment Utilized During Treatment  Gait belt    Activity Tolerance  Patient tolerated treatment well    Behavior During Therapy  [facility] for tasks assessed/performed       Past Medical History:  Diagnosis Date  . Borderline hyperlipidemia   . Cancer ([place])    basal face Dr Anthony Banks  . Lumbar degenerative disc disease   . Nummular eczema   . Overweight   . Subdural hematoma ([place])   . Syncope     Past Surgical History:  Procedure Laterality Date  . basal/squamous cell face    . bilateral blepharoplasy    . COLONOSCOPY     polyps remove  . COLONOSCOPY WITH PROPOFOL N/A 11/24/2014   Procedure: COLONOSCOPY WITH PROPOFOL;  Surgeon: Anthony Fair, MD;  Location: [facility];  Service: Endoscopy;  Laterality: N/A;  . CRANIOTOMY N/A 01/30/2017   Procedure: CRANIOTOMY HEMATOMA EVACUATION SUBDURAL;  Surgeon: Anthony Lose, MD;  Location: [facility];  Service: Neurosurgery;  Laterality: N/A;  CRANIOTOMY HEMATOMA EVACUATION SUBDURAL  . CRANIOTOMY Left 03/07/2017   Procedure: CRANIOTOMY HEMATOMA EVACUATION SUBDURAL;  Surgeon: Anthony Lose, MD;  Location: [facility];  Service: Neurosurgery;  Laterality: Left;  . EP IMPLANTABLE DEVICE N/A 11/30/2015   Procedure: Pacemaker Implant;  Surgeon: Anthony Lance, MD;  Location: [facility];  Service: Cardiovascular;  Laterality: N/A;  . L3 Gill  procedure with L3 PLIF with interbody implant L3-4 posterlateral asthrodesis and posterior instrumentation of bone graft    . nasal polyps    . NASAL SEPTUM SURGERY    . VASECTOMY      There were no vitals filed for this visit.   Subjective Assessment - 03/14/17 1016    Subjective  Pt is 67 y/o male who presents to OPPT s/p craniotomy for subdural hematoma evacuation on 03/07/17.  Pt was having seizures which found the SDH.  Initial injury in Dec 2018; s/p evacuation of SDH on 01/30/17.  Pt hospitalized in Jan x 10 days and now presents to OPPT for decreased coordination in h Rt hand, as well as some residual balance deficits.    Patient is accompained by:  Family member wife-Anthony Banks    Limitations  Walking    Patient Stated Goals  improve coordination in hand, and balance, notices attention deficits as well    Currently in Pain?  No/denies         [facility] PT Assessment - 03/14/17 1022      Assessment   Medical Diagnosis  SDH s/p evacuation    Referring Provider  Anthony Lose, MD    Onset Date/Surgical Date  03/07/17    Hand Dominance  Right    Next MD Visit  03/16/17    Prior Therapy  while in hospital      Precautions  Precautions  Fall      Restrictions   Weight Bearing Restrictions  No      Balance Screen   Has the patient fallen in the past 6 months  No    Has the patient had a decrease in activity level because of a fear of falling?   No    Is the patient reluctant to leave their home because of a fear of falling?   No      Home Environment   Living Environment  Private residence    Living Arrangements  Spouse/significant other    Type of [place] Access  Level entry    [place]  Two level;Bed/bath upstairs    Alternate Level Stairs-Number of Steps  15    Alternate Level Stairs-Rails  Right    [place] - standard;Walker - 2 wheels;Bedside commode    Additional Comments  no longer using DME at home      Prior Function   Level of  Independence  Independent    Vocation  Retired    [employer]  retired from [place], gardening      Cognition   Overall Cognitive Status  Within Functional Limits for tasks assessed      ROM / Strength   AROM / PROM / Strength  Strength      Strength   Overall Strength Comments  tested in sitting    Strength Assessment Site  Hip;Knee;Ankle    Right/Left Hip  Right;Left    Right Hip Flexion  3/5    Left Hip Flexion  3/5    Right/Left Knee  Right;Left    Right Knee Flexion  4/5    Right Knee Extension  5/5    Left Knee Flexion  4/5    Left Knee Extension  5/5    Right/Left Ankle  Right;Left    Right Ankle Dorsiflexion  5/5    Left Ankle Dorsiflexion  5/5      Ambulation/Gait   Ambulation/Gait  Yes    Ambulation/Gait Assistance  5: Supervision    Ambulation Distance (Feet)  250 Feet    Assistive device  None    Gait Pattern  Wide base of support mild ataxia    Ambulation Surface  Level;Indoor    Gait velocity  3.34 ft/sec 40m: 9.81 sec      Standardized Balance Assessment   Standardized Balance Assessment  Berg Balance Test;Dynamic Gait Index      Berg Balance Test   Sit to Stand  Able to stand without using hands and stabilize independently    Standing Unsupported  Able to stand safely 2 minutes    Sitting with Back Unsupported but Feet Supported on Floor or Stool  Able to sit safely and securely 2 minutes    Stand to Sit  Sits safely with minimal use of hands    Transfers  Able to transfer safely, minor use of hands    Standing Unsupported with Eyes Closed  Able to stand 10 seconds with supervision    Standing Ubsupported with Feet Together  Able to place feet together independently and stand for 1 minute with supervision    From Standing, Reach Forward with Outstretched Arm  Can reach forward >12 cm safely (5")    From Standing Position, Pick up Object from Floor  Able to pick up shoe safely and easily    From Standing  Position,  Turn to Look Behind Over each Shoulder  Looks behind from both sides and weight shifts well    Turn 360 Degrees  Needs close supervision or verbal cueing    Standing Unsupported, Alternately Place Feet on Step/Stool  Able to complete 4 steps without aid or supervision    Standing Unsupported, One Foot in [place] to plae foot ahead of the other independently and hold 30 seconds    Standing on One Leg  Able to lift leg independently and hold 5-10 seconds    Total Score  46      Dynamic Gait Index   Level Surface  Mild Impairment    Change in Gait Speed  Normal    Gait with Horizontal Head Turns  Moderate Impairment    Gait with Vertical Head Turns  Moderate Impairment    Gait and Pivot Turn  Normal    Step Over Obstacle  Mild Impairment    Step Around Obstacles  Normal    Steps  Mild Impairment    Total Score  17             Objective measurements completed on examination: See above findings.              PT Education - 03/14/17 1056    Education provided  Yes    Education Details  clinical findings; POC, goals of care, recommendation for ST/OT    Person(s) Educated  Patient;Spouse    Methods  Explanation    Comprehension  Verbalized understanding       PT Short Term Goals - 03/14/17 1217      PT SHORT TERM GOAL #1   Title  amb > 350' on various indoor/outdoor surfaces with supervision for improved functional mobility    Status  New    Target Date  04/11/17      PT SHORT TERM GOAL #2   Title  improve dynamic gait index to >/= 20/24 for improved balance and decreased fall risk    Status  New    Target Date  04/11/17      PT SHORT TERM GOAL #3   Title  independent with initial HEP    Status  New    Target Date  04/11/17        PT Long Term Goals - 03/14/17 1218      PT LONG TERM GOAL #1   Title  independent with advanced HEP    Status  New    Target Date  05/09/17      PT LONG TERM GOAL #2   Title  improve dynamic gait index to >/=  22/24 for improved community access and balance    Status  New    Target Date  05/09/17      PT LONG TERM GOAL #3   Title  improve BERG balance score to >/= 53/56 for improved balance and decreased fall risk    Status  New    Target Date  05/09/17      PT LONG TERM GOAL #4   Title  perform simulated golf activities independently for improved balance and function    Status  New    Target Date  05/09/17      PT LONG TERM GOAL #5   Title  amb > 500' on various indoor/outdoor surfaces independently for improved community access    Status  New    Target Date  05/09/17  Plan - 03/14/17 1212    Clinical Impression Statement  Pt is a 67 y/o male who presents to OPPT s/p SDH with evacuation x 2 (Dec 2018, Jan 2019).  Pt presents today with decreased balance, gait abnormalities and mild strength deficits affecting safe functional mobiltiy.  Pt will benefit from PT to address deficits listed.  Pt needed to sit during BERG due to increased c/o lightheadedness which symptoms resolved after ~ 2 min.    History and Personal Factors relevant to plan of care:  DDD s/p lumbar fusion, syncope s/p pacemaker, SDH s/p evacuation x 2 (SDH of unknown origin)    Clinical Presentation  Unstable    Clinical Presentation due to:  SDH of unknown origin, possible likelihood of return; lightheadedness during eval needed to rest    Clinical Decision Making  High    Rehab Potential  Good    PT Frequency  2x / week    PT Duration  8 weeks    PT Treatment/Interventions  ADLs/Self Care Home Management;Cryotherapy;Moist Heat;Therapeutic exercise;Therapeutic activities;Functional mobility training;Stair training;Gait training;Patient/family education;Balance training;Neuromuscular re-education;Vestibular    PT Next Visit Plan  establish balance HEP (corner balance and countertop exercises), hip strengthening, dynamic gait activities    Recommended Other Services  OT/ST-referral given to pt to take to MD  appt this week    Consulted and Agree with Plan of Care  Patient;Family member/caregiver    Family Member Consulted  wife-Anthony Banks       Patient will benefit from skilled therapeutic intervention in order to improve the following deficits and impairments:  Abnormal gait, Decreased balance, Decreased coordination, Difficulty walking, Decreased mobility, Decreased strength  Visit Diagnosis: Other abnormalities of gait and mobility - Plan: PT plan of care cert/re-cert  Other symptoms and signs involving the nervous system - Plan: PT plan of care cert/re-cert  Unsteadiness on feet - Plan: PT plan of care cert/re-cert     Problem List Patient Active Problem List   Diagnosis Date Noted  . Seizure ([place]) 03/02/2017  . Subdural hematoma ([place]) 01/30/2017  . Symptomatic bradycardia 11/29/2015  . Sinus bradycardia 11/29/2015  . Pure hypercholesterolemia       Laureen Abrahams, PT, DPT 03/14/17 12:24 PM    [facility] [address] [place], [place], [address] Phone: [phone]   Fax:  [phone]  Name: Anthony Banks MRN: [id number] Date of Birth: 05/22/1950

## 2017-03-15 DIAGNOSIS — R569 Unspecified convulsions: Secondary | ICD-10-CM | POA: Diagnosis not present

## 2017-03-17 ENCOUNTER — Encounter: Payer: Self-pay | Admitting: Neurology

## 2017-03-17 ENCOUNTER — Other Ambulatory Visit: Payer: Self-pay

## 2017-03-17 ENCOUNTER — Ambulatory Visit: Payer: Medicare HMO | Admitting: Neurology

## 2017-03-17 VITALS — BP 118/64 | HR 74 | Ht 78.0 in | Wt 248.0 lb

## 2017-03-17 DIAGNOSIS — G40119 Localization-related (focal) (partial) symptomatic epilepsy and epileptic syndromes with simple partial seizures, intractable, without status epilepticus: Secondary | ICD-10-CM | POA: Diagnosis not present

## 2017-03-17 DIAGNOSIS — S065X9A Traumatic subdural hemorrhage with loss of consciousness of unspecified duration, initial encounter: Secondary | ICD-10-CM

## 2017-03-17 DIAGNOSIS — S065XAA Traumatic subdural hemorrhage with loss of consciousness status unknown, initial encounter: Secondary | ICD-10-CM

## 2017-03-17 MED ORDER — PHENYTOIN SODIUM EXTENDED 100 MG PO CAPS: 100.0000 mg | ORAL_CAPSULE | Freq: Three times a day (TID) | ORAL | 5 refills | Status: DC

## 2017-03-17 MED ORDER — OXCARBAZEPINE 300 MG PO TABS: ORAL_TABLET | ORAL | 5 refills | Status: DC
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
## 2017-03-17 NOTE — Patient Instructions (Addendum)
1. Start Trileptal 300mg : take 1/2 tablet twice a day for a week, then increase to 1 tablet twice a day for a week, then increase to 2 tablets twice a day and continue  2. Continue Keppra 1000mg  twice a day for now, until 3 weeks (when you are on Trileptal 600mg  twice a day), then reduce to 1/2 tablet twice a day for a week, then 1/2 tablet at night for a week, then stop  3. Continue Dilantin 100mg  three times a day  4. May take Ativan 2mg  1/2 tablet as needed for seizure clusters (back to back seizures)  5. Continue to keep a calendar of the seizures, follow-up in 2 months, call for any changes  Seizure Precautions: 1. If medication has been prescribed for you to prevent seizures, take it exactly as directed.  Do not stop taking the medicine without talking to your doctor first, even if you have not had a seizure in a long time.   2. Avoid activities in which a seizure would cause danger to yourself or to others.  Don't operate dangerous machinery, swim alone, or climb in high or dangerous places, such as on ladders, roofs, or girders.  Do not drive unless your doctor says you may.  3. If you have any warning that you may have a seizure, lay down in a safe place where you can't hurt yourself.    4.  No driving for 6 months from last seizure, as per [facility].   Please refer to the following link on the [place] website for more information: http://www.epilepsyfoundation.org/answerplace/Social/driving/drivingu.cfm   5.  Maintain good sleep hygiene. Avoid alcohol.  6.  Contact your doctor if you have any problems that may be related to the medicine you are taking.  7.  Call 911 and bring the patient back to the ED if:        A.  The seizure lasts longer than 5 minutes.       B.  The patient doesn't awaken shortly after the seizure  C.  The patient has new problems such as difficulty seeing, speaking or moving  D.  The patient was injured during the  seizure  E.  The patient has a temperature over 102 F (39C)  F.  The patient vomited and now is having trouble breathing

## 2017-03-20 ENCOUNTER — Encounter: Payer: Self-pay | Admitting: Physical Therapy

## 2017-03-20 ENCOUNTER — Ambulatory Visit: Payer: Medicare HMO | Attending: Neurosurgery | Admitting: Physical Therapy

## 2017-03-20 DIAGNOSIS — R29818 Other symptoms and signs involving the nervous system: Secondary | ICD-10-CM | POA: Insufficient documentation

## 2017-03-20 DIAGNOSIS — R278 Other lack of coordination: Secondary | ICD-10-CM | POA: Diagnosis not present

## 2017-03-20 DIAGNOSIS — R2681 Unsteadiness on feet: Secondary | ICD-10-CM | POA: Diagnosis not present

## 2017-03-20 DIAGNOSIS — R2689 Other abnormalities of gait and mobility: Secondary | ICD-10-CM

## 2017-03-20 NOTE — Therapy (Signed)
[facility] [address] [place], [place], [address] Phone: [phone]   Fax:  [phone]  Physical Therapy Treatment  Patient Details  Name: Anthony Banks MRN: [id number] Date of Birth: Oct 01, 1950 Referring Provider: Consuella Lose, MD   Encounter Date: 03/20/2017  PT End of Session - 03/20/17 0925    Visit Number  2    Number of Visits  16    Date for PT Re-Evaluation  05/09/17    Authorization Type  Aetna Medicare $40 copay    PT Start Time  0845    PT Stop Time  0924    PT Time Calculation (min)  39 min    Equipment Utilized During Treatment  Gait belt    Activity Tolerance  Patient tolerated treatment well    Behavior During Therapy  [facility] for tasks assessed/performed       Past Medical History:  Diagnosis Date  . Borderline hyperlipidemia   . Cancer ([place])    basal face Dr [name]  . Lumbar degenerative disc disease   . Nummular eczema   . Overweight   . Subdural hematoma ([place])   . Syncope     Past Surgical History:  Procedure Laterality Date  . basal/squamous cell face    . bilateral blepharoplasy    . COLONOSCOPY     polyps remove  . COLONOSCOPY WITH PROPOFOL N/A 11/24/2014   Procedure: COLONOSCOPY WITH PROPOFOL;  Surgeon: Garlan Fair, MD;  [facility];  Service: Endoscopy;  Laterality: N/A;  . CRANIOTOMY N/A 01/30/2017   Procedure: CRANIOTOMY HEMATOMA EVACUATION SUBDURAL;  Surgeon: Consuella Lose, MD;  Location: [facility];  Service: Neurosurgery;  Laterality: N/A;  CRANIOTOMY HEMATOMA EVACUATION SUBDURAL  . CRANIOTOMY Left 03/07/2017   Procedure: CRANIOTOMY HEMATOMA EVACUATION SUBDURAL;  Surgeon: Consuella Lose, MD;  Location: [facility];  Service: Neurosurgery;  Laterality: Left;  . EP IMPLANTABLE DEVICE N/A 11/30/2015   Procedure: Pacemaker Implant;  Surgeon: Evans Lance, MD;  Location: [facility];  Service: Cardiovascular;  Laterality: N/A;  . L3 Gill procedure  with L3 PLIF with interbody implant L3-4 posterlateral asthrodesis and posterior instrumentation of bone graft    . nasal polyps    . NASAL SEPTUM SURGERY    . VASECTOMY      There were no vitals filed for this visit.  Subjective Assessment - 03/20/17 0847    Subjective  went to neurologist on Friday; plan is to wean off [place] and try new seizure medication.  no issues over the weekend; but reports he had a seizure on Friday (couldn't speak very well)    Limitations  Walking    Patient Stated Goals  improve coordination in hand, and balance, notices attention deficits as well    Currently in Pain?  No/denies            SINGLE LIMB STANCE    Stance: single leg on floor. Raise leg. Hold _10-15__ seconds. Repeat with other leg.  STAND BY COUNTER FOR SAFETY. _3-5__ reps per set, _2-3__ sets per day, _7__ days per week  Copyright  VHI. All rights reserved.   Tandem Stance    Right foot in front of left, heel touching toe both feet "straight ahead". Balance in this position _10-15_ seconds. STAND BY COUNTER FOR SAFETY. Do with left foot in front of right. Perform 2-3 times on each leg; 2-3 sessions per day.  Feet Heel-Toe "Tandem"    Arms over counter, walk a straight line  bringing one foot directly in front of the other. Repeat for _2-3___ laps along counter per session. Do __2-3__ sessions per day.   Walking Head Turn    Standing close to a wall, walk __20-30__ feet while turning head side to side. Touch wall if necessary to keep balance.  Repeat looking up and down. Repeat _2-3__ times. Do __2-3__ sessions per day.           [facility] Adult PT Treatment/Exercise - 03/20/17 0924      Exercises   Exercises  Knee/Hip      Knee/Hip Exercises: Standing   SLS  single tap/double tap/kicking over; uprighting with min A          Balance Exercises - 03/20/17 0925      Balance Exercises: Standing   Balance Beam  red beam: horizontal/vertical head turns  and alt taps forward with intermiittent UE support and min A        PT Education - 03/20/17 0925    Education provided  Yes    Education Details  HEP    Person(s) Educated  Patient    Methods  Explanation;Demonstration;Handout    Comprehension  Verbalized understanding;Returned demonstration;Need further instruction       PT Short Term Goals - 03/14/17 1217      PT SHORT TERM GOAL #1   Title  amb > 350' on various indoor/outdoor surfaces with supervision for improved functional mobility    Status  New    Target Date  04/11/17      PT SHORT TERM GOAL #2   Title  improve dynamic gait index to >/= 20/24 for improved balance and decreased fall risk    Status  New    Target Date  04/11/17      PT SHORT TERM GOAL #3   Title  independent with initial HEP    Status  New    Target Date  04/11/17        PT Long Term Goals - 03/14/17 1218      PT LONG TERM GOAL #1   Title  independent with advanced HEP    Status  New    Target Date  05/09/17      PT LONG TERM GOAL #2   Title  improve dynamic gait index to >/= 22/24 for improved community access and balance    Status  New    Target Date  05/09/17      PT LONG TERM GOAL #3   Title  improve BERG balance score to >/= 53/56 for improved balance and decreased fall risk    Status  New    Target Date  05/09/17      PT LONG TERM GOAL #4   Title  perform simulated golf activities independently for improved balance and function    Status  New    Target Date  05/09/17      PT LONG TERM GOAL #5   Title  amb > 500' on various indoor/outdoor surfaces independently for improved community access    Status  New    Target Date  05/09/17            Plan - 03/20/17 0926    Clinical Impression Statement  Pt tolerated session well today with focus on balance as well as establishing HEP.  Will cont to benefit from PT to maximize function.    PT Next Visit Plan  review HEP, hip strengthening, dynamic gait activities, balance  activities  Consulted and Agree with Plan of Care  Patient       Patient will benefit from skilled therapeutic intervention in order to improve the following deficits and impairments:  Abnormal gait, Decreased balance, Decreased coordination, Difficulty walking, Decreased mobility, Decreased strength  Visit Diagnosis: Other abnormalities of gait and mobility  Other symptoms and signs involving the nervous system  Unsteadiness on feet     Problem List Patient Active Problem List   Diagnosis Date Noted  . Seizure ([place]) 03/02/2017  . Subdural hematoma ([place]) 01/30/2017  . Symptomatic bradycardia 11/29/2015  . Sinus bradycardia 11/29/2015  . Pure hypercholesterolemia       Laureen Abrahams, PT, DPT 03/20/17 9:27 AM    [facility] [address] [place], [place], [address] Phone: [phone]   Fax:  [phone]  Name: Anthony Banks MRN: [id number] Date of Birth: November 26, 1950

## 2017-03-20 NOTE — Patient Instructions (Signed)
SINGLE LIMB STANCE    Stance: single leg on floor. Raise leg. Hold _10-15__ seconds. Repeat with other leg.  STAND BY COUNTER FOR SAFETY. _3-5__ reps per set, _2-3__ sets per day, _7__ days per week  Copyright  VHI. All rights reserved.   Tandem Stance    Right foot in front of left, heel touching toe both feet "straight ahead". Balance in this position _10-15_ seconds. STAND BY COUNTER FOR SAFETY. Do with left foot in front of right. Perform 2-3 times on each leg; 2-3 sessions per day.  Feet Heel-Toe "Tandem"    Arms over counter, walk a straight line bringing one foot directly in front of the other. Repeat for _2-3___ laps along counter per session. Do __2-3__ sessions per day.   Walking Head Turn    Standing close to a wall, walk __20-30__ feet while turning head side to side. Touch wall if necessary to keep balance.  Repeat looking up and down. Repeat _2-3__ times. Do __2-3__ sessions per day.

## 2017-03-21 ENCOUNTER — Ambulatory Visit: Payer: Medicare HMO | Admitting: Neurology

## 2017-03-22 ENCOUNTER — Ambulatory Visit: Payer: Medicare HMO | Admitting: Physical Therapy

## 2017-03-22 ENCOUNTER — Other Ambulatory Visit: Payer: Self-pay

## 2017-03-22 ENCOUNTER — Encounter: Payer: Self-pay | Admitting: Physical Therapy

## 2017-03-22 ENCOUNTER — Ambulatory Visit: Payer: Medicare HMO | Admitting: Occupational Therapy

## 2017-03-22 DIAGNOSIS — R29818 Other symptoms and signs involving the nervous system: Secondary | ICD-10-CM

## 2017-03-22 DIAGNOSIS — R278 Other lack of coordination: Secondary | ICD-10-CM | POA: Diagnosis not present

## 2017-03-22 DIAGNOSIS — R2689 Other abnormalities of gait and mobility: Secondary | ICD-10-CM | POA: Diagnosis not present

## 2017-03-22 DIAGNOSIS — R2681 Unsteadiness on feet: Secondary | ICD-10-CM

## 2017-03-22 NOTE — Therapy (Signed)
[facility] [address] [place], [place], [address] Phone: [phone]   Fax:  [phone]  Physical Therapy Treatment  Patient Details  Name: Anthony Banks MRN: [id number] Date of Birth: 1950/02/23 Referring Provider: Consuella Lose, MD   Encounter Date: 03/22/2017  PT End of Session - 03/22/17 1006    Visit Number  3    Number of Visits  16    Date for PT Re-Evaluation  05/09/17    Authorization Type  Aetna Medicare $40 copay    PT Start Time  0931    PT Stop Time  1012    PT Time Calculation (min)  41 min    Equipment Utilized During Treatment  Gait belt    Activity Tolerance  Patient tolerated treatment well fatigues quickly    Behavior During Therapy  [facility] for tasks assessed/performed       Past Medical History:  Diagnosis Date  . Borderline hyperlipidemia   . Cancer ([place])    basal face Dr [name]  . Lumbar degenerative disc disease   . Nummular eczema   . Overweight   . Subdural hematoma ([place])   . Syncope     Past Surgical History:  Procedure Laterality Date  . basal/squamous cell face    . bilateral blepharoplasy    . COLONOSCOPY     polyps remove  . COLONOSCOPY WITH PROPOFOL N/A 11/24/2014   Procedure: COLONOSCOPY WITH PROPOFOL;  Surgeon: Garlan Fair, MD;  [facility];  Service: Endoscopy;  Laterality: N/A;  . CRANIOTOMY N/A 01/30/2017   Procedure: CRANIOTOMY HEMATOMA EVACUATION SUBDURAL;  Surgeon: Consuella Lose, MD;  Location: [facility];  Service: Neurosurgery;  Laterality: N/A;  CRANIOTOMY HEMATOMA EVACUATION SUBDURAL  . CRANIOTOMY Left 03/07/2017   Procedure: CRANIOTOMY HEMATOMA EVACUATION SUBDURAL;  Surgeon: Consuella Lose, MD;  Location: [facility];  Service: Neurosurgery;  Laterality: Left;  . EP IMPLANTABLE DEVICE N/A 11/30/2015   Procedure: Pacemaker Implant;  Surgeon: Evans Lance, MD;  Location: [facility];  Service: Cardiovascular;  Laterality: N/A;  .  L3 Gill procedure with L3 PLIF with interbody implant L3-4 posterlateral asthrodesis and posterior instrumentation of bone graft    . nasal polyps    . NASAL SEPTUM SURGERY    . VASECTOMY      There were no vitals filed for this visit.  Subjective Assessment - 03/22/17 0933    Subjective  doing well; feels speech is improving.  feels dilantin affects speech and balance.    Patient Stated Goals  improve coordination in hand, and balance, notices attention deficits as well    Currently in Pain?  No/denies                      [facility] Adult PT Treatment/Exercise - 03/22/17 1006      Knee/Hip Exercises: Aerobic   Stepper  SciFit L3.5 x 5 min for endurance          Balance Exercises - 03/22/17 0950      Balance Exercises: Standing   Standing Eyes Closed  Foam/compliant surface;Wide (BOA);3 reps;10 secs    Tandem Stance  Eyes open;Intermittent upper extremity support;4 reps;10 secs    SLS  Eyes open;Solid surface;4 reps;10 secs    Wall Bumps  Hip    Wall Bumps-Hips  Anterior/posterior;Foam/compliant surface;Eyes opened;Eyes closed;10 reps    Rockerboard  Anterior/posterior;Lateral;Head turns;10 reps;Intermittent UE support min A and cues for midline awareness    Tandem Gait  Forward;4 reps;Upper extremity support          PT Short Term Goals - 03/14/17 1217      PT SHORT TERM GOAL #1   Title  amb > 350' on various indoor/outdoor surfaces with supervision for improved functional mobility    Status  New    Target Date  04/11/17      PT SHORT TERM GOAL #2   Title  improve dynamic gait index to >/= 20/24 for improved balance and decreased fall risk    Status  New    Target Date  04/11/17      PT SHORT TERM GOAL #3   Title  independent with initial HEP    Status  New    Target Date  04/11/17        PT Long Term Goals - 03/14/17 1218      PT LONG TERM GOAL #1   Title  independent with advanced HEP    Status  New    Target Date  05/09/17      PT LONG  TERM GOAL #2   Title  improve dynamic gait index to >/= 22/24 for improved community access and balance    Status  New    Target Date  05/09/17      PT LONG TERM GOAL #3   Title  improve BERG balance score to >/= 53/56 for improved balance and decreased fall risk    Status  New    Target Date  05/09/17      PT LONG TERM GOAL #4   Title  perform simulated golf activities independently for improved balance and function    Status  New    Target Date  05/09/17      PT LONG TERM GOAL #5   Title  amb > 500' on various indoor/outdoor surfaces independently for improved community access    Status  New    Target Date  05/09/17            Plan - 03/22/17 1007    Clinical Impression Statement  Pt demonstrated independence with initial HEP today and continued to work on balance and midline awareness (tends to have post lean).  Will continue to benefit from PT to maximize function.    PT Treatment/Interventions  ADLs/Self Care Home Management;Cryotherapy;Moist Heat;Therapeutic exercise;Therapeutic activities;Functional mobility training;Stair training;Gait training;Patient/family education;Balance training;Neuromuscular re-education;Vestibular    PT Next Visit Plan  hip strengthening, dynamic gait activities, balance activities    Consulted and Agree with Plan of Care  Patient       Patient will benefit from skilled therapeutic intervention in order to improve the following deficits and impairments:  Abnormal gait, Decreased balance, Decreased coordination, Difficulty walking, Decreased mobility, Decreased strength  Visit Diagnosis: Other abnormalities of gait and mobility  Other symptoms and signs involving the nervous system  Unsteadiness on feet     Problem List Patient Active Problem List   Diagnosis Date Noted  . Seizure ([place]) 03/02/2017  . Subdural hematoma ([place]) 01/30/2017  . Symptomatic bradycardia 11/29/2015  . Sinus bradycardia 11/29/2015  . Pure  hypercholesterolemia       Laureen Abrahams, PT, DPT 03/22/17 10:09 AM    [facility] [address] [place], [place], [address] Phone: [phone]   Fax:  [phone]  Name: Anthony Banks MRN: [id number] Date of Birth: 12-31-1950

## 2017-03-22 NOTE — Therapy (Signed)
[facility] [address] [place], [place], [address] Phone: [phone]   Fax:  [phone]  Occupational Therapy Evaluation  Patient Details  Name: Anthony Banks MRN: [id number] Date of Birth: [birth year] Referring Provider: Darlina Rumpf, MD   Encounter Date: 03/22/2017  OT End of Session - 03/22/17 1201    Visit Number  1    Authorization Type  MCR    OT Start Time  1100    OT Stop Time  1150    OT Time Calculation (min)  50 min    Activity Tolerance  Patient tolerated treatment well    Behavior During Therapy  [facility] for tasks assessed/performed       Past Medical History:  Diagnosis Date  . Borderline hyperlipidemia   . Cancer ([place])    basal face Dr [name]  . Lumbar degenerative disc disease   . Nummular eczema   . Overweight   . Subdural hematoma ([place])   . Syncope     Past Surgical History:  Procedure Laterality Date  . basal/squamous cell face    . bilateral blepharoplasy    . COLONOSCOPY     polyps remove  . COLONOSCOPY WITH PROPOFOL N/A 11/24/2014   Procedure: COLONOSCOPY WITH PROPOFOL;  Surgeon: Garlan Fair, MD;  [facility];  Service: Endoscopy;  Laterality: N/A;  . CRANIOTOMY N/A 01/30/2017   Procedure: CRANIOTOMY HEMATOMA EVACUATION SUBDURAL;  Surgeon: Consuella Lose, MD;  Location: [facility];  Service: Neurosurgery;  Laterality: N/A;  CRANIOTOMY HEMATOMA EVACUATION SUBDURAL  . CRANIOTOMY Left 03/07/2017   Procedure: CRANIOTOMY HEMATOMA EVACUATION SUBDURAL;  Surgeon: Consuella Lose, MD;  Location: [facility];  Service: Neurosurgery;  Laterality: Left;  . EP IMPLANTABLE DEVICE N/A 11/30/2015   Procedure: Pacemaker Implant;  Surgeon: Evans Lance, MD;  Location: [facility];  Service: Cardiovascular;  Laterality: N/A;  . L3 Gill procedure with L3 PLIF with interbody implant L3-4 posterlateral asthrodesis and posterior instrumentation of bone graft    . nasal polyps     . NASAL SEPTUM SURGERY    . VASECTOMY      There were no vitals filed for this visit.  Subjective Assessment - 03/22/17 1108    Subjective   My RT hand was giving me more trouble a couple weeks ago, and I was clumsy knocking stuff over with it. Now it's 95% back    Pertinent History  SDH s/p craniotomy x 2 (01/30/17, 03/07/17), seizures, DDD s/p lumbar fusion     Limitations  **pacemaker, seizures, no driving for 6 months, no lifting overhead > 10 lbs    Patient Stated Goals  increase Rt hand coordination and decr. clumsiness    Currently in Pain?  No/denies        [facility] OT Assessment - 03/22/17 0001      Assessment   Medical Diagnosis  SDH s/p evacuation/craniotomy    Referring Provider  Darlina Rumpf, MD    Onset Date/Surgical Date  03/07/17 Previous craniotomy 01/30/17    Hand Dominance  Right    Next MD Visit  -- 03/16/17 most recent      Precautions   Precautions  Fall;ICD/Pacemaker    Precaution Comments  no driving, no lifting > 10 lbs overhead       Restrictions   Weight Bearing Restrictions  No      Balance Screen   Has the patient fallen in the past 6 months  -- see P.T. eval  Home  Environment   Additional Comments  Pt lives with wife in 2 story home with level entry (bedroom/bathroom on 2nd floor, 1/2 bath on first floor)     Lives With  Spouse      Prior Function   Level of Independence  Independent    Vocation  Retired    Biomedical scientist  retired from [employer], gardening      ADL   Eating/Feeding  Modified independent difficulty using Rt hand    Grooming  Independent    Environmental health practitioner  Independent      IADL   Shopping  Needs to be accompanied on any shopping trip    [place]  Does personal laundry completely has  someone to do deep cleaning 1x/wk    Meal Prep  Able to complete simple cold meal and snack prep;Able to complete simple warm meal prep wife did majority of cooking    [employer] Operating [employer] on family or friends for transportation d/t recent seizures    Medication Management  Has difficulty remembering to take medication but pt could do himself with strategies    Financial Management  -- wife always did      Mobility   Mobility Status  Independent      Written Expression   Dominant Hand  Right    Handwriting  -- reports back to normal      Vision - History   Baseline Vision  Wears glasses all the time    Additional Comments  Pt reports some blurred vision in the am due to medications      Sensation   Light Touch  -- Pt reports numbness has cleared Rt hand      Coordination   9 Hole Peg Test  Right;Left    Right 9 Hole Peg Test  32.34 sec    Left 9 Hole Peg Test  32.35 sec    Coordination  Pt demo in hand manipulation with only mild difficulty      Edema   Edema  mild Rt hand at MP joints only      ROM / Strength   AROM / PROM / Strength  AROM;Strength      AROM   Overall AROM Comments  BUE AROM WNL's      Strength   Overall Strength Comments  MMT grossly 5/5 BUE's, Rt sh. flex 4+/5      Hand Function   Right Hand Grip (lbs)  95 lbs    Left Hand Grip (lbs)  80 lbs                      OT Education - 03/22/17 1200    Education provided  Yes    Education Details  Coordination HEP    Person(s) Educated  Patient    Methods  Explanation;Demonstration;Handout    Comprehension  Verbalized understanding;Returned demonstration          OT Long Term Goals - 03/22/17 1207      OT LONG TERM GOAL #1   Title  Independent with coordination HEP     Status  Achieved Met at evaluation            Plan -  03/22/17 1204    Clinical Impression Statement  Pt is a 67 y.o. male who presents to outpatient O.T. s/p SDH and craniotomy x 2. Pt originally had  SDH and craniotomy 01/30/17, but then had seizures/stroke like symptoms and went back into hospital on 03/02/17 with second craniotomy needed on 03/07/17. Pt also has pacemaker. Pt presents today with only mild coordination deficits Rt hand, which pt reports is back to 95%. Pt currently seeing P.T. for balance and has speech evaluation for next week (due to decr. voice volume, mild dysarthria)    Occupational Profile and client history currently impacting functional performance  SDH and craniotomy x 2, seizures, DDD s/p lumbar fusion    Rehab Potential  Excellent    OT Frequency  One time visit    OT Treatment/Interventions  Patient/family education;Therapeutic activities    Plan  No f/u O.T. recommended at this time - pt was given coordination HEP and pt felt comfortable with no f/u OT    Clinical Decision Making  Limited treatment options, no task modification necessary    Consulted and Agree with Plan of Care  Patient       Patient will benefit from skilled therapeutic intervention in order to improve the following deficits and impairments:  Decreased coordination  Visit Diagnosis: Other lack of coordination - Plan: Ot plan of care cert/re-cert    Problem List Patient Active Problem List   Diagnosis Date Noted  . Seizure ([place]) 03/02/2017  . Subdural hematoma ([place]) 01/30/2017  . Symptomatic bradycardia 11/29/2015  . Sinus bradycardia 11/29/2015  . Pure hypercholesterolemia     Carey Bullocks, OTR/L 03/22/2017, 12:12 PM  [facility] [address] [place], [place], [address] Phone: [phone]   Fax:  [phone]  Name: Anthony Banks MRN: [id number] Date of Birth: 1950/08/06

## 2017-03-22 NOTE — Patient Instructions (Signed)
  Coordination Activities  Perform the following activities for 10 minutes 1-2 times per day with right hand(s).   Rotate ball in fingertips (clockwise and counter-clockwise x 3-5 revolutions EACH way).  Toss ball in air and catch with Rt hand.  Deal cards with your thumb (Hold deck in hand and push card off top with thumb).  Rotate one card in hand (clockwise and counter-clockwise x 3-5 revolutions EACH way).  Shuffle cards.  Pick up coins one at a time until you get 5 in your hand, then move coins from palm to fingertips to stack one at a time.  Screw together nuts and bolts, then unfasten.  Practice typing (see Rt handed typing words provided)

## 2017-03-24 ENCOUNTER — Emergency Department (HOSPITAL_COMMUNITY): Payer: Medicare HMO

## 2017-03-24 ENCOUNTER — Other Ambulatory Visit: Payer: Self-pay

## 2017-03-24 ENCOUNTER — Encounter (HOSPITAL_COMMUNITY): Payer: Self-pay | Admitting: Emergency Medicine

## 2017-03-24 DIAGNOSIS — R892 Abnormal level of other drugs, medicaments and biological substances in specimens from other organs, systems and tissues: Secondary | ICD-10-CM | POA: Insufficient documentation

## 2017-03-24 DIAGNOSIS — R002 Palpitations: Secondary | ICD-10-CM | POA: Diagnosis not present

## 2017-03-24 DIAGNOSIS — Z85828 Personal history of other malignant neoplasm of skin: Secondary | ICD-10-CM | POA: Insufficient documentation

## 2017-03-24 DIAGNOSIS — Z79899 Other long term (current) drug therapy: Secondary | ICD-10-CM | POA: Insufficient documentation

## 2017-03-24 DIAGNOSIS — R0602 Shortness of breath: Secondary | ICD-10-CM | POA: Diagnosis not present

## 2017-03-24 DIAGNOSIS — R000 Tachycardia, unspecified: Secondary | ICD-10-CM | POA: Diagnosis present

## 2017-03-24 DIAGNOSIS — R7989 Other specified abnormal findings of blood chemistry: Secondary | ICD-10-CM | POA: Diagnosis not present

## 2017-03-24 LAB — BASIC METABOLIC PANEL
ANION GAP: 12 (ref 5–15)
BUN: 14 mg/dL (ref 6–20)
CALCIUM: 9.6 mg/dL (ref 8.9–10.3)
CHLORIDE: 102 mmol/L (ref 101–111)
CO2: 27 mmol/L (ref 22–32)
Creatinine, Ser: 1.06 mg/dL (ref 0.61–1.24)
GFR calc non Af Amer: >60 mL/min (ref >60)
Glucose, Bld: 136 mg/dL — ABNORMAL HIGH (ref 65–99)
Potassium: 3.9 mmol/L (ref 3.5–5.1)
Sodium: 141 mmol/L (ref 135–145)

## 2017-03-24 LAB — D-DIMER, QUANTITATIVE: D-Dimer, Quant: 1.47 ug/mL-FEU — ABNORMAL HIGH (ref 0.00–0.50)

## 2017-03-24 LAB — CBC
HCT: 42.9 % (ref 39.0–52.0)
HEMOGLOBIN: 14.3 g/dL (ref 13.0–17.0)
MCH: 31.7 pg (ref 26.0–34.0)
MCHC: 33.3 g/dL (ref 30.0–36.0)
MCV: 95.1 fL (ref 78.0–100.0)
Platelets: 214 10*3/uL (ref 150–400)
RBC: 4.51 MIL/uL (ref 4.22–5.81)
RDW: 12.5 % (ref 11.5–15.5)
WBC: 9.1 10*3/uL (ref 4.0–10.5)

## 2017-03-24 LAB — I-STAT TROPONIN, ED: TROPONIN I, POC: 0 ng/mL (ref 0.00–0.08)

## 2017-03-24 NOTE — ED Notes (Signed)
Pt wanting to leave due to wait and states he is feeling better.  Nurse 1st spoke with Dr. Johnney Killian concerning pt's positive D-dimer and the need to stay.  Dr. Johnney Killian reviewed chart and recommended for pt to stay.  Explained to pt the reason he needs to stay and he verbalized understanding.  Attempting to get pt a treatment room.

## 2017-03-24 NOTE — ED Triage Notes (Signed)
Pt reports sudden onset SOB w/i past 30 min. Pt denies CP, cough. Pt has had recent surgery 1/22 (craniotomy)

## 2017-03-25 ENCOUNTER — Emergency Department (HOSPITAL_COMMUNITY)
Admission: EM | Admit: 2017-03-25 | Discharge: 2017-03-25 | Disposition: A | Discharge status: Home / Self Care | Payer: Medicare HMO | Attending: Emergency Medicine | Admitting: Emergency Medicine

## 2017-03-25 ENCOUNTER — Emergency Department (HOSPITAL_COMMUNITY): Payer: Medicare HMO

## 2017-03-25 DIAGNOSIS — Z79899 Other long term (current) drug therapy: Secondary | ICD-10-CM | POA: Diagnosis not present

## 2017-03-25 DIAGNOSIS — R0602 Shortness of breath: Secondary | ICD-10-CM | POA: Diagnosis not present

## 2017-03-25 DIAGNOSIS — R892 Abnormal level of other drugs, medicaments and biological substances in specimens from other organs, systems and tissues: Secondary | ICD-10-CM | POA: Diagnosis not present

## 2017-03-25 DIAGNOSIS — R7989 Other specified abnormal findings of blood chemistry: Secondary | ICD-10-CM | POA: Diagnosis not present

## 2017-03-25 DIAGNOSIS — R7889 Finding of other specified substances, not normally found in blood: Secondary | ICD-10-CM

## 2017-03-25 DIAGNOSIS — R002 Palpitations: Secondary | ICD-10-CM | POA: Diagnosis not present

## 2017-03-25 DIAGNOSIS — Z85828 Personal history of other malignant neoplasm of skin: Secondary | ICD-10-CM | POA: Diagnosis not present

## 2017-03-25 LAB — I-STAT TROPONIN, ED: Troponin i, poc: 0 ng/mL (ref 0.00–0.08)

## 2017-03-25 LAB — PHENYTOIN LEVEL, TOTAL: PHENYTOIN LVL: 30 ug/mL — AB (ref 10.0–20.0)

## 2017-03-25 MED ORDER — IOPAMIDOL (ISOVUE-370) INJECTION 76%
INTRAVENOUS | Status: AC
  Administered 2017-03-25: 100 mL
  Filled 2017-03-25: qty 100

## 2017-03-25 NOTE — ED Notes (Signed)
Pt reports being short of breath.

## 2017-03-25 NOTE — ED Notes (Signed)
Pt updated that room is being cleaned. 

## 2017-03-25 NOTE — Discharge Instructions (Signed)
Please hold your Dilantin (phenytoin) on Saturday.  Your Dilantin level was elevated today at 30.  We discussed your case with poison control who felt you could be discharged home with close outpatient follow-up.  You may resume this medication on Sunday.  Please have your Dilantin level checked by your neurologist on Monday.   You had a normal EKG and 2 normal sets of cardiac labs.  The CT scan of your chest showed no blood clot.  You did have an incidental finding of a small 5 mm right pulmonary nodule but given you are not a smoker this is not concerning but can be followed yearly by your primary care physician.  Your pacemaker was also interrogated and there were no cardiac events noted.  There is a possibility that this is related to anxiety.  No sign of any life-threatening illness today.  Please follow-up closely with your primary care physician.

## 2017-03-25 NOTE — ED Notes (Signed)
Patient ambulated with no distress/ no dizziness, EDP notified .

## 2017-03-25 NOTE — ED Notes (Signed)
Patient transported to CT 

## 2017-03-25 NOTE — ED Provider Notes (Signed)
TIME SEEN: 12:40 AM  CHIEF COMPLAINT: Palpitations, shortness of breath  HPI: Patient is a 67 year old male with history of borderline hyperlipidemia, pacemaker, subdural hematoma requiring craniotomy with evacuation on 01/30/17 and 03/07/17 by Dr. Kathyrn Sheriff with subsequent seizures who presents to the emergency department with several episodes of feeling like his heart was racing and feeling short of breath.  States he feels like these are anxiety attacks.  He reports he has history of anxiety and has felt very anxious since these subdurals.  States he was diaphoretic last night.  No chest tightness, pressure or chest pain.  No nausea or vomiting.  No dizziness.  No fever or cough.  States he is able to walk 2-3 miles a day and never has exertional symptoms.  States that whenever he has these episodes walking around the house makes him feel better.  Denies any new numbness, tingling or focal weakness.  No headache.  States that he was discharged on Keppra and Dilantin and just started Trileptal by his neurologist at [facility] 1 week ago and has not had any seizures since.  He does have a pacemaker for bradycardia.  Denies history of hypertension, diabetes, CAD, PE or DVT.  ROS: See HPI Constitutional: no fever  Eyes: no drainage  ENT: no runny nose   Cardiovascular:  no chest pain  Resp:  SOB  GI: no vomiting GU: no dysuria Integumentary: no rash  Allergy: no hives  Musculoskeletal: no leg swelling  Neurological: no slurred speech ROS otherwise negative  PAST MEDICAL HISTORY/PAST SURGICAL HISTORY:  Past Medical History:  Diagnosis Date  . Borderline hyperlipidemia   . Cancer ([place])    basal face Dr [name]  . Lumbar degenerative disc disease   . Nummular eczema   . Overweight   . Subdural hematoma ([place])   . Syncope     MEDICATIONS:  Prior to Admission medications   Medication Sig Start Date End Date Taking? Authorizing Provider  Ascorbic Acid (VITAMIN C PO) Take 1 tablet by mouth  daily.   Yes [provider]  glucosamine-chondroitin 500-400 MG tablet Take 2 tablets by mouth daily.   Yes [provider]  levETIRAcetam (KEPPRA) 1000 MG tablet Take 1,000 mg by mouth every 12 (twelve) hours.   Yes [provider]  Oxcarbazepine (TRILEPTAL) 300 MG tablet take 1/2 tablet twice a day for a week, then increase to 1 tablet twice a day for a week, then increase to 2 tablets twice a day Patient taking differently: Take 1/2 tablet (150 mg) by mouth two times a day for one week, then increase to 1 tablet (300 mg) two times a day for one week, then increase to 2 tablets (600 mg) two times a day 03/17/17  Yes Cameron Sprang, MD  phenytoin (DILANTIN) 100 MG ER capsule Take 1 capsule (100 mg total) by mouth 3 (three) times daily. 03/17/17  Yes Cameron Sprang, MD  phenytoin (DILANTIN) 50 MG tablet Chew 100 mg by mouth 3 (three) times daily.   Yes [provider]  acetaminophen (TYLENOL) 325 MG tablet Take 325-650 mg by mouth every 6 (six) hours as needed (for pain).    [provider]  levETIRAcetam (KEPPRA) 500 MG tablet Take 1 tablet (500 mg total) by mouth 2 (two) times daily. Patient not taking: Reported on 03/24/2017 02/02/17   Consuella Lose, MD  LORazepam (ATIVAN) 2 MG tablet Take 1-2 mg by mouth daily as needed for seizure.  03/15/17   [provider]  ALLERGIES:  Allergies  Allergen Reactions  . Aspirin Hives and Other (See Comments)    Tight chest  . Morphine Sulfate Anaphylaxis and Swelling    Swelling of the throat  . Nsaids Hives and Other (See Comments)    Chest Pains    SOCIAL HISTORY:  Social History   Tobacco Use  . Smoking status: Never Smoker  . Smokeless tobacco: Never Used  Substance Use Topics  . Alcohol use: Yes    Alcohol/week: 0.6 - 1.2 oz    Types: 1 - 2 Cans of beer per week    FAMILY HISTORY: Family History  Problem Relation Age of Onset  . Asthma Mother   . Asthma Sister   . Cancer  Sister        pancreatic    EXAM: BP 116/80   Pulse 87   Temp 98.3 F (36.8 C) (Oral)   Resp 17   Ht 6\' 7"  (2.007 m)   Wt 111.1 kg (245 lb)   SpO2 96%   BMI 27.60 kg/m  CONSTITUTIONAL: Alert and oriented and responds appropriately to questions. Well-appearing; well-nourished HEAD: Normocephalic, status post left craniotomy with healing surgical incisions without redness, warmth, drainage or bleeding EYES: Conjunctivae clear, pupils appear equal, EOMI ENT: normal nose; moist mucous membranes NECK: Supple, no meningismus, no nuchal rigidity, no LAD  CARD: RRR; S1 and S2 appreciated; no murmurs, no clicks, no rubs, no gallops RESP: Normal chest excursion without splinting or tachypnea; breath sounds clear and equal bilaterally; no wheezes, no rhonchi, no rales, no hypoxia or respiratory distress, speaking full sentences ABD/GI: Normal bowel sounds; non-distended; soft, non-tender, no rebound, no guarding, no peritoneal signs, no hepatosplenomegaly BACK:  The back appears normal and is non-tender to palpation, there is no CVA tenderness EXT: Normal ROM in all joints; non-tender to palpation; no edema; normal capillary refill; no cyanosis, no calf tenderness or swelling    SKIN: Normal color for age and race; warm; no rash NEURO: Moves all extremities equally, strength 5/5 in all 4 extremities, sensation to light touch intact diffusely, cranial nerves II through XII intact, normal speech PSYCH: The patient's mood and manner are appropriate. Grooming and personal hygiene are appropriate.  MEDICAL DECISION MAKING: Patient here with palpitations and shortness of breath.  Asymptomatic currently.  First troponin negative.  EKG shows no ischemic abnormality.  D-dimer and pain in triage was elevated.  Will obtain a CT of his chest to evaluate for PE.  We will also repeat second troponin.  Differential includes ACS, PE.  No sign of volume overload.  Doubt pneumonia.  Anxiety also on the differential.   If second troponin is negative and CT angio negative, patient will be discharged home with outpatient follow-up.  He is comfortable with this plan.   He reports that he has had some slurred speech as well.  States he feels like his speech is not normal now he has no dysarthria or aphasia.  Will check a Dilantin level.  No ataxia.   ED PROGRESS: Patient second troponin is negative.  CTA also negative.  CT does show an incidental 5 mm nodule in the right lower lung.  He has never been a smoker.  He can be followed as an outpatient for this.  We will interrogate his pacemaker as well to ensure no arrhythmia during these episodes where he felt palpitations.  Dilantin level is elevated at 30.  Will discuss with poison control.    3:10 AM  D/w poison control.  They recommend that he hold his Dilantin for 24 hours and then have his level rechecked on Monday.  We will ambulate patient in the ED to ensure no ataxia.   4:15 AM  Pt's pacemaker was interrogated and there is no abnormal events.  Able to ambulate without ataxia.  He will hold his Dilantin for the next 24 hours and then have his level rechecked by his neurologist on Monday.  Patient is comfortable with this plan.  He has no symptoms currently.  I feel he is safe for discharge home.   At this time, I do not feel there is any life-threatening condition present. I have reviewed and discussed all results (EKG, imaging, lab, urine as appropriate) and exam findings with patient/family. I have reviewed nursing notes and appropriate previous records.  I feel the patient is safe to be discharged home without further emergent workup and can continue workup as an outpatient as needed. Discussed usual and customary return precautions. Patient/family verbalize understanding and are comfortable with this plan.  Outpatient follow-up has been provided if needed. All questions have been answered.   EKG Interpretation  Date/Time:  Friday March 24 2017  20:10:09 EST Ventricular Rate:  70 PR Interval:  264 QRS Duration: 96 QT Interval:  376 QTC Calculation: 406 R Axis:   -43 Text Interpretation:  Sinus rhythm with 1st degree A-V block Left axis deviation Incomplete right bundle branch block Abnormal ECG ivcd has resolved Confirmed by Ward, Cyril Mourning [phone]) on 03/25/2017 12:40:38 AM          Ward, Delice Bison, DO 03/25/17 1027

## 2017-03-27 ENCOUNTER — Telehealth: Payer: Self-pay | Admitting: Neurology

## 2017-03-27 ENCOUNTER — Encounter: Payer: Self-pay | Admitting: Neurology

## 2017-03-27 NOTE — Telephone Encounter (Signed)
Patient called regarding his recent visit to the ER. He had a Panic attack when he got there. He said he's never had one. He was very anxious while talking to him. He said they told him at the ER that he had high levels of Dilantin in his blood and to stop taking it. They also told him that he had a high chance of getting a blood Clot. Should patient be seen sooner? Thanks

## 2017-03-27 NOTE — Telephone Encounter (Signed)
Spoke with pt.  He states that he was instructed to only hold on Saturday dilantin and to resume regular dosage on Sunday.  Pt followed these instructions.  Pt  Has already taken his AM dose of Dilantin.  Advised pt to come tomorrow for early morning labs, prior to him taking his AM dilantin.  Pt agreed.

## 2017-03-27 NOTE — Telephone Encounter (Signed)
He was instructed to hold Dilantin until rechecking levels. Pls have him do stat Dilantin level today, then we will discuss what dose he should be on after. What time did he take his Dilantin yesterday? It may be that the level was high because he just took it. [place] for 2/22 appt. Thanks

## 2017-03-28 ENCOUNTER — Telehealth: Payer: Self-pay

## 2017-03-28 ENCOUNTER — Telehealth: Payer: Self-pay | Admitting: Neurology

## 2017-03-28 ENCOUNTER — Ambulatory Visit: Payer: Medicare HMO

## 2017-03-28 ENCOUNTER — Other Ambulatory Visit: Payer: Medicare HMO

## 2017-03-28 DIAGNOSIS — R569 Unspecified convulsions: Secondary | ICD-10-CM | POA: Diagnosis not present

## 2017-03-28 NOTE — Telephone Encounter (Signed)
Lab orders entered

## 2017-03-28 NOTE — Telephone Encounter (Signed)
Pt wanted to know if he can take his dilantin again now that he has had his blood work done, he has been off it for the blood work, please call and advise

## 2017-03-29 ENCOUNTER — Telehealth: Payer: Self-pay | Admitting: Neurology

## 2017-03-29 LAB — TIQ-NTM

## 2017-03-29 LAB — PHENYTOIN LEVEL, TOTAL: PHENYTOIN, TOTAL: 22.3 mg/L — AB (ref 10.0–20.0)

## 2017-03-29 NOTE — Telephone Encounter (Signed)
"  message below" is pt's Dilantin level lab work.  I thought I had attached his results below.  Did not realize phone encounter was in progress.

## 2017-03-29 NOTE — Telephone Encounter (Signed)
Pt left a voicemail message saying he needed a call back ASAP but did not say why

## 2017-03-29 NOTE — Telephone Encounter (Signed)
Spoke with pt relaying message below.  Pt states that he woke up around 1:30AM this morning and could not feel his legs.  He took a Dilantin at that time.  Pt states he also took a Dilantin at 7:30AM.  I advised him to not take any more today and to start tomorrow taking 100mg  tab BID. Confirmed with pt that he has 100mg  capsules.

## 2017-03-30 ENCOUNTER — Ambulatory Visit: Payer: Medicare HMO | Admitting: Speech Pathology

## 2017-03-30 ENCOUNTER — Telehealth: Payer: Self-pay | Admitting: Physical Therapy

## 2017-03-30 ENCOUNTER — Ambulatory Visit: Payer: Medicare HMO | Admitting: Physical Therapy

## 2017-03-30 ENCOUNTER — Encounter: Payer: Self-pay | Admitting: Physical Therapy

## 2017-03-30 DIAGNOSIS — R2689 Other abnormalities of gait and mobility: Secondary | ICD-10-CM

## 2017-03-30 NOTE — Telephone Encounter (Signed)
Dr. Delice Lesch,  We are currently seeing Anthony Banks for physical therapy due SDH and balance deficits. It was noted by the PT that he had a recent ED visit and his dilantin is being adjusted currently. We had asked the pt to get medical clearance to resume PT due to medical changes. He forgot so I am emailing you about whether he is okay to resume PT at this time or continue to hold until medication adjustments are complete. Please advise.   Willow Ora, PTA, [facility] [address] [place], [place] [address] [phone] 03/30/17, 10:49 AM

## 2017-03-30 NOTE — Therapy (Signed)
[facility] [address] [place], [place], [address] Phone: [phone]   Fax:  [phone]  Physical Therapy Treatment  Patient Details  Name: Anthony Banks MRN: [id number] Date of Birth: March 05, 1950 Referring Provider: Darlina Rumpf, MD   Encounter Date: 03/30/2017  PT End of Session - 03/30/17 1035    Visit Number  3 session cancelled due to no resume order from MD    Number of Visits  16    Date for PT Re-Evaluation  05/09/17    Authorization Type  [employer] $40 copay    PT Start Time  1018    PT Stop Time  1030 arrived, no charge    PT Time Calculation (min)  12 min    Equipment Utilized During Treatment  --    Activity Tolerance  --    Behavior During Therapy  --       Past Medical History:  Diagnosis Date  . Borderline hyperlipidemia   . Cancer ([place])    basal face Dr [name]  . Lumbar degenerative disc disease   . Nummular eczema   . Overweight   . Subdural hematoma ([place])   . Syncope     Past Surgical History:  Procedure Laterality Date  . basal/squamous cell face    . bilateral blepharoplasy    . COLONOSCOPY     polyps remove  . COLONOSCOPY WITH PROPOFOL N/A 11/24/2014   Procedure: COLONOSCOPY WITH PROPOFOL;  Surgeon: Garlan Fair, MD;  Location: [facility];  Service: Endoscopy;  Laterality: N/A;  . CRANIOTOMY N/A 01/30/2017   Procedure: CRANIOTOMY HEMATOMA EVACUATION SUBDURAL;  Surgeon: Consuella Lose, MD;  Location: [facility];  Service: Neurosurgery;  Laterality: N/A;  CRANIOTOMY HEMATOMA EVACUATION SUBDURAL  . CRANIOTOMY Left 03/07/2017   Procedure: CRANIOTOMY HEMATOMA EVACUATION SUBDURAL;  Surgeon: Consuella Lose, MD;  Location: [facility];  Service: Neurosurgery;  Laterality: Left;  . EP IMPLANTABLE DEVICE N/A 11/30/2015   Procedure: Pacemaker Implant;  Surgeon: Evans Lance, MD;  Location: [facility];  Service: Cardiovascular;  Laterality: N/A;  . L3 Gill  procedure with L3 PLIF with interbody implant L3-4 posterlateral asthrodesis and posterior instrumentation of bone graft    . nasal polyps    . NASAL SEPTUM SURGERY    . VASECTOMY      There were no vitals filed for this visit.  Subjective Assessment - 03/30/17 1034    Subjective  no new complaints. reports they are still adjusting the dilantin. does not have the resume order from MD.            PT Short Term Goals - 03/14/17 1217      PT SHORT TERM GOAL #1   Title  amb > 350' on various indoor/outdoor surfaces with supervision for improved functional mobility    Status  New    Target Date  04/11/17      PT SHORT TERM GOAL #2   Title  improve dynamic gait index to >/= 20/24 for improved balance and decreased fall risk    Status  New    Target Date  04/11/17      PT SHORT TERM GOAL #3   Title  independent with initial HEP    Status  New    Target Date  04/11/17        PT Long Term Goals - 03/14/17 1218      PT LONG TERM GOAL #1   Title  independent with advanced HEP  Status  New    Target Date  05/09/17      PT LONG TERM GOAL #2   Title  improve dynamic gait index to >/= 22/24 for improved community access and balance    Status  New    Target Date  05/09/17      PT LONG TERM GOAL #3   Title  improve BERG balance score to >/= 53/56 for improved balance and decreased fall risk    Status  New    Target Date  05/09/17      PT LONG TERM GOAL #4   Title  perform simulated golf activities independently for improved balance and function    Status  New    Target Date  05/09/17      PT LONG TERM GOAL #5   Title  amb > 500' on various indoor/outdoor surfaces independently for improved community access    Status  New    Target Date  05/09/17            Plan - 03/30/17 1036    Clinical Impression Statement  Pt arrived today, continues to not have resume orders for PT. See along side Geoffry Paradise, PT who cancelled Tuesday's appt due to lack of resume  orders post ED and dilantin issues. Reinforced with pt need for the okay from the MD to resume PT due to medical changes. Will send note to MD as well.     PT Treatment/Interventions  ADLs/Self Care Home Management;Cryotherapy;Moist Heat;Therapeutic exercise;Therapeutic activities;Functional mobility training;Stair training;Gait training;Patient/family education;Balance training;Neuromuscular re-education;Vestibular    PT Next Visit Plan  once cleared to resume PT: hip strengthening, dynamic gait activities, balance activities    Consulted and Agree with Plan of Care  Patient       Patient will benefit from skilled therapeutic intervention in order to improve the following deficits and impairments:  Abnormal gait, Decreased balance, Decreased coordination, Difficulty walking, Decreased mobility, Decreased strength  Visit Diagnosis: Other abnormalities of gait and mobility     Problem List Patient Active Problem List   Diagnosis Date Noted  . Seizure ([place]) 03/02/2017  . Subdural hematoma ([place]) 01/30/2017  . Symptomatic bradycardia 11/29/2015  . Sinus bradycardia 11/29/2015  . Pure hypercholesterolemia     Willow Ora, [place], [facility] [address] [place], [place] [address] [phone] 03/30/17, 10:38 AM   Name: Anthony Banks MRN: [id number] Date of Birth: Jan 20, 1951

## 2017-03-30 NOTE — Telephone Encounter (Signed)
Thanks for message, it is okay to resume PT at this time. He was instructed to restart the Dilantin at lower dose, and is also on 2 seizure medications in addition to this. Thanks

## 2017-03-31 ENCOUNTER — Ambulatory Visit: Payer: Medicare HMO | Admitting: Physical Therapy

## 2017-04-03 ENCOUNTER — Ambulatory Visit: Payer: Medicare HMO | Admitting: Physical Therapy

## 2017-04-03 ENCOUNTER — Ambulatory Visit (INDEPENDENT_AMBULATORY_CARE_PROVIDER_SITE_OTHER): Payer: Medicare HMO | Admitting: *Deleted

## 2017-04-03 VITALS — BP 113/86 | HR 69

## 2017-04-03 DIAGNOSIS — R2689 Other abnormalities of gait and mobility: Secondary | ICD-10-CM | POA: Diagnosis not present

## 2017-04-03 DIAGNOSIS — R2681 Unsteadiness on feet: Secondary | ICD-10-CM | POA: Diagnosis not present

## 2017-04-03 DIAGNOSIS — R001 Bradycardia, unspecified: Secondary | ICD-10-CM

## 2017-04-03 DIAGNOSIS — R29818 Other symptoms and signs involving the nervous system: Secondary | ICD-10-CM

## 2017-04-03 DIAGNOSIS — R278 Other lack of coordination: Secondary | ICD-10-CM | POA: Diagnosis not present

## 2017-04-03 NOTE — Patient Instructions (Signed)
Overhead Press - STANDING ON BOTH LEGS    Standing on BOTH legs __8__ lb dumbbells at shoulders, alternate arms press to over head.  Do __10__ repetitions, __2__ sets.   Basic Biceps Curl - Heavy resistance (blue) Tubing  Standing (not sitting on ball yet)    Stand feet apart holding tubing, palms facing up. Bend elbows, keeping them near ribs. Anchor in front and low. Do _2__ sets of _10__ repetitions.    ELBOW: Extension (Blue Band)    Standing: Hold band in each hand with elbow straight.  Pull arm back behind you. Hold _1-2__ seconds. Use ___Blue_____ band. _10__ reps per set, _2__ sets per day  Shoulder Retraction With Band    Standing: With band attached in front, pull arms back as if rowing a boat. Hold _1-2___ seconds. Repeat __10__ times. 2 sets.  Wall Sit    Back against wall, slide down so knees are at 90 angle - have a low stool or box under your hips in case you need to sit down. Hold __10__ seconds. Do __6-8__ repetitions.  http://st.exer.us/294   Copyright  VHI. All rights reserved.

## 2017-04-03 NOTE — Progress Notes (Signed)
Remote pacemaker transmission.   

## 2017-04-03 NOTE — Therapy (Signed)
[facility] [address] [place], [place], [address] Phone: [phone]   Fax:  [phone]  Physical Therapy Treatment  Patient Details  Name: ONAJE Banks MRN: [id number] Date of Birth: [birth year] Referring Provider: Darlina Rumpf, MD   Encounter Date: 04/03/2017  PT End of Session - 04/03/17 1322    Visit Number  4    Number of Visits  16    Date for PT Re-Evaluation  05/09/17    Authorization Type  [employer] Medicare $40 copay    PT Start Time  0933    PT Stop Time  1017    PT Time Calculation (min)  44 min    Activity Tolerance  Patient tolerated treatment well    Behavior During Therapy  [facility] for tasks assessed/performed       Past Medical History:  Diagnosis Date  . Borderline hyperlipidemia   . Cancer ([place])    basal face Dr [name]  . Lumbar degenerative disc disease   . Nummular eczema   . Overweight   . Subdural hematoma ([place])   . Syncope     Past Surgical History:  Procedure Laterality Date  . basal/squamous cell face    . bilateral blepharoplasy    . COLONOSCOPY     polyps remove  . COLONOSCOPY WITH PROPOFOL N/A 11/24/2014   Procedure: COLONOSCOPY WITH PROPOFOL;  Surgeon: Garlan Fair, MD;  Location: [facility];  Service: Endoscopy;  Laterality: N/A;  . CRANIOTOMY N/A 01/30/2017   Procedure: CRANIOTOMY HEMATOMA EVACUATION SUBDURAL;  Surgeon: Consuella Lose, MD;  Location: [facility];  Service: Neurosurgery;  Laterality: N/A;  CRANIOTOMY HEMATOMA EVACUATION SUBDURAL  . CRANIOTOMY Left 03/07/2017   Procedure: CRANIOTOMY HEMATOMA EVACUATION SUBDURAL;  Surgeon: Consuella Lose, MD;  Location: [facility];  Service: Neurosurgery;  Laterality: Left;  . EP IMPLANTABLE DEVICE N/A 11/30/2015   Procedure: Pacemaker Implant;  Surgeon: Evans Lance, MD;  Location: [facility];  Service: Cardiovascular;  Laterality: N/A;  . L3 Gill procedure with L3 PLIF with interbody implant L3-4  posterlateral asthrodesis and posterior instrumentation of bone graft    . nasal polyps    . NASAL SEPTUM SURGERY    . VASECTOMY      Vitals:   04/03/17 0940  BP: 113/86  Pulse: 69    Subjective Assessment - 04/03/17 0941    Subjective  Now have verbal order from physician to resume therapies.  Pt denies any issues.  Reports he is ready to begin working out at fitness center but would like guidance on what to work on to maintain UE strength.  Added another pill; notices speech a little different.    Currently in Pain?  No/denies                      [facility] Adult PT Treatment/Exercise - 04/03/17 0948      Exercises   Exercises  Shoulder       Reviewed the following exercises below but with pt performing in bipedal standing with chair behind him in case of posterior LOB  Overhead Press - STANDING ON BOTH LEGS    Standing on BOTH legs __8__ lb dumbbells at shoulders, alternate arms press to over head.  Do __10__ repetitions, __2__ sets.   Basic Biceps Curl - Heavy resistance (blue) Tubing  Standing (not sitting on ball yet)    Stand feet apart holding tubing, palms facing up. Bend elbows, keeping them near ribs. Anchor in  front and low. Do _2__ sets of _10__ repetitions.    ELBOW: Extension (Blue Band)    Standing: Hold band in each hand with elbow straight.  Pull arm back behind you. Hold _1-2__ seconds. Use ___Blue_____ band. _10__ reps per set, _2__ sets per day  Shoulder Retraction With Band    Standing: With band attached in front, pull arms back as if rowing a boat. Hold _1-2___ seconds. Repeat __10__ times. 2 sets.  Wall Sit    Back against wall, slide down so knees are at 90 angle - have a low stool or box under your hips in case you need to sit down. Hold __10__ seconds. Do __6-8__ repetitions.       PT Education - 04/03/17 1322    Education provided  Yes    Education Details  UE and LE strengthening HEP pt can perform at  fitness center    Person(s) Educated  Patient    Methods  Explanation;Demonstration;Handout    Comprehension  Verbalized understanding;Returned demonstration       PT Short Term Goals - 03/14/17 1217      PT SHORT TERM GOAL #1   Title  amb > 350' on various indoor/outdoor surfaces with supervision for improved functional mobility    Status  New    Target Date  04/11/17      PT SHORT TERM GOAL #2   Title  improve dynamic gait index to >/= 20/24 for improved balance and decreased fall risk    Status  New    Target Date  04/11/17      PT SHORT TERM GOAL #3   Title  independent with initial HEP    Status  New    Target Date  04/11/17        PT Long Term Goals - 03/14/17 1218      PT LONG TERM GOAL #1   Title  independent with advanced HEP    Status  New    Target Date  05/09/17      PT LONG TERM GOAL #2   Title  improve dynamic gait index to >/= 22/24 for improved community access and balance    Status  New    Target Date  05/09/17      PT LONG TERM GOAL #3   Title  improve BERG balance score to >/= 53/56 for improved balance and decreased fall risk    Status  New    Target Date  05/09/17      PT LONG TERM GOAL #4   Title  perform simulated golf activities independently for improved balance and function    Status  New    Target Date  05/09/17      PT LONG TERM GOAL #5   Title  amb > 500' on various indoor/outdoor surfaces independently for improved community access    Status  New    Target Date  05/09/17            Plan - 04/03/17 1323    Clinical Impression Statement  Pt cleared to resume therapy and requesting to review exercises pt can do at fitness center to return to LE and UE strengthening.  Discussed restrictions due to hemorrhage and to avoid raising BP, avoiding extreme exertion, use of lower weights and avoiding valsalva.  Provided pt with exercises using lower hand weights and blue theraband with focus on breathing and maintaining balance while  performing in standing.  Pt tolerated well; will continue to progress  towards LTG.    PT Treatment/Interventions  ADLs/Self Care Home Management;Cryotherapy;Moist Heat;Therapeutic exercise;Therapeutic activities;Functional mobility training;Stair training;Gait training;Patient/family education;Balance training;Neuromuscular re-education;Vestibular    PT Next Visit Plan  review UE strengthening HEP; add seated core exercises on physioball?  other LE strengthening exercises pt can perform at fitness center    Consulted and Agree with Plan of Care  Patient       Patient will benefit from skilled therapeutic intervention in order to improve the following deficits and impairments:  Abnormal gait, Decreased balance, Decreased coordination, Difficulty walking, Decreased mobility, Decreased strength  Visit Diagnosis: Other abnormalities of gait and mobility  Other lack of coordination  Other symptoms and signs involving the nervous system  Unsteadiness on feet     Problem List Patient Active Problem List   Diagnosis Date Noted  . Seizure ([place]) 03/02/2017  . Subdural hematoma ([place]) 01/30/2017  . Symptomatic bradycardia 11/29/2015  . Sinus bradycardia 11/29/2015  . Pure hypercholesterolemia    Rico Junker, PT, DPT 04/03/17    1:29 PM    [facility] [address] [place], [place], [address] Phone: [phone]   Fax:  [phone]  Name: Anthony Banks MRN: [id number] Date of Birth: [birth year]

## 2017-04-04 LAB — CUP PACEART REMOTE DEVICE CHECK
Battery Remaining Longevity: 113 mo
Battery Remaining Percentage: 95.5 %
Brady Statistic AP VP Percent: 6.4 %
Brady Statistic AP VS Percent: 51 %
Brady Statistic AS VP Percent: 1.2 %
Brady Statistic RV Percent Paced: 7.5 %
Date Time Interrogation Session: 20190218070014
Implantable Lead Implant Date: 20171016
Implantable Lead Location: 753859
Lead Channel Impedance Value: 460 Ohm
Lead Channel Pacing Threshold Amplitude: 0.5 V
Lead Channel Pacing Threshold Amplitude: 1 V
Lead Channel Sensing Intrinsic Amplitude: 10.8 mV
Lead Channel Sensing Intrinsic Amplitude: 5 mV
Lead Channel Setting Pacing Amplitude: 2 V
Lead Channel Setting Pacing Pulse Width: 0.5 ms
Lead Channel Setting Sensing Sensitivity: 2 mV
MDC IDC LEAD IMPLANT DT: 20171016
MDC IDC LEAD LOCATION: 753860
MDC IDC MSMT BATTERY VOLTAGE: 3.01 V
MDC IDC MSMT LEADCHNL RA PACING THRESHOLD PULSEWIDTH: 0.5 ms
MDC IDC MSMT LEADCHNL RV IMPEDANCE VALUE: 440 Ohm
MDC IDC MSMT LEADCHNL RV PACING THRESHOLD PULSEWIDTH: 0.5 ms
MDC IDC PG IMPLANT DT: 20171016
MDC IDC SET LEADCHNL RV PACING AMPLITUDE: 2.5 V
MDC IDC STAT BRADY AS VS PERCENT: 42 %
MDC IDC STAT BRADY RA PERCENT PACED: 57 %
Pulse Gen Model: 2272
Pulse Gen Serial Number: 7961269

## 2017-04-05 ENCOUNTER — Encounter: Payer: Self-pay | Admitting: Cardiology

## 2017-04-05 ENCOUNTER — Ambulatory Visit: Payer: Medicare HMO | Admitting: Physical Therapy

## 2017-04-05 ENCOUNTER — Encounter: Payer: Self-pay | Admitting: Physical Therapy

## 2017-04-05 DIAGNOSIS — R29818 Other symptoms and signs involving the nervous system: Secondary | ICD-10-CM | POA: Diagnosis not present

## 2017-04-05 DIAGNOSIS — R2689 Other abnormalities of gait and mobility: Secondary | ICD-10-CM

## 2017-04-05 DIAGNOSIS — R278 Other lack of coordination: Secondary | ICD-10-CM

## 2017-04-05 DIAGNOSIS — R2681 Unsteadiness on feet: Secondary | ICD-10-CM | POA: Diagnosis not present

## 2017-04-05 NOTE — Therapy (Signed)
[facility] [address] [place], [place], [address] Phone: [phone]   Fax:  [phone]  Physical Therapy Treatment  Patient Details  Name: Anthony Banks MRN: [id number] Date of Birth: Sep 06, 1950 Referring Provider: Darlina Rumpf, MD   Encounter Date: 04/05/2017  PT End of Session - 04/05/17 1013    Visit Number  5    Number of Visits  16    Date for PT Re-Evaluation  05/09/17    Authorization Type  [employer]na Medicare $40 copay    PT Start Time  0930    PT Stop Time  1012    PT Time Calculation (min)  42 min    Equipment Utilized During Treatment  Gait belt    Activity Tolerance  Patient tolerated treatment well    Behavior During Therapy  [facility] for tasks assessed/performed       Past Medical History:  Diagnosis Date  . Borderline hyperlipidemia   . Cancer ([place])    basal face Dr [name]  . Lumbar degenerative disc disease   . Nummular eczema   . Overweight   . Subdural hematoma ([place])   . Syncope     Past Surgical History:  Procedure Laterality Date  . basal/squamous cell face    . bilateral blepharoplasy    . COLONOSCOPY     polyps remove  . COLONOSCOPY WITH PROPOFOL N/A 11/24/2014   Procedure: COLONOSCOPY WITH PROPOFOL;  Surgeon: Garlan Fair, MD;  [facility];  Service: Endoscopy;  Laterality: N/A;  . CRANIOTOMY N/A 01/30/2017   Procedure: CRANIOTOMY HEMATOMA EVACUATION SUBDURAL;  Surgeon: Consuella Lose, MD;  Location: [facility];  Service: Neurosurgery;  Laterality: N/A;  CRANIOTOMY HEMATOMA EVACUATION SUBDURAL  . CRANIOTOMY Left 03/07/2017   Procedure: CRANIOTOMY HEMATOMA EVACUATION SUBDURAL;  Surgeon: Consuella Lose, MD;  Location: [facility];  Service: Neurosurgery;  Laterality: Left;  . EP IMPLANTABLE DEVICE N/A 11/30/2015   Procedure: Pacemaker Implant;  Surgeon: Evans Lance, MD;  Location: [facility];  Service: Cardiovascular;  Laterality: N/A;  . L3 Gill  procedure with L3 PLIF with interbody implant L3-4 posterlateral asthrodesis and posterior instrumentation of bone graft    . nasal polyps    . NASAL SEPTUM SURGERY    . VASECTOMY      There were no vitals filed for this visit.  Subjective Assessment - 04/05/17 0930    Subjective  doing better; walked 2 miles yesterday with a friend and had no difficulty.  went to fitness center Mon/Tues PM and worked upper body; did well with this following Audra's exercises    Patient Stated Goals  improve coordination in hand, and balance, notices attention deficits as well    Currently in Pain?  No/denies                      [facility] Adult PT Treatment/Exercise - 04/05/17 0934      Knee/Hip Exercises: Stretches   Passive Hamstring Stretch  Both;2 reps;30 seconds      Knee/Hip Exercises: Aerobic   Tread Mill  2.5-3.0 mph x 8 min; HR around 100 bpm throughout      Knee/Hip Exercises: Seated   Long Arc Quad  Both;15 reps on physioball    Other Seated Knee/Hip Exercises  trunk rotation with 8# dumbell x 15 bil on physioball    Marching  Both;15 reps on physioball      Knee/Hip Exercises: Supine   Bridges  15 reps on  physioball    Other Supine Knee/Hip Exercises  bridging with hamstring curl on physioball x 15 reps      Shoulder Exercises: Seated   Other Seated Exercises  bicep curls with opposite LAQ x 10; 8# dumbbells    Other Seated Exercises  overhead press x15 with 8# seated on physioball             PT Education - 04/05/17 1012    Education provided  Yes    Education Details  core exercises for gym    Person(s) Educated  Patient    Methods  Explanation;Demonstration;Handout    Comprehension  Verbalized understanding;Returned demonstration       PT Short Term Goals - 03/14/17 1217      PT SHORT TERM GOAL #1   Title  amb > 350' on various indoor/outdoor surfaces with supervision for improved functional mobility    Status  New    Target Date  04/11/17      PT  SHORT TERM GOAL #2   Title  improve dynamic gait index to >/= 20/24 for improved balance and decreased fall risk    Status  New    Target Date  04/11/17      PT SHORT TERM GOAL #3   Title  independent with initial HEP    Status  New    Target Date  04/11/17        PT Long Term Goals - 03/14/17 1218      PT LONG TERM GOAL #1   Title  independent with advanced HEP    Status  New    Target Date  05/09/17      PT LONG TERM GOAL #2   Title  improve dynamic gait index to >/= 22/24 for improved community access and balance    Status  New    Target Date  05/09/17      PT LONG TERM GOAL #3   Title  improve BERG balance score to >/= 53/56 for improved balance and decreased fall risk    Status  New    Target Date  05/09/17      PT LONG TERM GOAL #4   Title  perform simulated golf activities independently for improved balance and function    Status  New    Target Date  05/09/17      PT LONG TERM GOAL #5   Title  amb > 500' on various indoor/outdoor surfaces independently for improved community access    Status  New    Target Date  05/09/17            Plan - 04/05/17 1013    Clinical Impression Statement  Pt tolerated session well today which consisted of providing core exercises for gym.  Will continue to benefit from PT to maximize function.    PT Treatment/Interventions  ADLs/Self Care Home Management;Cryotherapy;Moist Heat;Therapeutic exercise;Therapeutic activities;Functional mobility training;Stair training;Gait training;Patient/family education;Balance training;Neuromuscular re-education;Vestibular    PT Next Visit Plan  continue balance, strengthening, dynamic gait activities    Consulted and Agree with Plan of Care  Patient       Patient will benefit from skilled therapeutic intervention in order to improve the following deficits and impairments:  Abnormal gait, Decreased balance, Decreased coordination, Difficulty walking, Decreased mobility, Decreased  strength  Visit Diagnosis: Other abnormalities of gait and mobility  Other lack of coordination  Other symptoms and signs involving the nervous system  Unsteadiness on feet     Problem List Patient  Active Problem List   Diagnosis Date Noted  . Seizure ([place]) 03/02/2017  . Subdural hematoma ([place]) 01/30/2017  . Symptomatic bradycardia 11/29/2015  . Sinus bradycardia 11/29/2015  . Pure hypercholesterolemia       Laureen Abrahams, PT, DPT 04/05/17 10:15 AM    [facility] [address] [place], [place], [address] Phone: [phone]   Fax:  [phone]  Name: TIMMEY LAMBA MRN: [id number] Date of Birth: 1950-05-15

## 2017-04-05 NOTE — Patient Instructions (Signed)
PELVIC TILT: Anterior - Sitting March (Therapy Ball)    Sit on therapy ball. Keep back straight. Raise left leg then right to march in place. Alternate. Safety: Have someone assist you as needed.  ___ reps per set, ___ sets per day, ___ days per week   Sitting Knee Extension    Sit on ball. Straighten one knee while keeping balance. Do ___ sets of ___ repetitions. Advanced: Use ___ lb ankle weight.   Overhead Dumbbell Press    Sit on ball holding _8__ lb dumbbells. Press dumbbells above head. Keep palms facing forward. Do _1__ sets of _15__ repetitions.   Trunk Rotation: Sitting (Dumbbell)    Grasp 8# dumbbell with arms horizontal. Rotate trunk by bringing arms across body. Keep pelvis stable. Repeat to other side. Do _1___ sets. Complete _15___ repetitions.   Bridge    Lift hips, keeping pelvis level.  Hold for 5 seconds. Do _15__ times, __1_ times per day.   Gymball: Hamstring Curl (Double Leg)    Lie on back, calves on ball, buttocks on floor. Raise buttocks then roll ball toward buttocks. Repeat _15__ times per set. Lower buttocks to floor between rolls.

## 2017-04-07 ENCOUNTER — Ambulatory Visit: Payer: Medicare HMO | Admitting: Neurology

## 2017-04-10 ENCOUNTER — Encounter: Payer: Self-pay | Admitting: Physical Therapy

## 2017-04-10 ENCOUNTER — Ambulatory Visit: Payer: Medicare HMO | Admitting: Physical Therapy

## 2017-04-10 DIAGNOSIS — R2681 Unsteadiness on feet: Secondary | ICD-10-CM | POA: Diagnosis not present

## 2017-04-10 DIAGNOSIS — R29818 Other symptoms and signs involving the nervous system: Secondary | ICD-10-CM | POA: Diagnosis not present

## 2017-04-10 DIAGNOSIS — R278 Other lack of coordination: Secondary | ICD-10-CM

## 2017-04-10 DIAGNOSIS — R2689 Other abnormalities of gait and mobility: Secondary | ICD-10-CM | POA: Diagnosis not present

## 2017-04-10 NOTE — Therapy (Signed)
[facility] [address] [place], [place], [address] Phone: [phone]   Fax:  [phone]  Physical Therapy Treatment  Patient Details  Name: SADIK PIASCIK MRN: [id number] Date of Birth: [birth year] Referring Provider: Darlina Rumpf, MD   Encounter Date: 04/10/2017  PT End of Session - 04/10/17 1010    Visit Number  6    Number of Visits  16    Date for PT Re-Evaluation  05/09/17    Authorization Type  [employer] Medicare $40 copay    PT Start Time  0930    PT Stop Time  1008    PT Time Calculation (min)  38 min    Equipment Utilized During Treatment  Gait belt    Activity Tolerance  Patient tolerated treatment well    Behavior During Therapy  [facility] for tasks assessed/performed       Past Medical History:  Diagnosis Date  . Borderline hyperlipidemia   . Cancer ([place])    basal face Dr [name]  . Lumbar degenerative disc disease   . Nummular eczema   . Overweight   . Subdural hematoma ([place])   . Syncope     Past Surgical History:  Procedure Laterality Date  . basal/squamous cell face    . bilateral blepharoplasy    . COLONOSCOPY     polyps remove  . COLONOSCOPY WITH PROPOFOL N/A 11/24/2014   Procedure: COLONOSCOPY WITH PROPOFOL;  Surgeon: Garlan Fair, MD;  [facility];  Service: Endoscopy;  Laterality: N/A;  . CRANIOTOMY N/A 01/30/2017   Procedure: CRANIOTOMY HEMATOMA EVACUATION SUBDURAL;  Surgeon: Consuella Lose, MD;  Location: [facility];  Service: Neurosurgery;  Laterality: N/A;  CRANIOTOMY HEMATOMA EVACUATION SUBDURAL  . CRANIOTOMY Left 03/07/2017   Procedure: CRANIOTOMY HEMATOMA EVACUATION SUBDURAL;  Surgeon: Consuella Lose, MD;  Location: [facility];  Service: Neurosurgery;  Laterality: Left;  . EP IMPLANTABLE DEVICE N/A 11/30/2015   Procedure: Pacemaker Implant;  Surgeon: Evans Lance, MD;  Location: [facility];  Service: Cardiovascular;  Laterality: N/A;  . L3 Gill  procedure with L3 PLIF with interbody implant L3-4 posterlateral asthrodesis and posterior instrumentation of bone graft    . nasal polyps    . NASAL SEPTUM SURGERY    . VASECTOMY      There were no vitals filed for this visit.  Subjective Assessment - 04/10/17 0934    Subjective  hasn't had a chance to do new HEP for fitness center because he was out of town.      Patient Stated Goals  improve coordination in hand, and balance, notices attention deficits as well    Currently in Pain?  No/denies         [facility] PT Assessment - 04/10/17 0936      Berg Balance Test   Sit to Stand  Able to stand without using hands and stabilize independently    Standing Unsupported  Able to stand safely 2 minutes    Sitting with Back Unsupported but Feet Supported on Floor or Stool  Able to sit safely and securely 2 minutes    Stand to Sit  Sits safely with minimal use of hands    Transfers  Able to transfer safely, minor use of hands    Standing Unsupported with Eyes Closed  Able to stand 10 seconds safely    Standing Ubsupported with Feet Together  Able to place feet together independently and stand 1 minute safely    From Standing, Reach  Forward with Outstretched Arm  Can reach confidently >25 cm (10")    From Standing Position, Pick up Object from [place] to pick up shoe safely and easily    From Standing Position, Turn to Look Behind Over each Shoulder  Looks behind from both sides and weight shifts well    Turn 360 Degrees  Able to turn 360 degrees safely in 4 seconds or less    Standing Unsupported, Alternately Place Feet on Step/Stool  Able to stand independently and safely and complete 8 steps in 20 seconds    Standing Unsupported, One Foot in Front  Able to plae foot ahead of the other independently and hold 30 seconds    Standing on One Leg  Able to lift leg independently and hold > 10 seconds    Total Score  55      Dynamic Gait Index   Level Surface  Normal    Change in Gait Speed   Normal    Gait with Horizontal Head Turns  Mild Impairment    Gait with Vertical Head Turns  Normal    Gait and Pivot Turn  Normal    Step Over Obstacle  Normal    Step Around Obstacles  Normal    Steps  Normal    Total Score  23                  [facility] Adult PT Treatment/Exercise - 04/10/17 0936      Ambulation/Gait   Ambulation/Gait  Yes    Ambulation/Gait Assistance  7: Independent    Ambulation Distance (Feet)  500 Feet    Assistive device  None    Gait Pattern  Within Functional Limits      Self-Care   Self-Care  Other Self-Care Comments    Other Self-Care Comments   discussed current progress as well as pt's concerns about medication side effects, especially mood and motivation.  Advised pt to monitor how he is feeling (weaning from [place]; so hopeful some of these symptoms will improve) and if they persist recommend talking to neurologist about. Also recommended he go to driving range to practice swinging golf balls.               PT Short Term Goals - 04/10/17 1010      PT SHORT TERM GOAL #1   Title  amb > 350' on various indoor/outdoor surfaces with supervision for improved functional mobility    Status  Achieved      PT SHORT TERM GOAL #2   Title  improve dynamic gait index to >/= 20/24 for improved balance and decreased fall risk    Status  Achieved      PT SHORT TERM GOAL #3   Title  independent with initial HEP    Status  Achieved        PT Long Term Goals - 04/10/17 1010      PT LONG TERM GOAL #1   Title  independent with advanced HEP    Status  On-going    Target Date  05/09/17      PT LONG TERM GOAL #2   Title  improve dynamic gait index to >/= 22/24 for improved community access and balance    Status  Achieved      PT LONG TERM GOAL #3   Title  improve BERG balance score to >/= 53/56 for improved balance and decreased fall risk    Status  Achieved  PT LONG TERM GOAL #4   Title  perform simulated golf activities  independently for improved balance and function    Status  On-going      PT LONG TERM GOAL #5   Title  amb > 500' on various indoor/outdoor surfaces independently for improved community access    Status  Achieved            Plan - 04/10/17 1011    Clinical Impression Statement  Pt has met all STGs and met 3 LTGs.  Anticipate pt will be ready for d/c soon.  Recommended pt hold PT x 2 wks to allow pt to return to community fitness and golf activities, and pt in agreement.  Will likely be ready for d/c next visit if doing well.    PT Treatment/Interventions  ADLs/Self Care Home Management;Cryotherapy;Moist Heat;Therapeutic exercise;Therapeutic activities;Functional mobility training;Stair training;Gait training;Patient/family education;Balance training;Neuromuscular re-education;Vestibular    PT Next Visit Plan  continue balance, strengthening, dynamic gait activities    Consulted and Agree with Plan of Care  Patient       Patient will benefit from skilled therapeutic intervention in order to improve the following deficits and impairments:  Abnormal gait, Decreased balance, Decreased coordination, Difficulty walking, Decreased mobility, Decreased strength  Visit Diagnosis: Other abnormalities of gait and mobility  Other lack of coordination  Other symptoms and signs involving the nervous system  Unsteadiness on feet     Problem List Patient Active Problem List   Diagnosis Date Noted  . Seizure ([place]) 03/02/2017  . Subdural hematoma ([place]) 01/30/2017  . Symptomatic bradycardia 11/29/2015  . Sinus bradycardia 11/29/2015  . Pure hypercholesterolemia       Laureen Abrahams, PT, DPT 04/10/17 10:13 AM     [facility] [address] [place], [place], [address] Phone: [phone]   Fax:  [phone]  Name: EARLEY GROBE MRN: [id number] Date of Birth: 25-Mar-1950

## 2017-04-12 ENCOUNTER — Ambulatory Visit: Payer: Medicare HMO | Admitting: Physical Therapy

## 2017-04-13 ENCOUNTER — Encounter: Payer: Self-pay | Admitting: Internal Medicine

## 2017-04-17 ENCOUNTER — Ambulatory Visit: Payer: Medicare HMO | Admitting: Physical Therapy

## 2017-04-19 ENCOUNTER — Ambulatory Visit: Payer: Medicare HMO | Admitting: Physical Therapy

## 2017-04-24 ENCOUNTER — Encounter: Payer: Self-pay | Admitting: Physical Therapy

## 2017-04-24 ENCOUNTER — Ambulatory Visit: Payer: Medicare HMO | Attending: Neurosurgery | Admitting: Physical Therapy

## 2017-04-24 DIAGNOSIS — R278 Other lack of coordination: Secondary | ICD-10-CM | POA: Diagnosis not present

## 2017-04-24 DIAGNOSIS — R2681 Unsteadiness on feet: Secondary | ICD-10-CM | POA: Insufficient documentation

## 2017-04-24 DIAGNOSIS — R29818 Other symptoms and signs involving the nervous system: Secondary | ICD-10-CM | POA: Diagnosis not present

## 2017-04-24 DIAGNOSIS — R2689 Other abnormalities of gait and mobility: Secondary | ICD-10-CM

## 2017-04-24 NOTE — Therapy (Signed)
[facility] [address] [place], [place], [address] Phone: [phone]   Fax:  [phone]  Physical Therapy Treatment/Discharge  Patient Details  Name: Anthony Banks MRN: [id number] Date of Birth: 1950/04/01 Referring Provider: Darlina Rumpf, MD   Encounter Date: 04/24/2017  PT End of Session - 04/24/17 0909    Visit Number  7    Number of Visits  16    Date for PT Re-Evaluation  05/09/17    Authorization Type  [employer] $40 copay    PT Start Time  0845    PT Stop Time  0904 d/c visit    PT Time Calculation (min)  19 min    Activity Tolerance  Patient tolerated treatment well    Behavior During Therapy  [facility] for tasks assessed/performed       Past Medical History:  Diagnosis Date  . Borderline hyperlipidemia   . Cancer ([place])    basal face Dr [name]  . Lumbar degenerative disc disease   . Nummular eczema   . Overweight   . Subdural hematoma ([place])   . Syncope     Past Surgical History:  Procedure Laterality Date  . basal/squamous cell face    . bilateral blepharoplasy    . COLONOSCOPY     polyps remove  . COLONOSCOPY WITH PROPOFOL N/A 11/24/2014   Procedure: COLONOSCOPY WITH PROPOFOL;  Surgeon: Garlan Fair, MD;  Location: [facility];  Service: Endoscopy;  Laterality: N/A;  . CRANIOTOMY N/A 01/30/2017   Procedure: CRANIOTOMY HEMATOMA EVACUATION SUBDURAL;  Surgeon: Consuella Lose, MD;  Location: [facility];  Service: Neurosurgery;  Laterality: N/A;  CRANIOTOMY HEMATOMA EVACUATION SUBDURAL  . CRANIOTOMY Left 03/07/2017   Procedure: CRANIOTOMY HEMATOMA EVACUATION SUBDURAL;  Surgeon: Consuella Lose, MD;  Location: [facility];  Service: Neurosurgery;  Laterality: Left;  . EP IMPLANTABLE DEVICE N/A 11/30/2015   Procedure: Pacemaker Implant;  Surgeon: Evans Lance, MD;  Location: [facility];  Service: Cardiovascular;  Laterality: N/A;  . L3 Gill procedure with L3 PLIF with interbody  implant L3-4 posterlateral asthrodesis and posterior instrumentation of bone graft    . nasal polyps    . NASAL SEPTUM SURGERY    . VASECTOMY      There were no vitals filed for this visit.  Subjective Assessment - 04/24/17 0846    Subjective  doing well, hit some golf balls last week.  hasn't tried using a driver but feels like he's doing well.  brought videos of him swinging golf clubs-no LOB.  has a lesson this afternoon    Patient Stated Goals  improve coordination in hand, and balance, notices attention deficits as well    Currently in Pain?  No/denies                      [facility] Adult PT Treatment/Exercise - 04/24/17 0907      Self-Care   Other Self-Care Comments   reviewed videos of pt swinging golf clubs; no LOB or signs of instability; also reports fitness center compliance 4x/wk; pt ready for d/c.  Pt with questions about returning to driving and medication side effects-advised to discuss with neurologist             PT Education - 04/24/17 0908    Education provided  Yes    Education Details  see self care    Person(s) Educated  Patient    Methods  Explanation    Comprehension  Verbalized  understanding       PT Short Term Goals - 04/10/17 1010      PT SHORT TERM GOAL #1   Title  amb > 350' on various indoor/outdoor surfaces with supervision for improved functional mobility    Status  Achieved      PT SHORT TERM GOAL #2   Title  improve dynamic gait index to >/= 20/24 for improved balance and decreased fall risk    Status  Achieved      PT SHORT TERM GOAL #3   Title  independent with initial HEP    Status  Achieved        PT Long Term Goals - 04/24/17 0909      PT LONG TERM GOAL #1   Title  independent with advanced HEP    Status  Achieved      PT LONG TERM GOAL #2   Title  improve dynamic gait index to >/= 22/24 for improved community access and balance    Status  Achieved      PT LONG TERM GOAL #3   Title  improve BERG balance  score to >/= 53/56 for improved balance and decreased fall risk    Status  Achieved      PT LONG TERM GOAL #4   Title  perform simulated golf activities independently for improved balance and function    Status  Achieved      PT LONG TERM GOAL #5   Title  amb > 500' on various indoor/outdoor surfaces independently for improved community access    Status  Achieved            Plan - 04/24/17 0909    Clinical Impression Statement  Pt has met all goals and is ready for d/c today.  Pt agreeable to d/c.    PT Treatment/Interventions  ADLs/Self Care Home Management;Cryotherapy;Moist Heat;Therapeutic exercise;Therapeutic activities;Functional mobility training;Stair training;Gait training;Patient/family education;Balance training;Neuromuscular re-education;Vestibular    PT Next Visit Plan  d/c PT today    Consulted and Agree with Plan of Care  Patient       Patient will benefit from skilled therapeutic intervention in order to improve the following deficits and impairments:  Abnormal gait, Decreased balance, Decreased coordination, Difficulty walking, Decreased mobility, Decreased strength  Visit Diagnosis: Other abnormalities of gait and mobility  Other lack of coordination  Other symptoms and signs involving the nervous system  Unsteadiness on feet     Problem List Patient Active Problem List   Diagnosis Date Noted  . Seizure ([place]) 03/02/2017  . Subdural hematoma ([place]) 01/30/2017  . Symptomatic bradycardia 11/29/2015  . Sinus bradycardia 11/29/2015  . Pure hypercholesterolemia       Laureen Abrahams, PT, DPT 04/24/17 9:11 AM    [facility] [address] [place], [place], [address] Phone: [phone]   Fax:  [phone]  Name: Anthony Banks MRN: [id number] Date of Birth: 23-Apr-1950       PHYSICAL THERAPY DISCHARGE SUMMARY  Visits from Start of Care: 7  Current functional level related to  goals / functional outcomes: See above   Remaining deficits: See above   Education / Equipment: HEP  Plan: Patient agrees to discharge.  Patient goals were met. Patient is being discharged due to meeting the stated rehab goals.  ?????     Laureen Abrahams, PT, DPT 04/24/17 9:11 AM  [facility] [address] [place], [place] [address]  [phone] (office) [phone] (  fax)

## 2017-04-26 ENCOUNTER — Ambulatory Visit: Payer: Medicare HMO | Admitting: Physical Therapy

## 2017-05-01 ENCOUNTER — Ambulatory Visit: Payer: Medicare HMO | Admitting: Physical Therapy

## 2017-05-03 ENCOUNTER — Ambulatory Visit: Payer: Medicare HMO | Admitting: Physical Therapy

## 2017-05-03 NOTE — Progress Notes (Signed)
Electrophysiology Office Note Date: 05/03/2017  ID:  Anthony Banks, Anthony Banks [birth year], MRN [id number]  PCP: Lavone Orn, MD Electrophysiologist: Lovena Le  CC: Pacemaker follow-up  Anthony Banks is a 67 y.o. male seen today for Dr Lovena Le.  He presents today for routine electrophysiology followup.  Since last being seen in our clinic, the patient reports doing reasonably well.  He was admitted with SDH and seizures and underwent craniotomy in January of this year. He has done well since without recurrent seizures. He is frustrated that he can't drive.   He denies chest pain, palpitations, dyspnea, PND, orthopnea, nausea, vomiting, dizziness, syncope, edema, weight gain, or early satiety.  Device History: STJ dual chamber PPM implanted 2017 for sinus bradycardia    Past Medical History:  Diagnosis Date  . Borderline hyperlipidemia   . Cancer ([place])    basal face Dr [name]  . Lumbar degenerative disc disease   . Nummular eczema   . Overweight   . Subdural hematoma ([place])   . Syncope    Past Surgical History:  Procedure Laterality Date  . basal/squamous cell face    . bilateral blepharoplasy    . COLONOSCOPY     polyps remove  . COLONOSCOPY WITH PROPOFOL N/A 11/24/2014   Procedure: COLONOSCOPY WITH PROPOFOL;  Surgeon: Garlan Fair, MD;  [facility];  Service: Endoscopy;  Laterality: N/A;  . CRANIOTOMY N/A 01/30/2017   Procedure: CRANIOTOMY HEMATOMA EVACUATION SUBDURAL;  Surgeon: Consuella Lose, MD;  Location: [facility];  Service: Neurosurgery;  Laterality: N/A;  CRANIOTOMY HEMATOMA EVACUATION SUBDURAL  . CRANIOTOMY Left 03/07/2017   Procedure: CRANIOTOMY HEMATOMA EVACUATION SUBDURAL;  Surgeon: Consuella Lose, MD;  Location: [facility];  Service: Neurosurgery;  Laterality: Left;  . EP IMPLANTABLE DEVICE N/A 11/30/2015   Procedure: Pacemaker Implant;  Surgeon: Evans Lance, MD;  Location: [facility];  Service: Cardiovascular;  Laterality: N/A;  . L3 Gill  procedure with L3 PLIF with interbody implant L3-4 posterlateral asthrodesis and posterior instrumentation of bone graft    . nasal polyps    . NASAL SEPTUM SURGERY    . VASECTOMY      Current Outpatient Medications  Medication Sig Dispense Refill  . acetaminophen (TYLENOL) 325 MG tablet Take 325-650 mg by mouth every 6 (six) hours as needed (for pain).    . Ascorbic Acid (VITAMIN C PO) Take 1 tablet by mouth daily.    Marland Kitchen glucosamine-chondroitin 500-400 MG tablet Take 2 tablets by mouth daily.    Marland Kitchen levETIRAcetam (KEPPRA) 1000 MG tablet Take 1,000 mg by mouth every 12 (twelve) hours.    . levETIRAcetam (KEPPRA) 500 MG tablet Take 1 tablet (500 mg total) by mouth 2 (two) times daily. (Patient not taking: Reported on 03/24/2017) 60 tablet 0  . LORazepam (ATIVAN) 2 MG tablet Take 1-2 mg by mouth daily as needed for seizure.     . Oxcarbazepine (TRILEPTAL) 300 MG tablet take 1/2 tablet twice a day for a week, then increase to 1 tablet twice a day for a week, then increase to 2 tablets twice a day (Patient taking differently: Take 1/2 tablet (150 mg) by mouth two times a day for one week, then increase to 1 tablet (300 mg) two times a day for one week, then increase to 2 tablets (600 mg) two times a day) 120 tablet 5  . phenytoin (DILANTIN) 100 MG ER capsule Take 1 capsule (100 mg total) by mouth 3 (three) times daily. 90 capsule 5  .  phenytoin (DILANTIN) 50 MG tablet Chew 100 mg by mouth 3 (three) times daily.     No current facility-administered medications for this visit.     Allergies:   Aspirin; Morphine sulfate; and Nsaids   Social History: Social History   Socioeconomic History  . Marital status: Married    Spouse name: Not on file  . Number of children: Not on file  . Years of education: Not on file  . Highest education level: Not on file  Social Needs  . Financial resource strain: Not on file  . Food insecurity - worry: Not on file  . Food insecurity - inability: Not on file  .  Transportation needs - medical: Not on file  . Transportation needs - non-medical: Not on file  Occupational History  . Not on file  Tobacco Use  . Smoking status: Never Smoker  . Smokeless tobacco: Never Used  Substance and Sexual Activity  . Alcohol use: Yes    Alcohol/week: 0.6 - 1.2 oz    Types: 1 - 2 Cans of beer per week  . Drug use: No  . Sexual activity: Not on file  Other Topics Concern  . Not on file  Social History Narrative   Tobacco Use: Cigarettes: Never Smoked   Non-Smoker for Personal reasons   Alcohol: 1-2 Per week, beer   Exercise: Elliptical 5X a week, wts, body wt   Occupation: Quarry manager   Marital Status: Married    Lives in [place] lives in 2 story home with his wife   Has 2 adult children   Forensic psychologist   Retired Press photographer     Family History: Family History  Problem Relation Age of Onset  . Asthma Mother   . Asthma Sister   . Cancer Sister        pancreatic     Review of Systems: All other systems reviewed and are otherwise negative except as noted above.   Physical Exam: VS:  There were no vitals taken for this visit. , BMI There is no height or weight on file to calculate BMI.  GEN- The patient is well appearing, alert and oriented x 3 today.   HEENT: normocephalic, atraumatic; sclera clear, conjunctiva pink; hearing intact; oropharynx clear; neck supple  Lungs- Clear to ausculation bilaterally, normal work of breathing.  No wheezes, rales, rhonchi Heart- Regular rate and rhythm  GI- soft, non-tender, non-distended, bowel sounds present  Extremities- no clubbing, cyanosis, or edema  MS- no significant deformity or atrophy Skin- warm and dry, no rash or lesion; PPM pocket well healed Psych- euthymic mood, full affect Neuro- strength and sensation are intact  PPM Interrogation- reviewed in detail today,  See PACEART report  EKG:  EKG is ordered today. The ekg ordered today shows  atrial pacing with intrinsic ventricular conduction   Recent Labs: 03/03/2017: ALT 14 03/24/2017: BUN 14; Creatinine, Ser 1.06; Hemoglobin 14.3; Platelets 214; Potassium 3.9; Sodium 141   Wt Readings from Last 3 Encounters:  03/24/17 245 lb (111.1 kg)  03/17/17 248 lb (112.5 kg)  03/07/17 246 lb 11.1 oz (111.9 kg)     Other studies Reviewed: Additional studies/ records that were reviewed today include: Dr Tanna Furry office notes, hospital records   Assessment and Plan:  1.  Symptomatic bradycardia Normal PPM function See Pace Art report No changes today   Current medicines are reviewed at length with the patient today.  The patient does not have concerns regarding his medicines.  The following changes were made today:  none  Labs/ tests ordered today include: none No orders of the defined types were placed in this encounter.    Disposition:   Follow up with Delilah Shan, Dr Lovena Le 1 year    Signed, Chanetta Marshall, NP 05/03/2017 3:24 PM  [facility] [place] [address] [place] [address] [phone] (office) [phone] (fax)

## 2017-05-04 ENCOUNTER — Encounter: Payer: Self-pay | Admitting: Nurse Practitioner

## 2017-05-04 ENCOUNTER — Ambulatory Visit: Payer: Medicare HMO | Admitting: Nurse Practitioner

## 2017-05-04 VITALS — BP 118/80 | HR 68 | Ht 79.0 in | Wt 252.4 lb

## 2017-05-04 DIAGNOSIS — R001 Bradycardia, unspecified: Secondary | ICD-10-CM

## 2017-05-04 NOTE — Patient Instructions (Addendum)
Medication Instructions:   Your physician recommends that you continue on your current medications as directed. Please refer to the Current Medication list given to you today.   If you need a refill on your cardiac medications before your next appointment, please call your pharmacy.  Labwork: NONE ORDERED  TODAY    Testing/Procedures: NONE ORDERED  TODAY    Follow-Up:  Your physician wants you to follow-up in: [place] will receive a reminder letter in the mail two months in advance. If you don't receive a letter, please call our office to schedule the follow-up appointment.  Remote monitoring is used to monitor your Pacemaker of ICD from home. This monitoring reduces the number of office visits required to check your device to one time per year. It allows [place] to keep an eye on the functioning of your device to ensure it is working properly. You are scheduled for a device check from home on . 07-03-17 .You may send your transmission at any time that day. If you have a wireless device, the transmission will be sent automatically. After your physician reviews your transmission, you will receive a postcard with your next transmission date.    Any Other Special Instructions Will Be Listed Below (If Applicable).

## 2017-05-08 ENCOUNTER — Ambulatory Visit: Payer: Medicare HMO | Admitting: Physical Therapy

## 2017-05-08 LAB — CUP PACEART INCLINIC DEVICE CHECK
Implantable Lead Implant Date: 20171016
Implantable Lead Location: 753859
Implantable Pulse Generator Implant Date: 20171016
MDC IDC LEAD IMPLANT DT: 20171016
MDC IDC LEAD LOCATION: 753860
MDC IDC SESS DTM: 20190325102418
Pulse Gen Model: 2272
Pulse Gen Serial Number: 7961269

## 2017-05-15 ENCOUNTER — Other Ambulatory Visit: Payer: Self-pay

## 2017-05-15 ENCOUNTER — Ambulatory Visit: Payer: Medicare HMO | Admitting: Neurology

## 2017-05-15 ENCOUNTER — Encounter: Payer: Self-pay | Admitting: Neurology

## 2017-05-15 VITALS — BP 126/78 | HR 66 | Ht 78.0 in | Wt 251.0 lb

## 2017-05-15 DIAGNOSIS — G40119 Localization-related (focal) (partial) symptomatic epilepsy and epileptic syndromes with simple partial seizures, intractable, without status epilepticus: Secondary | ICD-10-CM

## 2017-05-15 DIAGNOSIS — Z8679 Personal history of other diseases of the circulatory system: Secondary | ICD-10-CM | POA: Diagnosis not present

## 2017-05-15 MED ORDER — PHENYTOIN SODIUM EXTENDED 100 MG PO CAPS: ORAL_CAPSULE | ORAL | 5 refills | Status: DC

## 2017-05-15 MED ORDER — OXCARBAZEPINE 600 MG PO TABS: ORAL_TABLET | ORAL | 6 refills | Status: DC

## 2017-05-15 NOTE — Patient Instructions (Signed)
1. Increase Trileptal (oxcarbazepine) 300mg : Take 2 tabs in AM (600mg ), 3 tabs in PM (900mg ) for a week, then increase to 3 tabs (900mg ) in AM, 3 tabs in PM (900mg ). Your new bottle will be for Trileptal (oxcarbazepine) 600mg : Take 1.5 tabs (900mg ) twice a day  2. A week after increasing Trileptal to 900mg  twice a day, start weaning down on Dilantin 100mg : Take 1 cap daily for 2 weeks, then 1 cap every other day for 2 weeks, then stop  3. Follow-up in 3 months, call for any changes  Seizure Precautions: 1. If medication has been prescribed for you to prevent seizures, take it exactly as directed.  Do not stop taking the medicine without talking to your doctor first, even if you have not had a seizure in a long time.   2. Avoid activities in which a seizure would cause danger to yourself or to others.  Don't operate dangerous machinery, swim alone, or climb in high or dangerous places, such as on ladders, roofs, or girders.  Do not drive unless your doctor says you may.  3. If you have any warning that you may have a seizure, lay down in a safe place where you can't hurt yourself.    4.  No driving for 6 months from last seizure, as per [facility]   Please refer to the following link on the [facility] website for more information: http://www.epilepsyfoundation.org/answerplace/Social/driving/drivingu.cfm   5.  Maintain good sleep hygiene. Avoid alcohol.  6.  Contact your doctor if you have any problems that may be related to the medicine you are taking.  7.  Call 911 and bring the patient back to the ED if:        A.  The seizure lasts longer than 5 minutes.       B.  The patient doesn't awaken shortly after the seizure  C.  The patient has new problems such as difficulty seeing, speaking or moving  D.  The patient was injured during the seizure  E.  The patient has a temperature over 102 F (39C)  F.  The patient vomited and now is having trouble  breathing
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
## 2017-05-16 ENCOUNTER — Telehealth: Payer: Self-pay | Admitting: Neurology

## 2017-05-16 MED ORDER — OXCARBAZEPINE 600 MG PO TABS: ORAL_TABLET | ORAL | 6 refills | Status: DC

## 2017-05-16 NOTE — Telephone Encounter (Signed)
Spoke with pharmacy.  They state that they never received the e-Rx from yesterday.  Attempted to place verbal Rx, but was placed on hold and phone disconnected after a few minutes. Sent Rx via Epic.

## 2017-05-16 NOTE — Telephone Encounter (Signed)
If patient is at the pharmacy, call can be transferred to refill team.  1.     Which medications need to be refilled? Trileptal  2.     Which pharmacy/location (including street and city if local pharmacy) is medication to be sent to?  [facility] on [address]  3.     Do they need a 30 or 90 day supply? 600 MG INSTEAD OF 300 mg

## 2017-05-31 DIAGNOSIS — M79604 Pain in right leg: Secondary | ICD-10-CM | POA: Diagnosis not present

## 2017-06-07 DIAGNOSIS — M79604 Pain in right leg: Secondary | ICD-10-CM | POA: Diagnosis not present

## 2017-06-14 DIAGNOSIS — D485 Neoplasm of uncertain behavior of skin: Secondary | ICD-10-CM | POA: Diagnosis not present

## 2017-06-14 DIAGNOSIS — Z85828 Personal history of other malignant neoplasm of skin: Secondary | ICD-10-CM | POA: Diagnosis not present

## 2017-06-14 DIAGNOSIS — C44219 Basal cell carcinoma of skin of left ear and external auricular canal: Secondary | ICD-10-CM | POA: Diagnosis not present

## 2017-06-14 DIAGNOSIS — C44319 Basal cell carcinoma of skin of other parts of face: Secondary | ICD-10-CM | POA: Diagnosis not present

## 2017-06-14 DIAGNOSIS — D2271 Melanocytic nevi of right lower limb, including hip: Secondary | ICD-10-CM | POA: Diagnosis not present

## 2017-06-14 DIAGNOSIS — L821 Other seborrheic keratosis: Secondary | ICD-10-CM | POA: Diagnosis not present

## 2017-06-14 DIAGNOSIS — D1801 Hemangioma of skin and subcutaneous tissue: Secondary | ICD-10-CM | POA: Diagnosis not present

## 2017-06-14 DIAGNOSIS — L820 Inflamed seborrheic keratosis: Secondary | ICD-10-CM | POA: Diagnosis not present

## 2017-06-23 DIAGNOSIS — M25561 Pain in right knee: Secondary | ICD-10-CM | POA: Diagnosis not present

## 2017-07-03 ENCOUNTER — Telehealth: Payer: Self-pay | Admitting: Cardiology

## 2017-07-03 ENCOUNTER — Encounter: Payer: Medicare HMO | Admitting: *Deleted

## 2017-07-03 NOTE — Telephone Encounter (Signed)
Spoke with pt and reminded pt of remote transmission that is due today. Pt verbalized understanding.   

## 2017-07-06 ENCOUNTER — Encounter: Payer: Self-pay | Admitting: Cardiology

## 2017-07-11 ENCOUNTER — Ambulatory Visit (INDEPENDENT_AMBULATORY_CARE_PROVIDER_SITE_OTHER): Payer: Medicare HMO | Admitting: *Deleted

## 2017-07-11 DIAGNOSIS — R001 Bradycardia, unspecified: Secondary | ICD-10-CM

## 2017-07-11 DIAGNOSIS — Z85828 Personal history of other malignant neoplasm of skin: Secondary | ICD-10-CM | POA: Diagnosis not present

## 2017-07-11 DIAGNOSIS — C44311 Basal cell carcinoma of skin of nose: Secondary | ICD-10-CM | POA: Diagnosis not present

## 2017-07-11 DIAGNOSIS — C44219 Basal cell carcinoma of skin of left ear and external auricular canal: Secondary | ICD-10-CM | POA: Diagnosis not present

## 2017-07-11 NOTE — Progress Notes (Signed)
Remote pacemaker transmission.   

## 2017-07-14 ENCOUNTER — Encounter: Payer: Self-pay | Admitting: Cardiology

## 2017-07-27 LAB — CUP PACEART REMOTE DEVICE CHECK
Implantable Lead Implant Date: 20171016
Implantable Lead Location: 753860
Implantable Pulse Generator Implant Date: 20171016
MDC IDC LEAD IMPLANT DT: 20171016
MDC IDC LEAD LOCATION: 753859
MDC IDC SESS DTM: 20190613105800
Pulse Gen Serial Number: 7961269

## 2017-08-07 DIAGNOSIS — R569 Unspecified convulsions: Secondary | ICD-10-CM | POA: Diagnosis not present

## 2017-08-07 DIAGNOSIS — Z8679 Personal history of other diseases of the circulatory system: Secondary | ICD-10-CM | POA: Diagnosis not present

## 2017-08-07 DIAGNOSIS — Z0000 Encounter for general adult medical examination without abnormal findings: Secondary | ICD-10-CM | POA: Diagnosis not present

## 2017-08-07 DIAGNOSIS — R972 Elevated prostate specific antigen [PSA]: Secondary | ICD-10-CM | POA: Diagnosis not present

## 2017-08-07 DIAGNOSIS — Z1389 Encounter for screening for other disorder: Secondary | ICD-10-CM | POA: Diagnosis not present

## 2017-08-07 DIAGNOSIS — Z125 Encounter for screening for malignant neoplasm of prostate: Secondary | ICD-10-CM | POA: Diagnosis not present

## 2017-08-16 ENCOUNTER — Encounter: Payer: Self-pay | Admitting: Neurology

## 2017-08-16 ENCOUNTER — Other Ambulatory Visit: Payer: Self-pay

## 2017-08-16 ENCOUNTER — Ambulatory Visit: Payer: Medicare HMO | Admitting: Neurology

## 2017-08-16 VITALS — BP 120/74 | HR 67 | Ht 79.0 in | Wt 244.0 lb

## 2017-08-16 DIAGNOSIS — Z8679 Personal history of other diseases of the circulatory system: Secondary | ICD-10-CM | POA: Diagnosis not present

## 2017-08-16 DIAGNOSIS — G40119 Localization-related (focal) (partial) symptomatic epilepsy and epileptic syndromes with simple partial seizures, intractable, without status epilepticus: Secondary | ICD-10-CM | POA: Diagnosis not present

## 2017-08-16 MED ORDER — OXCARBAZEPINE 600 MG PO TABS: ORAL_TABLET | ORAL | 3 refills | Status: DC
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
## 2017-08-28 ENCOUNTER — Encounter: Payer: Self-pay | Admitting: Neurology

## 2017-08-29 ENCOUNTER — Encounter: Payer: Self-pay | Admitting: Neurology

## 2017-10-10 ENCOUNTER — Ambulatory Visit (INDEPENDENT_AMBULATORY_CARE_PROVIDER_SITE_OTHER): Payer: Medicare HMO | Admitting: *Deleted

## 2017-10-10 ENCOUNTER — Telehealth: Payer: Self-pay | Admitting: Cardiology

## 2017-10-10 DIAGNOSIS — R001 Bradycardia, unspecified: Secondary | ICD-10-CM | POA: Diagnosis not present

## 2017-10-10 NOTE — Telephone Encounter (Signed)
LMOVM reminding pt to send remote transmission.   

## 2017-10-11 NOTE — Progress Notes (Signed)
Remote pacemaker transmission.   

## 2017-10-12 ENCOUNTER — Encounter: Payer: Self-pay | Admitting: Cardiology

## 2017-11-04 DIAGNOSIS — R69 Illness, unspecified: Secondary | ICD-10-CM | POA: Diagnosis not present

## 2017-11-09 DIAGNOSIS — R972 Elevated prostate specific antigen [PSA]: Secondary | ICD-10-CM | POA: Diagnosis not present

## 2017-11-13 LAB — CUP PACEART REMOTE DEVICE CHECK
Battery Remaining Percentage: 95.5 %
Battery Voltage: 2.99 V
Brady Statistic AS VS Percent: 41 %
Brady Statistic RA Percent Paced: 58 %
Brady Statistic RV Percent Paced: 11 %
Date Time Interrogation Session: 20190828063037
Implantable Lead Implant Date: 20171016
Implantable Lead Location: 753859
Implantable Pulse Generator Implant Date: 20171016
Lead Channel Pacing Threshold Amplitude: 0.5 V
Lead Channel Pacing Threshold Pulse Width: 0.5 ms
Lead Channel Pacing Threshold Pulse Width: 0.5 ms
Lead Channel Sensing Intrinsic Amplitude: 5 mV
Lead Channel Setting Sensing Sensitivity: 2 mV
MDC IDC LEAD IMPLANT DT: 20171016
MDC IDC LEAD LOCATION: 753860
MDC IDC MSMT BATTERY REMAINING LONGEVITY: 114 mo
MDC IDC MSMT LEADCHNL RA IMPEDANCE VALUE: 510 Ohm
MDC IDC MSMT LEADCHNL RV IMPEDANCE VALUE: 460 Ohm
MDC IDC MSMT LEADCHNL RV PACING THRESHOLD AMPLITUDE: 1.25 V
MDC IDC MSMT LEADCHNL RV SENSING INTR AMPL: 12 mV
MDC IDC SET LEADCHNL RA PACING AMPLITUDE: 2 V
MDC IDC SET LEADCHNL RV PACING AMPLITUDE: 2.5 V
MDC IDC SET LEADCHNL RV PACING PULSEWIDTH: 0.5 ms
MDC IDC STAT BRADY AP VP PERCENT: 11 %
MDC IDC STAT BRADY AP VS PERCENT: 47 %
MDC IDC STAT BRADY AS VP PERCENT: 1 %
Pulse Gen Model: 2272
Pulse Gen Serial Number: 7961269

## 2018-01-01 DIAGNOSIS — N5201 Erectile dysfunction due to arterial insufficiency: Secondary | ICD-10-CM | POA: Diagnosis not present

## 2018-01-01 DIAGNOSIS — R972 Elevated prostate specific antigen [PSA]: Secondary | ICD-10-CM | POA: Diagnosis not present

## 2018-01-09 ENCOUNTER — Ambulatory Visit (INDEPENDENT_AMBULATORY_CARE_PROVIDER_SITE_OTHER): Payer: Medicare HMO

## 2018-01-09 DIAGNOSIS — R001 Bradycardia, unspecified: Secondary | ICD-10-CM | POA: Diagnosis not present

## 2018-01-09 DIAGNOSIS — I495 Sick sinus syndrome: Secondary | ICD-10-CM

## 2018-01-09 NOTE — Progress Notes (Signed)
Remote pacemaker transmission.   

## 2018-01-10 DIAGNOSIS — Z950 Presence of cardiac pacemaker: Secondary | ICD-10-CM | POA: Diagnosis not present

## 2018-01-10 DIAGNOSIS — Z885 Allergy status to narcotic agent status: Secondary | ICD-10-CM | POA: Diagnosis not present

## 2018-01-10 DIAGNOSIS — G40909 Epilepsy, unspecified, not intractable, without status epilepticus: Secondary | ICD-10-CM | POA: Diagnosis not present

## 2018-01-10 DIAGNOSIS — Z886 Allergy status to analgesic agent status: Secondary | ICD-10-CM | POA: Diagnosis not present

## 2018-01-24 ENCOUNTER — Other Ambulatory Visit: Payer: Self-pay | Admitting: Neurology

## 2018-02-02 ENCOUNTER — Other Ambulatory Visit: Payer: Self-pay

## 2018-02-02 MED ORDER — OXCARBAZEPINE 600 MG PO TABS: ORAL_TABLET | ORAL | 1 refills | Status: DC

## 2018-02-23 DIAGNOSIS — R972 Elevated prostate specific antigen [PSA]: Secondary | ICD-10-CM | POA: Diagnosis not present

## 2018-03-02 LAB — CUP PACEART REMOTE DEVICE CHECK
Battery Remaining Percentage: 95.5 %
Brady Statistic AP VP Percent: 11 %
Brady Statistic AP VS Percent: 47 %
Brady Statistic AS VP Percent: 1 %
Brady Statistic RV Percent Paced: 11 %
Date Time Interrogation Session: 20191126071145
Implantable Lead Location: 753859
Implantable Pulse Generator Implant Date: 20171016
Lead Channel Pacing Threshold Amplitude: 1.25 V
Lead Channel Sensing Intrinsic Amplitude: 12 mV
Lead Channel Setting Pacing Amplitude: 2 V
Lead Channel Setting Pacing Pulse Width: 0.5 ms
Lead Channel Setting Sensing Sensitivity: 2 mV
MDC IDC LEAD IMPLANT DT: 20171016
MDC IDC LEAD IMPLANT DT: 20171016
MDC IDC LEAD LOCATION: 753860
MDC IDC MSMT BATTERY REMAINING LONGEVITY: 115 mo
MDC IDC MSMT BATTERY VOLTAGE: 2.99 V
MDC IDC MSMT LEADCHNL RA IMPEDANCE VALUE: 540 Ohm
MDC IDC MSMT LEADCHNL RA PACING THRESHOLD AMPLITUDE: 0.5 V
MDC IDC MSMT LEADCHNL RA PACING THRESHOLD PULSEWIDTH: 0.5 ms
MDC IDC MSMT LEADCHNL RA SENSING INTR AMPL: 5 mV
MDC IDC MSMT LEADCHNL RV IMPEDANCE VALUE: 460 Ohm
MDC IDC MSMT LEADCHNL RV PACING THRESHOLD PULSEWIDTH: 0.5 ms
MDC IDC SET LEADCHNL RV PACING AMPLITUDE: 2.5 V
MDC IDC STAT BRADY AS VS PERCENT: 42 %
MDC IDC STAT BRADY RA PERCENT PACED: 57 %
Pulse Gen Model: 2272
Pulse Gen Serial Number: 7961269

## 2018-03-21 ENCOUNTER — Ambulatory Visit: Payer: Medicare HMO | Admitting: Neurology

## 2018-03-21 ENCOUNTER — Encounter: Payer: Self-pay | Admitting: Neurology

## 2018-03-21 ENCOUNTER — Other Ambulatory Visit: Payer: Self-pay

## 2018-03-21 VITALS — BP 130/88 | HR 78 | Ht 79.0 in | Wt 257.0 lb

## 2018-03-21 DIAGNOSIS — Z8679 Personal history of other diseases of the circulatory system: Secondary | ICD-10-CM | POA: Diagnosis not present

## 2018-03-21 DIAGNOSIS — G40119 Localization-related (focal) (partial) symptomatic epilepsy and epileptic syndromes with simple partial seizures, intractable, without status epilepticus: Secondary | ICD-10-CM | POA: Diagnosis not present

## 2018-03-21 MED ORDER — OXCARBAZEPINE 600 MG PO TABS: ORAL_TABLET | ORAL | 3 refills | Status: DC

## 2018-03-21 NOTE — Patient Instructions (Signed)
Great seeing you! Continue oxcarbazepine 600mg : take 1.5 tablets twice a day. See you in a year. At that time, we will talk about medication decision for the future. Call for any changes. Have a great year!  Seizure Precautions: 1. If medication has been prescribed for you to prevent seizures, take it exactly as directed.  Do not stop taking the medicine without talking to your doctor first, even if you have not had a seizure in a long time.   2. Avoid activities in which a seizure would cause danger to yourself or to others.  Don't operate dangerous machinery, swim alone, or climb in high or dangerous places, such as on ladders, roofs, or girders.  Do not drive unless your doctor says you may.  3. If you have any warning that you may have a seizure, lay down in a safe place where you can't hurt yourself.    4.  No driving for 6 months from last seizure, as per [facility].   Please refer to the following link on the [place] website for more information: [email or web address]   5.  Maintain good sleep hygiene. Avoid alcohol.  6.  Contact your doctor if you have any problems that may be related to the medicine you are taking.  7.  Call 911 and bring the patient back to the ED if:        A.  The seizure lasts longer than 5 minutes.       B.  The patient doesn't awaken shortly after the seizure  C.  The patient has new problems such as difficulty seeing, speaking or moving  D.  The patient was injured during the seizure  E.  The patient has a temperature over 102 F (39C)  F.  The patient vomited and now is having trouble breathing
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
## 2018-03-26 ENCOUNTER — Ambulatory Visit: Payer: Medicare HMO | Admitting: Neurology

## 2018-04-10 ENCOUNTER — Ambulatory Visit (INDEPENDENT_AMBULATORY_CARE_PROVIDER_SITE_OTHER): Payer: Medicare HMO | Admitting: *Deleted

## 2018-04-10 DIAGNOSIS — R001 Bradycardia, unspecified: Secondary | ICD-10-CM

## 2018-04-10 DIAGNOSIS — I495 Sick sinus syndrome: Secondary | ICD-10-CM

## 2018-04-11 DIAGNOSIS — C61 Malignant neoplasm of prostate: Secondary | ICD-10-CM | POA: Diagnosis not present

## 2018-04-11 LAB — CUP PACEART REMOTE DEVICE CHECK
Battery Remaining Percentage: 95.5 %
Battery Voltage: 2.99 V
Brady Statistic AP VP Percent: 13 %
Brady Statistic AS VS Percent: 41 %
Brady Statistic RA Percent Paced: 57 %
Date Time Interrogation Session: 20200225070031
Implantable Lead Location: 753859
Implantable Pulse Generator Implant Date: 20171016
Lead Channel Impedance Value: 490 Ohm
Lead Channel Pacing Threshold Pulse Width: 0.5 ms
Lead Channel Setting Pacing Amplitude: 2 V
Lead Channel Setting Pacing Amplitude: 2.5 V
Lead Channel Setting Pacing Pulse Width: 0.5 ms
Lead Channel Setting Sensing Sensitivity: 2 mV
MDC IDC LEAD IMPLANT DT: 20171016
MDC IDC LEAD IMPLANT DT: 20171016
MDC IDC LEAD LOCATION: 753860
MDC IDC MSMT BATTERY REMAINING LONGEVITY: 115 mo
MDC IDC MSMT LEADCHNL RA IMPEDANCE VALUE: 510 Ohm
MDC IDC MSMT LEADCHNL RA PACING THRESHOLD AMPLITUDE: 0.5 V
MDC IDC MSMT LEADCHNL RA PACING THRESHOLD PULSEWIDTH: 0.5 ms
MDC IDC MSMT LEADCHNL RA SENSING INTR AMPL: 5 mV
MDC IDC MSMT LEADCHNL RV PACING THRESHOLD AMPLITUDE: 1.25 V
MDC IDC MSMT LEADCHNL RV SENSING INTR AMPL: 10.8 mV
MDC IDC PG SERIAL: 7961269
MDC IDC STAT BRADY AP VS PERCENT: 46 %
MDC IDC STAT BRADY AS VP PERCENT: 1 %
MDC IDC STAT BRADY RV PERCENT PACED: 13 %
Pulse Gen Model: 2272

## 2018-04-17 ENCOUNTER — Encounter: Payer: Self-pay | Admitting: Cardiology

## 2018-04-17 NOTE — Progress Notes (Signed)
Remote pacemaker transmission.   

## 2018-04-24 ENCOUNTER — Ambulatory Visit: Payer: Medicare HMO

## 2018-04-24 ENCOUNTER — Ambulatory Visit: Payer: Medicare HMO | Admitting: Radiation Oncology

## 2018-04-30 DIAGNOSIS — C61 Malignant neoplasm of prostate: Secondary | ICD-10-CM | POA: Diagnosis not present

## 2018-04-30 DIAGNOSIS — M6281 Muscle weakness (generalized): Secondary | ICD-10-CM | POA: Diagnosis not present

## 2018-05-01 ENCOUNTER — Other Ambulatory Visit: Payer: Self-pay | Admitting: Urology

## 2018-05-01 DIAGNOSIS — C61 Malignant neoplasm of prostate: Secondary | ICD-10-CM | POA: Diagnosis not present

## 2018-05-02 DIAGNOSIS — M62838 Other muscle spasm: Secondary | ICD-10-CM | POA: Diagnosis not present

## 2018-05-02 DIAGNOSIS — N393 Stress incontinence (female) (male): Secondary | ICD-10-CM | POA: Diagnosis not present

## 2018-05-02 DIAGNOSIS — M6281 Muscle weakness (generalized): Secondary | ICD-10-CM | POA: Diagnosis not present

## 2018-05-14 ENCOUNTER — Telehealth: Payer: Self-pay | Admitting: Nurse Practitioner

## 2018-05-14 NOTE — Telephone Encounter (Signed)
Anthony Banks - I see you were going to arrange a virtual visit ?  surgery is for 05/31/18 just let me know. Thanks.

## 2018-05-14 NOTE — Telephone Encounter (Signed)
° °  [facility] Pre-operative Risk Assessment    Request for surgical clearance:  1. What type of surgery is being performed? Robot Assisted Laparoscopic Radical Prostatetomy with Bilateral Pelvic Lymphaderectomy  2. When is this surgery scheduled? 05-31-18   3. What type of clearance is required (medical clearance vs. Pharmacy clearance to hold med vs. Both)?  Medical Clearance 4.  5. there any medications that need to be held prior to surgery and how long? Not sure  6. Practice name and name of physician performing surgery? Dr Raynelle Bring   7. What is your office phone number [phone]   7.   What is your office fax number [phone]  8.   Anesthesia type (None, local, MAC, general) ? General  Anthony Banks 05/14/2018, 11:28 AM  _________________________________________________________________   (provider comments below) - --

## 2018-05-16 ENCOUNTER — Telehealth: Payer: Self-pay | Admitting: Internal Medicine

## 2018-05-16 NOTE — Telephone Encounter (Signed)
He is low risk for surgery. GT

## 2018-05-16 NOTE — Telephone Encounter (Signed)
Dr. Lovena Le you are to see Mr. Anthony Banks telehealth visit 05/17/18.  If you agree he may have robot assisted laparoscopic radical prostatectomy with bilateral pelvis lymphaderectomy please send to pre- op pool or a note stating that to Dr. Alinda Money.  But pre-op will be glad to send if you just let us know.  Thanks.

## 2018-05-16 NOTE — Telephone Encounter (Signed)
New message    Patient walked through webex trail. Able to see and hear patient. evisitinfo dot phrase sent through mychart to patient.

## 2018-05-17 ENCOUNTER — Telehealth (INDEPENDENT_AMBULATORY_CARE_PROVIDER_SITE_OTHER): Payer: Medicare HMO | Admitting: Internal Medicine

## 2018-05-17 ENCOUNTER — Other Ambulatory Visit: Payer: Self-pay

## 2018-05-17 DIAGNOSIS — Z01818 Encounter for other preprocedural examination: Secondary | ICD-10-CM

## 2018-05-17 DIAGNOSIS — Z950 Presence of cardiac pacemaker: Secondary | ICD-10-CM

## 2018-05-17 DIAGNOSIS — I495 Sick sinus syndrome: Secondary | ICD-10-CM

## 2018-05-17 NOTE — Progress Notes (Signed)
Electrophysiology TeleHealth Note   Due to national recommendations of social distancing due to COVID 19, an audio/video telehealth visit is felt to be most appropriate for this patient at this time.  See MyChart message from today for the patient's consent to telehealth for [facility].   Date:  05/17/2018   ID:  Anthony, Banks [birth year], MRN [id number]  Location: patient's home  Provider location: [address] [place]  Evaluation Performed: Follow-up visit  PCP:  Lavone Orn, MD  Cardiologist:  Cristopher Peru, MD  Electrophysiologist:  Dr Lovena Le  Chief Complaint:  Preoperative evaluation  History of Present Illness:    Anthony Banks is a 68 y.o. male who presents via audio/video conferencing for a telehealth visit today.  He is a pleasant 68 yo man with severe sinus node dysfunction and long pauses who underwent PPM insertion 3 years ago. He had a SDH just over a year ago with associated seizures. he has been diagnosed with prostate CA and is pending prostatectomy in a couple of weeks.  Since last being seen in our clinic, the patient reports doing well despite his prior and recurrent SDH's.  Today, he denies symptoms of palpitations, chest pain, shortness of breath,  lower extremity edema, dizziness, presyncope, or syncope.  The patient is otherwise without complaint today.  The patient denies symptoms of fevers, chills, cough, or new SOB worrisome for COVID 19.  Past Medical History:  Diagnosis Date  . Borderline hyperlipidemia   . Cancer ([place])    basal face Dr [name]  . Lumbar degenerative disc disease   . Nummular eczema   . Overweight   . Subdural hematoma ([place])   . Syncope     Past Surgical History:  Procedure Laterality Date  . basal/squamous cell face    . bilateral blepharoplasy    . COLONOSCOPY     polyps remove  . COLONOSCOPY WITH PROPOFOL N/A 11/24/2014   Procedure: COLONOSCOPY WITH PROPOFOL;  Surgeon: Garlan Fair, MD;   Location: [facility];  Service: Endoscopy;  Laterality: N/A;  . CRANIOTOMY N/A 01/30/2017   Procedure: CRANIOTOMY HEMATOMA EVACUATION SUBDURAL;  Surgeon: Consuella Lose, MD;  Location: [place];  Service: Neurosurgery;  Laterality: N/A;  CRANIOTOMY HEMATOMA EVACUATION SUBDURAL  . CRANIOTOMY Left 03/07/2017   Procedure: CRANIOTOMY HEMATOMA EVACUATION SUBDURAL;  Surgeon: Consuella Lose, MD;  Location: [place];  Service: Neurosurgery;  Laterality: Left;  . EP IMPLANTABLE DEVICE N/A 11/30/2015   Procedure: Pacemaker Implant;  Surgeon: Evans Lance, MD;  Location: [place] [facility];  Service: Cardiovascular;  Laterality: N/A;  . L3 Gill procedure with L3 PLIF with interbody implant L3-4 posterlateral asthrodesis and posterior instrumentation of bone graft    . nasal polyps    . NASAL SEPTUM SURGERY    . VASECTOMY      Current Outpatient Medications  Medication Sig Dispense Refill  . acetaminophen (TYLENOL) 325 MG tablet Take 325-650 mg by mouth every 6 (six) hours as needed for moderate pain.     Marland Kitchen glucosamine-chondroitin 500-400 MG tablet Take 2 tablets by mouth daily.    Marland Kitchen oxcarbazepine (TRILEPTAL) 600 MG tablet Take 1.5 tablets twice a day (Patient taking differently: Take 600 mg by mouth 2 (two) times daily. ) 270 tablet 3   No current facility-administered medications for this visit.     Allergies:   Aspirin; Morphine sulfate; and Nsaids   Social History:  The patient  reports that he has never smoked.  He has never used smokeless tobacco. He reports current alcohol use of about 1.0 - 2.0 standard drinks of alcohol per week. He reports that he does not use drugs.   Family History:  The patient's  family history includes Asthma in his mother and sister; Cancer in his sister.   ROS:  Please see the history of present illness.   All other systems are personally reviewed and negative.    Exam:    Vital Signs:  There were no vitals taken for this visit.  Well appearing, alert and  conversant, regular work of breathing,  good skin color Eyes- anicteric, neuro- grossly intact, skin- no apparent rash or lesions or cyanosis, mouth- oral mucosa is pink   Labs/Other Tests and Data Reviewed:    Recent Labs: No results found for requested labs within last 8760 hours.   Wt Readings from Last 3 Encounters:  03/21/18 257 lb (116.6 kg)  08/16/17 244 lb (110.7 kg)  05/15/17 251 lb (113.9 kg)     Other studies personally reviewed: Last device remote is reviewed from [facility] PDF dated 04/10/18 which reveals normal device function, no arrhythmias normal St. Jude DDD function   ASSESSMENT & PLAN:    1.  Preoperative evaluation - The patient is pending radical prostatectomy. He is low risk for major cardiac complications. He may proceed with surgery. 2. Sinus node dysfunction - he is asymptomatic s/p PPM insertion.  3. Subdural hematoma/seizures - he has been asymptomatic. Hopefully this will continue 4. COVID 19 screen The patient denies symptoms of COVID 19 at this time.  The importance of social distancing was discussed today.  Follow-up:  One year with me Next remote: 07/10/18  Current medicines are reviewed at length with the patient today.   The patient does not have concerns regarding his medicines.  The following changes were made today:  none  Labs/ tests ordered today include:  No orders of the defined types were placed in this encounter.    Patient Risk:  after full review of this patients clinical status, I feel that they are at moderate risk at this time.  Today, I have spent 15 minutes with the patient with telehealth technology discussing his preoperative status and PPM/sinus node dysfunction .    Signed, Cristopher Peru, MD  05/17/2018 8:31 AM     [facility] [address] [place] [phone] (office) [phone] (fax)

## 2018-05-17 NOTE — Telephone Encounter (Signed)
   Primary Cardiologist: Cristopher Peru, MD  Chart reviewed as part of pre-operative protocol coverage.  HALEN ANTENUCCI was last seen on 05/17/18 by Dr. Lovena Le.  Per Dr. Lovena Le, the patient is acceptable risk for planned procedure without further cardiovascular testing.   I will route this recommendation to the requesting party via Epic fax function and remove from pre-op pool.  Please call with questions.  Tami Lin Lacole Komorowski, PA 05/17/2018, 8:15 AM

## 2018-05-21 NOTE — Progress Notes (Signed)
Spoke with Beola Cord for her to ask dr borden if st jude rep needed per his instructions in special needs column. Pacer orders received from dr taylor cardiology do not state rep is needed. Copy of pacer odrers faced to connie mabe to show dr borden and she will call me back and let me know if st jude rep needed day of surgery.

## 2018-05-21 NOTE — Progress Notes (Signed)
Cardiac clearance note 05-17-18 dr Lovena Le epic Pacer check 04-10-18 epic

## 2018-05-21 NOTE — Patient Instructions (Addendum)
Anthony Banks   Your procedure is scheduled on: 05-31-2018  Report to Anthony Banks Main  Entrance  Report to admitting at 930 AM    Call this number if you have problems the morning of surgery [phone]    Remember: Do not eat food  :After Midnight.tuesday night, clear liquids all day 05-30-2018.  BRUSH YOUR TEETH MORNING OF SURGERY AND RINSE YOUR MOUTH OUT, NO CHEWING GUM CANDY OR MINTS.  Follow all bowel prep instructions from Anthony Banks      CLEAR LIQUID DIET   Foods Allowed                                                                     Foods Excluded  Coffee and tea, regular and decaf                             liquids that you cannot  Plain Jell-O in any flavor                                             see through such as: Fruit ices (not with fruit pulp)                                     milk, soups, orange juice  Iced Popsicles                                    All solid food Carbonated beverages, regular and diet                                    Cranberry, grape and apple juices Sports drinks like Gatorade Lightly seasoned clear broth or consume(fat free) Sugar, honey syrup  Sample Menu Breakfast                                Lunch                                     Supper Cranberry juice                    Beef broth                            Chicken broth Jell-O                                     Grape juice  Apple juice Coffee or tea                        Jell-O                                      Popsicle                                                Coffee or tea                        Coffee or tea  _____________________________________________________________________    Take these medicines the morning of surgery with A SIP OF WATER: oxcarbazepine (trileptal)              You may not have any metal on your body including hair pins and              piercings  Do not wear jewelry, powders or  perfumes, deodorant                         Men may shave face and neck.   Do not bring valuables to the Banks. Anthony Banks.  Contacts, dentures or bridgework may not be worn into surgery.  Leave suitcase in the car. After surgery it may be brought to your room.   _____________________________________________________________________             Anthony Banks - Preparing for Surgery Before surgery, you can play an important role.  Because skin is not sterile, your skin needs to be as free of germs as possible.  You can reduce the number of germs on your skin by washing with CHG (chlorahexidine gluconate) soap before surgery.  CHG is an antiseptic cleaner which kills germs and bonds with the skin to continue killing germs even after washing. Please DO NOT use if you have an allergy to CHG or antibacterial soaps.  If your skin becomes reddened/irritated stop using the CHG and inform your nurse when you arrive at Short Stay. Do not shave (including legs and underarms) for at least 48 hours prior to the first CHG shower.  You may shave your face/neck. Please follow these instructions carefully:  1.  Shower with CHG Soap the night before surgery and the  morning of Surgery.  2.  If you choose to wash your hair, wash your hair first as usual with your  normal  shampoo.  3.  After you shampoo, rinse your hair and body thoroughly to remove the  shampoo.                           4.  Use CHG as you would any other liquid soap.  You can apply chg directly  to the skin and wash                       Gently with a scrungie or clean washcloth.  5.  Apply the CHG Soap to your body ONLY FROM THE NECK DOWN.   Do  not use on face/ open                           Wound or open sores. Avoid contact with eyes, ears mouth and genitals (private parts).                       Wash face,  Genitals (private parts) with your normal soap.             6.  Wash thoroughly,  paying special attention to the area where your surgery  will be performed.  7.  Thoroughly rinse your body with warm water from the neck down.  8.  DO NOT shower/wash with your normal soap after using and rinsing off  the CHG Soap.                9.  Pat yourself dry with a clean towel.            10.  Wear clean pajamas.            11.  Place clean sheets on your bed the night of your first shower and do not  sleep with pets. Day of Surgery : Do not apply any lotions/deodorants the morning of surgery.  Please wear clean clothes to the Banks/surgery center.  FAILURE TO FOLLOW THESE INSTRUCTIONS MAY RESULT IN THE CANCELLATION OF YOUR SURGERY PATIENT SIGNATURE_________________________________  NURSE SIGNATURE__________________________________  ________________________________________________________________________   Anthony Banks  An incentive spirometer is a tool that can help keep your lungs clear and active. This tool measures how well you are filling your lungs with each breath. Taking long deep breaths may help reverse or decrease the chance of developing breathing (pulmonary) problems (especially infection) following:  A long period of time when you are unable to move or be active. BEFORE THE PROCEDURE   If the spirometer includes an indicator to show your best effort, your nurse or respiratory therapist will set it to a desired goal.  If possible, sit up straight or lean slightly forward. Try not to slouch.  Hold the incentive spirometer in an upright position. INSTRUCTIONS FOR USE  1. Sit on the edge of your bed if possible, or sit up as far as you can in bed or on a chair. 2. Hold the incentive spirometer in an upright position. 3. Breathe out normally. 4. Place the mouthpiece in your mouth and seal your lips tightly around it. 5. Breathe in slowly and as deeply as possible, raising the piston or the ball toward the top of the column. 6. Hold your breath for 3-5  seconds or for as long as possible. Allow the piston or ball to fall to the bottom of the column. 7. Remove the mouthpiece from your mouth and breathe out normally. 8. Rest for a few seconds and repeat Steps 1 through 7 at least 10 times every 1-2 hours when you are awake. Take your time and take a few normal breaths between deep breaths. 9. The spirometer may include an indicator to show your best effort. Use the indicator as a goal to work toward during each repetition. 10. After each set of 10 deep breaths, practice coughing to be sure your lungs are clear. If you have an incision (the cut made at the time of surgery), support your incision when coughing by placing a pillow or rolled up towels firmly against it. Once you are able to get out of  bed, walk around indoors and cough well. You may stop using the incentive spirometer when instructed by your caregiver.  RISKS AND COMPLICATIONS  Take your time so you do not get dizzy or light-headed.  If you are in pain, you may need to take or ask for pain medication before doing incentive spirometry. It is harder to take a deep breath if you are having pain. AFTER USE  Rest and breathe slowly and easily.  It can be helpful to keep track of a log of your progress. Your caregiver can provide you with a simple table to help with this. If you are using the spirometer at home, follow these instructions: [place] IF:   You are having difficultly using the spirometer.  You have trouble using the spirometer as often as instructed.  Your pain medication is not giving enough relief while using the spirometer.  You develop fever of 100.5 F (38.1 C) or higher. SEEK IMMEDIATE MEDICAL CARE IF:   You cough up bloody sputum that had not been present before.  You develop fever of 102 F (38.9 C) or greater.  You develop worsening pain at or near the incision site. MAKE SURE YOU:   Understand these instructions.  Will watch your  condition.  Will get help right away if you are not doing well or get worse. Document Released: 06/13/2006 Document Revised: 04/25/2011 Document Reviewed: 08/14/2006 ExitCare Patient Information 2014 ExitCare, [place].   ________________________________________________________________________  WHAT IS A BLOOD TRANSFUSION? Blood Transfusion Information  A transfusion is the replacement of blood or some of its parts. Blood is made up of multiple cells which provide different functions.  Red blood cells carry oxygen and are used for blood loss replacement.  White blood cells fight against infection.  Platelets control bleeding.  Plasma helps clot blood.  Other blood products are available for specialized needs, such as hemophilia or other clotting disorders. BEFORE THE TRANSFUSION  Who gives blood for transfusions?   Healthy volunteers who are fully evaluated to make sure their blood is safe. This is blood bank blood. Transfusion therapy is the safest it has ever been in the practice of medicine. Before blood is taken from a donor, a complete history is taken to make sure that person has no history of diseases nor engages in risky social behavior (examples are intravenous drug use or sexual activity with multiple partners). The donor's travel history is screened to minimize risk of transmitting infections, such as malaria. The donated blood is tested for signs of infectious diseases, such as HIV and hepatitis. The blood is then tested to be sure it is compatible with you in order to minimize the chance of a transfusion reaction. If you or a relative donates blood, this is often done in anticipation of surgery and is not appropriate for emergency situations. It takes many days to process the donated blood. RISKS AND COMPLICATIONS Although transfusion therapy is very safe and saves many lives, the main dangers of transfusion include:   Getting an infectious disease.  Developing a transfusion  reaction. This is an allergic reaction to something in the blood you were given. Every precaution is taken to prevent this. The decision to have a blood transfusion has been considered carefully by your caregiver before blood is given. Blood is not given unless the benefits outweigh the risks. AFTER THE TRANSFUSION  Right after receiving a blood transfusion, you will usually feel much better and more energetic. This is especially true if your red blood  cells have gotten low (anemic). The transfusion raises the level of the red blood cells which carry oxygen, and this usually causes an energy increase.  The nurse administering the transfusion will monitor you carefully for complications. HOME CARE INSTRUCTIONS  No special instructions are needed after a transfusion. You may find your energy is better. Speak with your caregiver about any limitations on activity for underlying diseases you may have. SEEK MEDICAL CARE IF:   Your condition is not improving after your transfusion.  You develop redness or irritation at the intravenous (IV) site. SEEK IMMEDIATE MEDICAL CARE IF:  Any of the following symptoms occur over the next 12 hours:  Shaking chills.  You have a temperature by mouth above 102 F (38.9 C), not controlled by medicine.  Chest, back, or muscle pain.  People around you feel you are not acting correctly or are confused.  Shortness of breath or difficulty breathing.  Dizziness and fainting.  You get a rash or develop hives.  You have a decrease in urine output.  Your urine turns a dark color or changes to pink, red, or brown. Any of the following symptoms occur over the next 10 days:  You have a temperature by mouth above 102 F (38.9 C), not controlled by medicine.  Shortness of breath.  Weakness after normal activity.  The white part of the eye turns yellow (jaundice).  You have a decrease in the amount of urine or are urinating less often.  Your urine turns a  dark color or changes to pink, red, or brown. Document Released: 01/29/2000 Document Revised: 04/25/2011 Document Reviewed: 09/17/2007 [facility] Patient Information 2014 [place], [place].  _______________________________________________________________________

## 2018-05-22 NOTE — Progress Notes (Signed)
Per Anthony Banks from [facility], Dr Anthony Banks was shown cardiac defib orders and states he was okay with what Dr. Lovena Le recommended for mgmt of ICD

## 2018-05-23 NOTE — Progress Notes (Signed)
SPOKE W/  _ patient via phone     SCREENING SYMPTOMS OF COVID 19:   COUGH--No  RUNNY NOSE--- No  SORE THROAT---No  NASAL CONGESTION----No  SNEEZING----No  SHORTNESS OF BREATH---No  DIFFICULTY BREATHING---No  TEMP >100.4-----No  UNEXPLAINED BODY ACHES------No   HAVE YOU OR ANY FAMILY MEMBER TRAVELLED PAST 14 DAYS OUT OF THE   COUNTY---No STATE----No COUNTRY----No  HAVE YOU OR ANY FAMILY MEMBER BEEN EXPOSED TO ANYONE WITH COVID 19? No

## 2018-05-24 ENCOUNTER — Encounter (HOSPITAL_COMMUNITY): Payer: Self-pay

## 2018-05-24 ENCOUNTER — Encounter (HOSPITAL_COMMUNITY)
Admission: RE | Admit: 2018-05-24 | Discharge: 2018-05-24 | Disposition: A | Discharge status: Home / Self Care | Payer: Medicare HMO | Source: Ambulatory Visit | Attending: Urology | Admitting: Urology

## 2018-05-24 ENCOUNTER — Other Ambulatory Visit: Payer: Self-pay

## 2018-05-24 DIAGNOSIS — R9431 Abnormal electrocardiogram [ECG] [EKG]: Secondary | ICD-10-CM | POA: Insufficient documentation

## 2018-05-24 DIAGNOSIS — I519 Heart disease, unspecified: Secondary | ICD-10-CM | POA: Insufficient documentation

## 2018-05-24 DIAGNOSIS — Z01818 Encounter for other preprocedural examination: Secondary | ICD-10-CM | POA: Insufficient documentation

## 2018-05-24 HISTORY — DX: Malignant neoplasm of prostate: C61

## 2018-05-24 HISTORY — DX: Presence of automatic (implantable) cardiac defibrillator: Z95.810

## 2018-05-24 HISTORY — DX: Unspecified convulsions: R56.9

## 2018-05-24 LAB — BASIC METABOLIC PANEL
Anion gap: 7 (ref 5–15)
BUN: 24 mg/dL — ABNORMAL HIGH (ref 8–23)
CO2: 24 mmol/L (ref 22–32)
Calcium: 9.5 mg/dL (ref 8.9–10.3)
Chloride: 108 mmol/L (ref 98–111)
Creatinine, Ser: 0.95 mg/dL (ref 0.61–1.24)
GFR calc Af Amer: >60 mL/min (ref >60)
GFR calc non Af Amer: >60 mL/min (ref >60)
Glucose, Bld: 108 mg/dL — ABNORMAL HIGH (ref 70–99)
Potassium: 4.3 mmol/L (ref 3.5–5.1)
Sodium: 139 mmol/L (ref 135–145)

## 2018-05-24 LAB — CBC
HCT: 43.5 % (ref 39.0–52.0)
Hemoglobin: 14.4 g/dL (ref 13.0–17.0)
MCH: 32 pg (ref 26.0–34.0)
MCHC: 33.1 g/dL (ref 30.0–36.0)
MCV: 96.7 fL (ref 80.0–100.0)
Platelets: 152 10*3/uL (ref 150–400)
RBC: 4.5 MIL/uL (ref 4.22–5.81)
RDW: 12.7 % (ref 11.5–15.5)
WBC: 5.7 10*3/uL (ref 4.0–10.5)
nRBC: 0 % (ref 0.0–0.2)

## 2018-05-24 LAB — ABO/RH: ABO/RH(D): O POS

## 2018-05-24 NOTE — Progress Notes (Signed)
PCP: Lavone Orn  CARDIOLOGIST:dr taylor  INFO IN Epic:cbc, bmet, type and screen done 05-24-2018, ekg done 05-24-2018, cardiac clearance dr Lovena Le 05-17-2018, pacermaker check note 04-10-2018  INFO ON CHART:none  BLOOD THINNERS AND LAST DOSES:none ____________________________________  PATIENT SYMPTOMS AT TIME OF PREOP:none

## 2018-05-24 NOTE — Progress Notes (Signed)
Spoke with Anthony Banks no rep needed for pacemaker day of surgery per dr Alinda Money

## 2018-05-30 NOTE — H&P (Signed)
Office Visit Report     05/01/2018   --------------------------------------------------------------------------------   Anthony Banks  MRN: [id number]  PRIMARY CARE:  Anthony Ahmadi, MD  DOB: 1950-08-15, 68 year old Male  REFERRING:  Anthony Ahmadi, MD  SSN: [id number]  PROVIDER:  Franchot Banks, M.D.    TREATING:  Anthony Banks, M.D.    LOCATION:  [facility] [phone]   --------------------------------------------------------------------------------   CC/HPI: CC: Prostate Cancer   Physician requesting consult: Dr. Franchot Banks  PCP: Dr. Lavone Banks   Mr. Anthony Banks is a 68 year old gentleman gentleman who was found to have an elevated PSA of 4.4 prompting a TRUS biopsy of the prostate on 02/23/18. This demonstrated Gleason 4+3=7 adenocarcinoma with 4 out of 12 biopsy cores positive for malignancy.   Family history: None.   Imaging studies: None.   PMH: He has a history of seizures that began after he had a subdural hematoma requiring a craniotomy twice. The cause of his subdural hematoma was not completely clear. He had never had seizures prior to this. He is followed by neurology and managed with Dilantin and Keppra. He also has a history of prior symptomatic bradycardia status post pacemaker placement. 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  PSH: Pacemaker. No abdominal surgeries.   TNM stage: cT1c Nx Mx  PSA: 4.4  Gleason score: 4+3=7  Biopsy (02/23/18): 4/12 cores positive  Left: L lateral base (5%, 3+3=6)  Right: R lateral apex (20%, 3+4=7), R mid (10%, 4+3=7), R lateral mid (5%, 3+4=7)  Prostate volume: 29.1 cc   Nomogram  OC disease: 42%  EPE: 56%  SVI: 6%  LNI: 7%  PFS (5 year, 10 year): 74%, 60%   Urinary function: IPSS is 5.  Erectile function: SHIM score is 21.     ALLERGIES: Aspirin TABS Morphine Derivatives    MEDICATIONS: Viagra 100 mg tablet 1 tablet PO prn  Dilantin  Glucosamine  Trileptal     GU PSH:  Prostate Needle Biopsy - 02/23/2018      PSH Notes: Back Surgery  Pace maker Implant  Sub Dural Hematoma   NON-GU PSH: Bmi<30 And >=22 Calc & Docu - 04/30/2018 Doc Meds Verified W/Pt Or Re - 04/30/2018 Pain Neg No Plan - 04/30/2018 Surgical Pathology, Gross And Microscopic Examination For Prostate Needle - 02/23/2018    GU PMH: Prostate Cancer, Intermediate risk prostate cancer--PSA 4.4, 4/12 cores positive for prostate cancer--PSA density 0.15. Shim score 22 (he uses sildenafil) Minimal voiding symptoms with prostatic volume 29 mL. Kattan nomogram predictions: Organ confined disease--42% Lymph nodes/seminal vesicle involvement--7/6% respectively 5/10 year progression free probability after radical prostatectomy--75/60% respectively - 04/11/2018 Elevated PSA - 02/23/2018, He does have an elevating trend of PSA but his last PSA 2 months ago was a bit lower. Benign exam, - 01/01/2018 ED due to arterial insufficiency, He would like to try PDE 5 inhibitors - 01/01/2018 Gross hematuria, Gross hematuria - 2014 Nocturia, Nocturia - 2014 Urinary Frequency, Increased urinary frequency - 2014      PMH Notes:  1898-02-14 00:00:00 - Note: Normal Routine History And Physical Adult  2012-10-09 14:22:38 - Note: Arthritis   NON-GU PMH: Muscle weakness (generalized) - 04/30/2018 Arthritis Heart disease, unspecified Seizure disorder    FAMILY HISTORY: Death In The Family Father - Runs In Family Death In The Family Mother - Runs In Family   SOCIAL HISTORY: Marital Status: Married Current Smoking Status: Patient has never smoked.  Tobacco Use Assessment Completed: Used Tobacco in last 30 days? Drinks 1 drink per day.  Drinks 1 caffeinated drink per day. Patient's occupation is/was Retired.     Notes: Alcohol Use, Never A Smoker, Occupation:, Marital History - Currently Married, Caffeine Use   REVIEW OF SYSTEMS:    GU Review Male:   Patient denies frequent urination, hard to postpone urination,  burning/ pain with urination, get up at night to urinate, leakage of urine, stream starts and stops, trouble starting your streams, and have to strain to urinate .  Gastrointestinal (Lower):   Patient denies diarrhea and constipation.  Gastrointestinal (Upper):   Patient denies nausea and vomiting.  Constitutional:   Patient denies fever, night sweats, weight loss, and fatigue.  Skin:   Patient denies skin rash/ lesion and itching.  Eyes:   Patient denies blurred vision and double vision.  Ears/ Nose/ Throat:   Patient denies sore throat and sinus problems.  Hematologic/Lymphatic:   Patient denies swollen glands and easy bruising.  Cardiovascular:   Patient denies leg swelling and chest pains.  Respiratory:   Patient denies cough and shortness of breath.  Endocrine:   Patient denies excessive thirst.  Musculoskeletal:   Patient denies back pain and joint pain.  Neurological:   Patient denies headaches and dizziness.  Psychologic:   Patient denies depression and anxiety.   VITAL SIGNS:      05/01/2018 08:26 AM  Weight 255 lb / 115.67 kg  Height 78 in / 198.12 cm  BP 134/89 mmHg  Pulse 69 /min  Temperature 98.3 F / 36.8 C  BMI 29.5 kg/m   GU PHYSICAL EXAMINATION:    Prostate: Prostate about 30 grams. Left lobe normal consistency, right lobe normal consistency. Symmetrical lobes. No prostate nodule. Left lobe no tenderness, right lobe no tenderness.    MULTI-SYSTEM PHYSICAL EXAMINATION:    Constitutional: Well-nourished. No physical deformities. Normally developed. Good grooming.  Neck: Neck symmetrical, not swollen. Normal tracheal position.  Respiratory: No labored breathing, no use of accessory muscles. Clear bilaterally.  Cardiovascular: Normal temperature, normal extremity pulses, no swelling, no varicosities. Regular rate and rhythm.  Lymphatic: No enlargement of neck, axillae, groin.  Skin: No paleness, no jaundice, no cyanosis. No lesion, no ulcer, no rash.  Neurologic /  Psychiatric: Oriented to time, oriented to place, oriented to person. No depression, no anxiety, no agitation.  Gastrointestinal: No mass, no tenderness, no rigidity, non obese abdomen.  Eyes: Normal conjunctivae. Normal eyelids.  Ears, Nose, Mouth, and Throat: Left ear no scars, no lesions, no masses. Right ear no scars, no lesions, no masses. Nose no scars, no lesions, no masses. Normal hearing. Normal lips.  Musculoskeletal: Normal gait and station of head and neck.     PAST DATA REVIEWED:  Source Of History:  Patient  Lab Test Review:   PSA  Records Review:   Pathology Reports, Previous Patient Records   01/01/18 11/09/17 08/09/17 08/04/16 07/30/14 06/27/12 06/14/10 04/10/09  PSA  Total PSA 4.40 ng/mL 4.06 ng/dl 4.2 ng/dl 2.66 ng/dl 1.5 ng/dl 1.49 ng/dl 1.6 ng/dl 1.34 ng/dl  % Free PSA   15 %         PROCEDURES: None   ASSESSMENT:      ICD-10 Details  1 GU:   Prostate Cancer - C61    PLAN:           Schedule Return Visit/Planned Activity: Other See Visit Notes             Note:  Will call to schedule surgery          Document Letter(s):  Created for Patient: Clinical Summary         Notes:   1. Unfavorable intermediate risk prostate cancer: I had a detailed discussion with Mr. Sedore today regarding his prostate cancer. He is very well informed through his prior discussions with Dr. Diona Fanti and would like to proceed with surgical therapy.   The patient was counseled about the natural history of prostate cancer and the standard treatment options that are available for prostate cancer. It was explained to him how his age and life expectancy, clinical stage, Gleason score, and PSA affect his prognosis, the decision to proceed with additional staging studies, as well as how that information influences recommended treatment strategies. We discussed the roles for active surveillance, radiation therapy, surgical therapy, androgen deprivation, as well as ablative therapy options for  the treatment of prostate cancer as appropriate to his individual cancer situation. We discussed the risks and benefits of these options with regard to their impact on cancer control and also in terms of potential adverse events, complications, and impact on quality of life particularly related to urinary and sexual function. The patient was encouraged to ask questions throughout the discussion today and all questions were answered to his stated satisfaction. In addition, the patient was provided with and/or directed to appropriate resources and literature for further education about prostate cancer and treatment options. We discussed surgical therapy for prostate cancer including the different available surgical approaches. We discussed, in detail, the risks and expectations of surgery with regard to cancer control, urinary control, and erectile function as well as the expected postoperative recovery process. Additional risks of surgery including but not limited to bleeding, infection, hernia formation, nerve damage, lymphocele formation, bowel/rectal injury potentially necessitating colostomy, damage to the urinary tract resulting in urine leakage, urethral stricture, and the cardiopulmonary risks such as myocardial infarction, stroke, death, venothromboembolism, etc. were explained. The risk of open surgical conversion for robotic/laparoscopic prostatectomy was also discussed.   After discussion today, he does wish to proceed with surgery as planned. He will be scheduled for a bilateral nerve-sparing robot assisted laparoscopic radical prostatectomy and pelvic lymphadenectomy. His pacemaker will need to be interrogated in the perioperative period.   cc: Dr. Lavone Banks  Dr. Franchot Banks  Anthony Banks  Dr. Ellouise Newer         Next Appointment:      Next Appointment: 05/02/2018 10:00 AM    Appointment Type: 60 Physical Therapy    Location: [facility] [phone]     Provider: Lovenia Kim      E & M CODE: I spent at least 47 minutes face to face with the patient, more than 50% of that time was spent on counseling and/or coordinating care.     * Signed by Anthony Banks, M.D. on 05/01/18 at 9:44 PM (EDT)*

## 2018-05-30 NOTE — Progress Notes (Signed)
Anesthesia Chart Review   Case:  [id number] Date/Time:  05/31/18 1115   Procedures:      XI ROBOTIC ASSISTED LAPAROSCOPIC RADICAL PROSTATECTOMY LEVEL 2 (N/A )     LYMPHADENECTOMY, PELVIC (Bilateral )   Anesthesia type:  General   Pre-op diagnosis:  PROSTATE CANCER   Location:  [place] [facility] / [facility]   Surgeon:  Raynelle Bring, MD      DISCUSSION: 68 yo never smoker with h/o simple partial seizures after recurrent subdural hematoma s/p evacuation x 2 (2018, 2019; followed by neurology, asymptomatic since 03/2017, maintained on oxcarbazepine), HLD, h/o symptomatic bradycardia with AICD placed 2017 (last interrogation 04/10/2018), prostate cancer scheduled for above procedure 05/31/18 with Dr. Raynelle Bring.   Pt last seen by cardiologist, Dr. Cristopher Peru, via telemedicine 05/17/18.  Per his note, "The patient is pending radical prostatectomy. He is low risk for major cardiac complications. He may proceed with surgery."  Last seen by neurologist, Dr. Ellouise Newer, 03/21/18.  Pt seizure free since 03/2017.  Continues on oxcarbazepine.  1 year follow up recommended at this time.    Pt can proceed with planned procedure barring acute status change.  VS: BP (!) 146/93   Pulse 70   Temp 37.1 C (Oral)   Ht 6\' 7"  (2.007 m)   Wt 112 kg   SpO2 97%   BMI 27.83 kg/m   PROVIDERS: Lavone Orn, MD is PCP   Cristopher Peru, MD is Cardiologist   Ellouise Newer, MD is Neurologist  LABS: Labs reviewed: Acceptable for surgery. (all labs ordered are listed, but only abnormal results are displayed)  Labs Reviewed  BASIC METABOLIC PANEL - Abnormal; Notable for the following components:      Result Value   Glucose, Bld 108 (*)    BUN 24 (*)    All other components within normal limits  CBC  TYPE AND SCREEN  ABO/RH     IMAGES:   EKG: 05/24/2018 Rate 62 bpm Sinus rhythm with 1st degree AV block Otherwise normal ECG   CV: Echo 11/30/2015 Study Conclusions  - Left ventricle: The cavity size was  normal. Wall thickness was   increased in a pattern of mild LVH. Systolic function was normal.   The estimated ejection fraction was in the range of 60% to 65%.   Wall motion was normal; there were no regional wall motion   abnormalities. - Left atrium: The atrium was mildly dilated. - Atrial septum: No defect or patent foramen ovale was identified. Past Medical History:  Diagnosis Date  . AICD (automatic cardioverter/defibrillator) present 2017   st jude  . Borderline hyperlipidemia   . Cancer ([place])    basal face Dr [name]  . Lumbar degenerative disc disease   . Nummular eczema   . Overweight   . Prostate cancer ([place])   . Seizure ([place]) 02/2017  . Subdural hematoma ([place]) dec17 2018, jan 2019  . Syncope     Past Surgical History:  Procedure Laterality Date  . basal/squamous cell face    . bilateral blepharoplasy    . COLONOSCOPY     polyps remove  . COLONOSCOPY WITH PROPOFOL N/A 11/24/2014   Procedure: COLONOSCOPY WITH PROPOFOL;  Surgeon: Garlan Fair, MD;  Location: [facility];  Service: Endoscopy;  Laterality: N/A;  . CRANIOTOMY N/A 01/30/2017   Procedure: CRANIOTOMY HEMATOMA EVACUATION SUBDURAL;  Surgeon: Consuella Lose, MD;  Location: [place];  Service: Neurosurgery;  Laterality: N/A;  CRANIOTOMY HEMATOMA EVACUATION SUBDURAL  . CRANIOTOMY Left  03/07/2017   Procedure: CRANIOTOMY HEMATOMA EVACUATION SUBDURAL;  Surgeon: Consuella Lose, MD;  Location: [place];  Service: Neurosurgery;  Laterality: Left;  . EP IMPLANTABLE DEVICE N/A 11/30/2015   Procedure: Pacemaker Implant;  Surgeon: Evans Lance, MD;  Location: [place] [facility];  Service: Cardiovascular;  Laterality: N/A;  . L3 Gill procedure with L3 PLIF with interbody implant L3-4 posterlateral asthrodesis and posterior instrumentation of bone graft    . nasal polyps    . NASAL SEPTUM SURGERY    . VASECTOMY      MEDICATIONS: . acetaminophen (TYLENOL) 325 MG tablet  . glucosamine-chondroitin 500-400 MG  tablet  . oxcarbazepine (TRILEPTAL) 600 MG tablet   No current facility-administered medications for this encounter.     Maia Plan [facility] Pre-Surgical Testing [phone] 05/30/18 2:31 PM

## 2018-05-30 NOTE — Anesthesia Preprocedure Evaluation (Addendum)
Anesthesia Evaluation  Patient identified by MRN, date of birth, ID band Patient awake    Reviewed: Allergy & Precautions, NPO status , Patient's Chart, lab work & pertinent test results  History of Anesthesia Complications Negative for: history of anesthetic complications  Airway Mallampati: II  TM Distance: >3 FB Neck ROM: Full    Dental no notable dental hx. (+) Dental Advisory Given   Pulmonary neg pulmonary ROS,    Pulmonary exam normal        Cardiovascular Normal cardiovascular exam+ pacemaker   Echo 11/30/2015 Study Conclusions  - Left ventricle: The cavity size was normal. Wall thickness was increased in a pattern of mild LVH. Systolic function was normal. The estimated ejection fraction was in the range of 60% to 65%. Wall motion was normal; there were no regional wall motion abnormalities. - Left atrium: The atrium was mildly dilated. - Atrial septum: No defect or patent foramen ovale was identified.   Neuro/Psych Seizures -, Well Controlled,  negative psych ROS   GI/Hepatic negative GI ROS, Neg liver ROS,   Endo/Other  negative endocrine ROS  Renal/GU      Musculoskeletal  (+) Arthritis ,   Abdominal   Peds  Hematology   Anesthesia Other Findings   Reproductive/Obstetrics                                                               Anesthesia Evaluation  Patient identified by MRN, date of birth, ID band Patient awake    Reviewed: Allergy & Precautions, NPO status , Patient's Chart, lab work & pertinent test results  History of Anesthesia Complications Negative for: history of anesthetic complications  Airway Mallampati: II  TM Distance: >3 FB Neck ROM: Full    Dental  (+) Teeth Intact, Dental Advisory Given   Pulmonary    breath sounds clear to auscultation       Cardiovascular negative cardio ROS   Rhythm:Regular      Neuro/Psych Seizures -,     GI/Hepatic negative GI ROS, Neg liver ROS,   Endo/Other  negative endocrine ROS  Renal/GU negative Renal ROS     Musculoskeletal  (+) Arthritis ,   Abdominal   Peds  Hematology negative hematology ROS (+)   Anesthesia Other Findings SDH recurrence   Reproductive/Obstetrics                                                              Anesthesia Evaluation  Patient identified by MRN, date of birth, ID band Patient awake    Reviewed: Allergy & Precautions, NPO status , Patient's Chart, lab work & pertinent test results  Airway Mallampati: II  TM Distance: >3 FB Neck ROM: Full    Dental  (+) Dental Advisory Given   Pulmonary neg pulmonary ROS,    breath sounds clear to auscultation       Cardiovascular negative cardio ROS   Rhythm:Regular Rate:Normal     Neuro/Psych Headaches: acute subdural hematoma., Subdural hematoma    GI/Hepatic negative GI ROS, Neg liver ROS,   Endo/Other  negative endocrine  ROS  Renal/GU negative Renal ROS     Musculoskeletal  (+) Arthritis ,   Abdominal   Peds  Hematology negative hematology ROS (+)   Anesthesia Other Findings   Reproductive/Obstetrics                            Lab Results  Component Value Date   WBC 5.7 05/24/2018   HGB 14.4 05/24/2018   HCT 43.5 05/24/2018   MCV 96.7 05/24/2018   PLT 152 05/24/2018   Lab Results  Component Value Date   CREATININE 0.95 05/24/2018   BUN 24 (H) 05/24/2018   NA 139 05/24/2018   K 4.3 05/24/2018   CL 108 05/24/2018   CO2 24 05/24/2018    Anesthesia Physical Anesthesia Plan  ASA: III  Anesthesia Plan: General   Post-op Pain Management:    Induction: Intravenous  PONV Risk Score and Plan: 2 and Ondansetron, Dexamethasone and Treatment may vary due to age or medical condition  Airway Management Planned: Oral ETT  Additional Equipment: Arterial line  Intra-op  Plan:   Post-operative Plan: Extubation in OR  Informed Consent: I have reviewed the patients History and Physical, chart, labs and discussed the procedure including the risks, benefits and alternatives for the proposed anesthesia with the patient or authorized representative who has indicated his/her understanding and acceptance.   Dental advisory given  Plan Discussed with: CRNA  Anesthesia Plan Comments:        Anesthesia Quick Evaluation  Anesthesia Physical Anesthesia Plan  ASA: II  Anesthesia Plan: General   Post-op Pain Management:    Induction: Intravenous  PONV Risk Score and Plan: 2 and Ondansetron  Airway Management Planned: Oral ETT  Additional Equipment: Arterial line  Intra-op Plan:   Post-operative Plan: Extubation in OR  Informed Consent: I have reviewed the patients History and Physical, chart, labs and discussed the procedure including the risks, benefits and alternatives for the proposed anesthesia with the patient or authorized representative who has indicated his/her understanding and acceptance.   Dental advisory given  Plan Discussed with: CRNA and Surgeon  Anesthesia Plan Comments:         Anesthesia Quick Evaluation  Anesthesia Physical Anesthesia Plan  ASA: III  Anesthesia Plan: General   Post-op Pain Management:    Induction: Intravenous  PONV Risk Score and Plan: 4 or greater and Ondansetron, Dexamethasone, Scopolamine patch - Pre-op and Diphenhydramine  Airway Management Planned: Oral ETT  Additional Equipment:   Intra-op Plan:   Post-operative Plan: Extubation in OR  Informed Consent: I have reviewed the patients History and Physical, chart, labs and discussed the procedure including the risks, benefits and alternatives for the proposed anesthesia with the patient or authorized representative who has indicated his/her understanding and acceptance.     Dental advisory given  Plan Discussed with: CRNA and  Anesthesiologist  Anesthesia Plan Comments: (See PAT note 05/24/18, Konrad Felix, PA-C)      Anesthesia Quick Evaluation

## 2018-05-30 NOTE — Progress Notes (Signed)
PATIENT CALLING TO CLARIFY INSTRUCTIONS HE RECEIVED AT HIS PRE-OP ABOUT WHICH MEDICATIONS HE MAY TAKE ON MORNING OF SURGERY  DAY. THIS RN REVIEWED PATIENTS INSTRUCTIONS IN  THAT HE RECEIVED AT HIS PRE-OP APPT AND CONFIRMED WITH HIM THAT PER THE PRE-OP NURSE , HE WAS TO TAKE HIS TRILEPTAL THE MORNING OF SURGERY . PATIENT VERBALIZED UNDERSTANDING AND THANKED THIS RN FOR THE ASSISTANCE.

## 2018-05-31 ENCOUNTER — Encounter (HOSPITAL_COMMUNITY)
Admission: RE | Disposition: A | Discharge status: Home / Self Care | Payer: Self-pay | Source: Home / Self Care | Attending: Urology

## 2018-05-31 ENCOUNTER — Encounter (HOSPITAL_COMMUNITY): Payer: Self-pay | Admitting: Emergency Medicine

## 2018-05-31 ENCOUNTER — Ambulatory Visit (HOSPITAL_COMMUNITY): Payer: Medicare HMO | Admitting: Physician Assistant

## 2018-05-31 ENCOUNTER — Other Ambulatory Visit: Payer: Self-pay

## 2018-05-31 ENCOUNTER — Observation Stay (HOSPITAL_COMMUNITY)
Admission: RE | Admit: 2018-05-31 | Discharge: 2018-06-01 | Disposition: A | Discharge status: Home / Self Care | Payer: Medicare HMO | Attending: Urology | Admitting: Urology

## 2018-05-31 ENCOUNTER — Ambulatory Visit (HOSPITAL_COMMUNITY): Payer: Medicare HMO | Admitting: Anesthesiology

## 2018-05-31 DIAGNOSIS — M1990 Unspecified osteoarthritis, unspecified site: Secondary | ICD-10-CM | POA: Diagnosis not present

## 2018-05-31 DIAGNOSIS — C61 Malignant neoplasm of prostate: Secondary | ICD-10-CM | POA: Diagnosis not present

## 2018-05-31 DIAGNOSIS — G40909 Epilepsy, unspecified, not intractable, without status epilepticus: Secondary | ICD-10-CM | POA: Diagnosis not present

## 2018-05-31 DIAGNOSIS — Z79899 Other long term (current) drug therapy: Secondary | ICD-10-CM | POA: Diagnosis not present

## 2018-05-31 DIAGNOSIS — Z950 Presence of cardiac pacemaker: Secondary | ICD-10-CM | POA: Diagnosis not present

## 2018-05-31 HISTORY — PX: LYMPHADENECTOMY: SHX5960

## 2018-05-31 HISTORY — PX: ROBOT ASSISTED LAPAROSCOPIC RADICAL PROSTATECTOMY: SHX5141

## 2018-05-31 LAB — TYPE AND SCREEN
ABO/RH(D): O POS
Antibody Screen: NEGATIVE

## 2018-05-31 LAB — HEMOGLOBIN AND HEMATOCRIT, BLOOD
HCT: 45.8 % (ref 39.0–52.0)
Hemoglobin: 14.8 g/dL (ref 13.0–17.0)

## 2018-05-31 SURGERY — XI ROBOTIC ASSISTED LAPAROSCOPIC RADICAL PROSTATECTOMY LEVEL 2
Anesthesia: General

## 2018-05-31 MED ORDER — DOCUSATE SODIUM 100 MG PO CAPS
100.0000 mg | ORAL_CAPSULE | Freq: Two times a day (BID) | ORAL | Status: DC
  Administered 2018-05-31 – 2018-06-01 (×2): 100 mg via ORAL
  Filled 2018-05-31 (×2): qty 1

## 2018-05-31 MED ORDER — BUPIVACAINE-EPINEPHRINE (PF) 0.25% -1:200000 IJ SOLN
INTRAMUSCULAR | Status: AC
  Filled 2018-05-31: qty 30

## 2018-05-31 MED ORDER — DEXAMETHASONE SODIUM PHOSPHATE 10 MG/ML IJ SOLN
INTRAMUSCULAR | Status: DC | PRN
  Administered 2018-05-31: 10 mg via INTRAVENOUS

## 2018-05-31 MED ORDER — ACETAMINOPHEN 500 MG PO TABS
1000.0000 mg | ORAL_TABLET | Freq: Once | ORAL | Status: AC
  Administered 2018-05-31: 1000 mg via ORAL
  Filled 2018-05-31: qty 2

## 2018-05-31 MED ORDER — CEFAZOLIN SODIUM-DEXTROSE 2-4 GM/100ML-% IV SOLN
2.0000 g | INTRAVENOUS | Status: AC
  Administered 2018-05-31: 11:00:00 2 g via INTRAVENOUS
  Filled 2018-05-31: qty 100

## 2018-05-31 MED ORDER — PROMETHAZINE HCL 25 MG/ML IJ SOLN: 6.2500 mg | INTRAMUSCULAR | Status: DC | PRN

## 2018-05-31 MED ORDER — FENTANYL CITRATE (PF) 100 MCG/2ML IJ SOLN
25.0000 ug | INTRAMUSCULAR | Status: DC | PRN
  Administered 2018-05-31: 50 ug via INTRAVENOUS

## 2018-05-31 MED ORDER — SUGAMMADEX SODIUM 500 MG/5ML IV SOLN
INTRAVENOUS | Status: AC
  Filled 2018-05-31: qty 5

## 2018-05-31 MED ORDER — ACETAMINOPHEN 325 MG PO TABS
650.0000 mg | ORAL_TABLET | ORAL | Status: DC | PRN
  Administered 2018-05-31 – 2018-06-01 (×2): 650 mg via ORAL
  Filled 2018-05-31 (×2): qty 2

## 2018-05-31 MED ORDER — LIDOCAINE 2% (20 MG/ML) 5 ML SYRINGE
INTRAMUSCULAR | Status: AC
  Filled 2018-05-31: qty 5

## 2018-05-31 MED ORDER — SODIUM CHLORIDE 0.9 % IR SOLN
Status: DC | PRN
  Administered 2018-05-31: 1000 mL via INTRAVESICAL

## 2018-05-31 MED ORDER — FLEET ENEMA 7-19 GM/118ML RE ENEM: 1.0000 | ENEMA | Freq: Once | RECTAL | Status: DC

## 2018-05-31 MED ORDER — LACTATED RINGERS IV SOLN
INTRAVENOUS | Status: DC
  Administered 2018-05-31 (×3): via INTRAVENOUS

## 2018-05-31 MED ORDER — DEXAMETHASONE SODIUM PHOSPHATE 10 MG/ML IJ SOLN
INTRAMUSCULAR | Status: AC
  Filled 2018-05-31: qty 1

## 2018-05-31 MED ORDER — CEFAZOLIN SODIUM-DEXTROSE 1-4 GM/50ML-% IV SOLN
1.0000 g | Freq: Three times a day (TID) | INTRAVENOUS | Status: AC
  Administered 2018-05-31 – 2018-06-01 (×2): 1 g via INTRAVENOUS
  Filled 2018-05-31 (×2): qty 50

## 2018-05-31 MED ORDER — ZOLPIDEM TARTRATE 5 MG PO TABS: 5.0000 mg | ORAL_TABLET | Freq: Every evening | ORAL | Status: DC | PRN

## 2018-05-31 MED ORDER — SUCCINYLCHOLINE CHLORIDE 200 MG/10ML IV SOSY
PREFILLED_SYRINGE | INTRAVENOUS | Status: DC | PRN
  Administered 2018-05-31: 120 mg via INTRAVENOUS

## 2018-05-31 MED ORDER — HEPARIN SODIUM (PORCINE) 1000 UNIT/ML IJ SOLN
INTRAMUSCULAR | Status: AC
  Filled 2018-05-31: qty 1

## 2018-05-31 MED ORDER — MAGNESIUM CITRATE PO SOLN: 1.0000 | Freq: Once | ORAL | Status: DC

## 2018-05-31 MED ORDER — SCOPOLAMINE 1 MG/3DAYS TD PT72
1.0000 | MEDICATED_PATCH | TRANSDERMAL | Status: DC
  Administered 2018-05-31: 1.5 mg via TRANSDERMAL
  Filled 2018-05-31: qty 1

## 2018-05-31 MED ORDER — ONDANSETRON HCL 4 MG/2ML IJ SOLN: 4.0000 mg | INTRAMUSCULAR | Status: DC | PRN

## 2018-05-31 MED ORDER — ONDANSETRON HCL 4 MG/2ML IJ SOLN
INTRAMUSCULAR | Status: DC | PRN
  Administered 2018-05-31: 4 mg via INTRAVENOUS

## 2018-05-31 MED ORDER — ACETAMINOPHEN 10 MG/ML IV SOLN
1000.0000 mg | Freq: Four times a day (QID) | INTRAVENOUS | Status: DC
  Administered 2018-05-31 (×2): 1000 mg via INTRAVENOUS
  Filled 2018-05-31 (×4): qty 100

## 2018-05-31 MED ORDER — OXCARBAZEPINE 300 MG PO TABS
600.0000 mg | ORAL_TABLET | Freq: Two times a day (BID) | ORAL | Status: DC
  Administered 2018-06-01: 600 mg via ORAL
  Filled 2018-05-31 (×2): qty 2

## 2018-05-31 MED ORDER — FENTANYL CITRATE (PF) 100 MCG/2ML IJ SOLN
INTRAMUSCULAR | Status: AC
  Filled 2018-05-31: qty 2

## 2018-05-31 MED ORDER — FENTANYL CITRATE (PF) 100 MCG/2ML IJ SOLN: 50.0000 ug | INTRAMUSCULAR | Status: DC | PRN

## 2018-05-31 MED ORDER — SUCCINYLCHOLINE CHLORIDE 200 MG/10ML IV SOSY
PREFILLED_SYRINGE | INTRAVENOUS | Status: AC
  Filled 2018-05-31: qty 10

## 2018-05-31 MED ORDER — PHENYLEPHRINE 40 MCG/ML (10ML) SYRINGE FOR IV PUSH (FOR BLOOD PRESSURE SUPPORT)
PREFILLED_SYRINGE | INTRAVENOUS | Status: AC
  Filled 2018-05-31: qty 10

## 2018-05-31 MED ORDER — LIDOCAINE 2% (20 MG/ML) 5 ML SYRINGE
INTRAMUSCULAR | Status: DC | PRN
  Administered 2018-05-31: 60 mg via INTRAVENOUS

## 2018-05-31 MED ORDER — DIPHENHYDRAMINE HCL 12.5 MG/5ML PO ELIX: 12.5000 mg | ORAL_SOLUTION | Freq: Four times a day (QID) | ORAL | Status: DC | PRN

## 2018-05-31 MED ORDER — SUFENTANIL CITRATE 50 MCG/ML IV SOLN
INTRAVENOUS | Status: DC | PRN
  Administered 2018-05-31: 5 ug via INTRAVENOUS
  Administered 2018-05-31 (×2): 10 ug via INTRAVENOUS
  Administered 2018-05-31: 15 ug via INTRAVENOUS
  Administered 2018-05-31: 10 ug via INTRAVENOUS

## 2018-05-31 MED ORDER — OXCARBAZEPINE 300 MG PO TABS
600.0000 mg | ORAL_TABLET | Freq: Once | ORAL | Status: AC
  Administered 2018-05-31: 600 mg via ORAL
  Filled 2018-05-31: qty 2

## 2018-05-31 MED ORDER — PROPOFOL 10 MG/ML IV BOLUS
INTRAVENOUS | Status: DC | PRN
  Administered 2018-05-31: 130 mg via INTRAVENOUS

## 2018-05-31 MED ORDER — PROPOFOL 10 MG/ML IV BOLUS
INTRAVENOUS | Status: AC
  Filled 2018-05-31: qty 20

## 2018-05-31 MED ORDER — SUGAMMADEX SODIUM 200 MG/2ML IV SOLN
INTRAVENOUS | Status: DC | PRN
  Administered 2018-05-31: 250 mg via INTRAVENOUS

## 2018-05-31 MED ORDER — DIPHENHYDRAMINE HCL 50 MG/ML IJ SOLN
INTRAMUSCULAR | Status: DC | PRN
  Administered 2018-05-31: 12.5 mg via INTRAVENOUS

## 2018-05-31 MED ORDER — MIDAZOLAM HCL 2 MG/2ML IJ SOLN
INTRAMUSCULAR | Status: DC | PRN
  Administered 2018-05-31: 2 mg via INTRAVENOUS

## 2018-05-31 MED ORDER — TRAMADOL HCL 50 MG PO TABS: 50.0000 mg | ORAL_TABLET | Freq: Four times a day (QID) | ORAL | 0 refills | Status: DC | PRN

## 2018-05-31 MED ORDER — SODIUM CHLORIDE 0.9 % IV BOLUS
1000.0000 mL | Freq: Once | INTRAVENOUS | Status: AC
  Administered 2018-05-31: 1000 mL via INTRAVENOUS

## 2018-05-31 MED ORDER — KCL IN DEXTROSE-NACL 20-5-0.45 MEQ/L-%-% IV SOLN
INTRAVENOUS | Status: DC
  Administered 2018-05-31 (×2): via INTRAVENOUS
  Filled 2018-05-31 (×4): qty 1000

## 2018-05-31 MED ORDER — DIPHENHYDRAMINE HCL 50 MG/ML IJ SOLN: 12.5000 mg | Freq: Four times a day (QID) | INTRAMUSCULAR | Status: DC | PRN

## 2018-05-31 MED ORDER — PHENYLEPHRINE 40 MCG/ML (10ML) SYRINGE FOR IV PUSH (FOR BLOOD PRESSURE SUPPORT)
PREFILLED_SYRINGE | INTRAVENOUS | Status: DC | PRN
  Administered 2018-05-31 (×2): 80 ug via INTRAVENOUS

## 2018-05-31 MED ORDER — SUFENTANIL CITRATE 50 MCG/ML IV SOLN
INTRAVENOUS | Status: AC
  Filled 2018-05-31: qty 1

## 2018-05-31 MED ORDER — ONDANSETRON HCL 4 MG/2ML IJ SOLN
INTRAMUSCULAR | Status: AC
  Filled 2018-05-31: qty 2

## 2018-05-31 MED ORDER — ROCURONIUM BROMIDE 10 MG/ML (PF) SYRINGE
PREFILLED_SYRINGE | INTRAVENOUS | Status: AC
  Filled 2018-05-31: qty 10

## 2018-05-31 MED ORDER — MIDAZOLAM HCL 2 MG/2ML IJ SOLN
INTRAMUSCULAR | Status: AC
  Filled 2018-05-31: qty 2

## 2018-05-31 MED ORDER — SODIUM CHLORIDE (PF) 0.9 % IJ SOLN
INTRAMUSCULAR | Status: AC
  Filled 2018-05-31: qty 10

## 2018-05-31 MED ORDER — SULFAMETHOXAZOLE-TRIMETHOPRIM 800-160 MG PO TABS: 1.0000 | ORAL_TABLET | Freq: Two times a day (BID) | ORAL | 0 refills | Status: DC

## 2018-05-31 MED ORDER — BACITRACIN-NEOMYCIN-POLYMYXIN 400-5-5000 EX OINT: 1.0000 "application " | TOPICAL_OINTMENT | Freq: Three times a day (TID) | CUTANEOUS | Status: DC | PRN

## 2018-05-31 MED ORDER — ROCURONIUM BROMIDE 10 MG/ML (PF) SYRINGE
PREFILLED_SYRINGE | INTRAVENOUS | Status: DC | PRN
  Administered 2018-05-31: 30 mg via INTRAVENOUS
  Administered 2018-05-31: 20 mg via INTRAVENOUS
  Administered 2018-05-31: 50 mg via INTRAVENOUS
  Administered 2018-05-31 (×3): 10 mg via INTRAVENOUS

## 2018-05-31 MED ORDER — LACTATED RINGERS IV SOLN
INTRAVENOUS | Status: DC | PRN
  Administered 2018-05-31: 12:00:00 1000 mL

## 2018-05-31 MED ORDER — BUPIVACAINE-EPINEPHRINE 0.25% -1:200000 IJ SOLN
INTRAMUSCULAR | Status: DC | PRN
  Administered 2018-05-31: 30 mL

## 2018-05-31 SURGICAL SUPPLY — 60 items
ADH SKN CLS APL DERMABOND .7 (GAUZE/BANDAGES/DRESSINGS) ×2
APL PRP STRL LF DISP 70% ISPRP (MISCELLANEOUS) ×2
APL SWBSTK 6 STRL LF DISP (MISCELLANEOUS) ×2
APPLICATOR COTTON TIP 6 STRL (MISCELLANEOUS) ×2 IMPLANT
APPLICATOR COTTON TIP 6IN STRL (MISCELLANEOUS) ×3
CATH FOLEY 2WAY SLVR 18FR 30CC (CATHETERS) ×3 IMPLANT
CATH ROBINSON RED A/P 16FR (CATHETERS) ×3 IMPLANT
CATH ROBINSON RED A/P 8FR (CATHETERS) ×3 IMPLANT
CATH TIEMANN FOLEY 18FR 5CC (CATHETERS) ×3 IMPLANT
CHLORAPREP W/TINT 26 (MISCELLANEOUS) ×3 IMPLANT
CLIP VESOLOCK LG 6/CT PURPLE (CLIP) ×7 IMPLANT
COVER SURGICAL LIGHT HANDLE (MISCELLANEOUS) ×3 IMPLANT
COVER TIP SHEARS 8 DVNC (MISCELLANEOUS) ×2 IMPLANT
COVER TIP SHEARS 8MM DA VINCI (MISCELLANEOUS) ×1
COVER WAND RF STERILE (DRAPES) ×1 IMPLANT
CUTTER ECHEON FLEX ENDO 45 340 (ENDOMECHANICALS) ×3 IMPLANT
DECANTER SPIKE VIAL GLASS SM (MISCELLANEOUS) ×3 IMPLANT
DERMABOND ADVANCED (GAUZE/BANDAGES/DRESSINGS) ×1
DERMABOND ADVANCED .7 DNX12 (GAUZE/BANDAGES/DRESSINGS) IMPLANT
DRAPE ARM DVNC X/XI (DISPOSABLE) ×8 IMPLANT
DRAPE COLUMN DVNC XI (DISPOSABLE) ×2 IMPLANT
DRAPE DA VINCI XI ARM (DISPOSABLE) ×4
DRAPE DA VINCI XI COLUMN (DISPOSABLE) ×1
DRAPE SURG IRRIG POUCH 19X23 (DRAPES) ×3 IMPLANT
DRSG TEGADERM 4X4.75 (GAUZE/BANDAGES/DRESSINGS) ×3 IMPLANT
ELECT REM PT RETURN 15FT ADLT (MISCELLANEOUS) ×3 IMPLANT
GLOVE BIO SURGEON STRL SZ 6.5 (GLOVE) ×3 IMPLANT
GLOVE BIO SURGEON STRL SZ7 (GLOVE) IMPLANT
GLOVE BIOGEL M STRL SZ7.5 (GLOVE) ×6 IMPLANT
GLOVE BIOGEL PI IND STRL 7.5 (GLOVE) IMPLANT
GLOVE BIOGEL PI INDICATOR 7.5 (GLOVE)
GOWN STRL REUS W/TWL LRG LVL3 (GOWN DISPOSABLE) ×9 IMPLANT
HOLDER FOLEY CATH W/STRAP (MISCELLANEOUS) ×3 IMPLANT
IRRIG SUCT STRYKERFLOW 2 WTIP (MISCELLANEOUS) ×3
IRRIGATION SUCT STRKRFLW 2 WTP (MISCELLANEOUS) ×2 IMPLANT
KIT TURNOVER KIT A (KITS) ×1 IMPLANT
NDL SAFETY ECLIPSE 18X1.5 (NEEDLE) ×2 IMPLANT
NEEDLE HYPO 18GX1.5 SHARP (NEEDLE) ×3
PACK ROBOT UROLOGY CUSTOM (CUSTOM PROCEDURE TRAY) ×3 IMPLANT
RELOAD STAPLE 45 4.1 GRN THCK (STAPLE) ×2 IMPLANT
SEAL CANN UNIV 5-8 DVNC XI (MISCELLANEOUS) ×8 IMPLANT
SEAL XI 5MM-8MM UNIVERSAL (MISCELLANEOUS) ×4
SOLUTION ELECTROLUBE (MISCELLANEOUS) ×3 IMPLANT
STAPLE RELOAD 45 GRN (STAPLE) ×2 IMPLANT
STAPLE RELOAD 45MM GREEN (STAPLE) ×3
SUT ETHILON 3 0 PS 1 (SUTURE) ×3 IMPLANT
SUT MNCRL 3 0 RB1 (SUTURE) ×2 IMPLANT
SUT MNCRL 3 0 VIOLET RB1 (SUTURE) ×2 IMPLANT
SUT MNCRL AB 4-0 PS2 18 (SUTURE) ×6 IMPLANT
SUT MONOCRYL 3 0 RB1 (SUTURE) ×4
SUT VIC AB 0 CT1 27 (SUTURE) ×3
SUT VIC AB 0 CT1 27XBRD ANTBC (SUTURE) ×2 IMPLANT
SUT VIC AB 0 UR5 27 (SUTURE) ×3 IMPLANT
SUT VIC AB 2-0 SH 27 (SUTURE) ×3
SUT VIC AB 2-0 SH 27X BRD (SUTURE) ×2 IMPLANT
SUT VICRYL 0 UR6 27IN ABS (SUTURE) ×6 IMPLANT
SYR 27GX1/2 1ML LL SAFETY (SYRINGE) ×3 IMPLANT
TOWEL OR 17X26 10 PK STRL BLUE (TOWEL DISPOSABLE) ×2 IMPLANT
TOWEL OR NON WOVEN STRL DISP B (DISPOSABLE) ×3 IMPLANT
WATER STERILE IRR 1000ML POUR (IV SOLUTION) ×5 IMPLANT

## 2018-05-31 NOTE — Progress Notes (Signed)
Pt has medications locked in [place]. Please return to patient upon discharge.

## 2018-05-31 NOTE — Discharge Instructions (Signed)

## 2018-05-31 NOTE — Anesthesia Postprocedure Evaluation (Signed)
Anesthesia Post Note  Patient: Anthony Banks  Procedure(s) Performed: XI ROBOTIC ASSISTED LAPAROSCOPIC RADICAL PROSTATECTOMY LEVEL 2 (N/A ) LYMPHADENECTOMY, PELVIC (Bilateral )     Patient location during evaluation: PACU Anesthesia Type: General Level of consciousness: sedated Pain management: pain level controlled Vital Signs Assessment: post-procedure vital signs reviewed and stable Respiratory status: spontaneous breathing and respiratory function stable Cardiovascular status: stable Postop Assessment: no apparent nausea or vomiting Anesthetic complications: no    Last Vitals:  Vitals:   05/31/18 1545 05/31/18 1613  BP: 130/82 (!) 145/98  Pulse: 62 65  Resp: 12 16  Temp: 36.6 C 36.7 C  SpO2: 94% 98%    Last Pain:  Vitals:   05/31/18 1613  TempSrc: Oral  PainSc: 6                  Anthony Banks

## 2018-05-31 NOTE — Progress Notes (Signed)
Patient ID: Anthony Banks, male   DOB: 10/07/1950, 68 y.o.   MRN: [id number]  Post-op note  Subjective: The patient is doing well.  No complaints.  Objective: Vital signs in last 24 hours: Temp:  [97.8 F (36.6 C)-98.3 F (36.8 C)] 97.8 F (36.6 C) (04/16 1545) Pulse Rate:  [60-68] 62 (04/16 1545) Resp:  [8-18] 12 (04/16 1545) BP: (119-130)/(78-87) 130/82 (04/16 1545) SpO2:  [94 %-100 %] 94 % (04/16 1545) Weight:  [299 kg] 112 kg (04/16 1007)  Intake/Output from previous day: No intake/output data recorded. Intake/Output this shift: Total I/O In: 2550 [P.O.:50; I.V.:2400; IV Piggyback:100] Out: 290 [Urine:150; Drains:40; Blood:100]  Physical Exam:  General: Alert and oriented. Abdomen: Soft, Nondistended. Incisions: Clean and dry. GU: Urine clear  Lab Results: Recent Labs    05/31/18 1510  HGB 14.8  HCT 45.8    Assessment/Plan: POD#0   1) Continue to monitor, ambulate, IS   [name]. MD   LOS: 0 days   [name] 05/31/2018, 4:03 PM

## 2018-05-31 NOTE — Op Note (Signed)
Preoperative diagnosis: Clinically localized adenocarcinoma of the prostate (clinical stage T1c Nx Mx)  Postoperative diagnosis: Clinically localized adenocarcinoma of the prostate (clinical stage T1c Nx Mx)  Procedure:  1. Robotic assisted laparoscopic radical prostatectomy (bilateral nerve sparing) 2. Bilateral robotic assisted laparoscopic pelvic lymphadenectomy  Surgeon: [name]. M.D.  Assistant: Debbrah Alar, PA-C  An assistant was required for this surgical procedure.  The duties of the assistant included but were not limited to suctioning, passing suture, camera manipulation, retraction. This procedure would not be able to be performed without an Environmental consultant.  Resident: Dr. Basilio Cairo  Anesthesia: General  Complications: None  EBL: 100 mL  IVF:  2300 mL crystalloid  Specimens: 1. Prostate and seminal vesicles 2. Right pelvic lymph nodes 3. Left pelvic lymph nodes  Disposition of specimens: Pathology  Drains: 1. 20 Fr coude catheter 2. # 19 Blake pelvic drain  Indication: Anthony Banks is a 68 y.o. year old patient with clinically localized prostate cancer.  After a thorough review of the management options for treatment of prostate cancer, he elected to proceed with surgical therapy and the above procedure(s).  We have discussed the potential benefits and risks of the procedure, side effects of the proposed treatment, the likelihood of the patient achieving the goals of the procedure, and any potential problems that might occur during the procedure or recuperation. Informed consent has been obtained.  Description of procedure:  The patient was taken to the operating room and a general anesthetic was administered. He was given preoperative antibiotics, placed in the dorsal lithotomy position, and prepped and draped in the usual sterile fashion. Next a preoperative timeout was performed. A urethral catheter was placed into the bladder and a site was  selected near the umbilicus for placement of the camera port. This was placed using a standard open Hassan technique which allowed entry into the peritoneal cavity under direct vision and without difficulty. An 8 mm robotic port was placed and a pneumoperitoneum established. The camera was then used to inspect the abdomen and there was no evidence of any intra-abdominal injuries or other abnormalities. The remaining abdominal ports were then placed. 8 mm robotic ports were placed in the right lower quadrant, left lower quadrant, and far left lateral abdominal wall. A 5 mm port was placed in the right upper quadrant and a 12 mm port was placed in the right lateral abdominal wall for laparoscopic assistance. All ports were placed under direct vision without difficulty. The surgical cart was then docked.   Utilizing the cautery scissors, the bladder was reflected posteriorly allowing entry into the space of Retzius and identification of the endopelvic fascia and prostate. The periprostatic fat was then removed from the prostate allowing full exposure of the endopelvic fascia. The endopelvic fascia was then incised from the apex back to the base of the prostate bilaterally and the underlying levator muscle fibers were swept laterally off the prostate thereby isolating the dorsal venous complex. The dorsal vein was then stapled and divided with a 45 mm Flex Echelon stapler. Attention then turned to the bladder neck which was divided anteriorly thereby allowing entry into the bladder and exposure of the urethral catheter. The catheter balloon was deflated and the catheter was brought into the operative field and used to retract the prostate anteriorly. The posterior bladder neck was then examined and was divided allowing further dissection between the bladder and prostate posteriorly until the vasa deferentia and seminal vessels were identified. The vasa deferentia were  isolated, divided, and lifted anteriorly. The  seminal vesicles were dissected down to their tips with care to control the seminal vascular arterial blood supply. These structures were then lifted anteriorly and the space between Denonvillier's fascia and the anterior rectum was developed with a combination of sharp and blunt dissection. This isolated the vascular pedicles of the prostate.  The lateral prostatic fascia was then sharply incised allowing release of the neurovascular bundles bilaterally. The vascular pedicles of the prostate were then ligated with Weck clips between the prostate and neurovascular bundles and divided with sharp cold scissor dissection resulting in neurovascular bundle preservation. The neurovascular bundles were then separated off the apex of the prostate and urethra bilaterally.  The urethra was then sharply transected allowing the prostate specimen to be disarticulated. The pelvis was copiously irrigated and hemostasis was ensured. There was no evidence for rectal injury.  Attention then turned to the right pelvic sidewall. The fibrofatty tissue between the external iliac vein, confluence of the iliac vessels, hypogastric artery, and Cooper's ligament was dissected free from the pelvic sidewall with care to preserve the obturator nerve. Weck clips were used for lymphostasis and hemostasis. An identical procedure was performed on the contralateral side and the lymphatic packets were removed for permanent pathologic analysis.  Attention then turned to the urethral anastomosis. A 2-0 Vicryl slip knot was placed between Denonvillier's fascia, the posterior bladder neck, and the posterior urethra to reapproximate these structures. A double-armed 3-0 Monocryl suture was then used to perform a 360 running tension-free anastomosis between the bladder neck and urethra. The catheter that was used to identify the urethra was noted to have a suture attached to it and the anastomosis was partially opened and this stitch was cut.  A  new 3-0 monocryl suture was used to fix the portion of the anastomosis to create a water tight, and tension free anastomosis. A new urethral catheter was then placed into the bladder and irrigated. There were no blood clots within the bladder and the anastomosis appeared to be watertight. A #19 Blake drain was then brought through the left lateral 8 mm port site and positioned appropriately within the pelvis. It was secured to the skin with a nylon suture. The surgical cart was then undocked. The right lateral 12 mm port site was closed at the fascial level with a 0 Vicryl suture placed laparoscopically. All remaining ports were then removed under direct vision. The prostate specimen was removed intact within the Endopouch retrieval bag via the periumbilical camera port site. This fascial opening was closed with two running 0 Vicryl sutures. 0.25% Marcaine was then injected into all port sites and all incisions were reapproximated at the skin level with 4-0 Monocryl subcuticular sutures and Dermabond. The patient appeared to tolerate the procedure well and without complications. The patient was able to be extubated and transferred to the recovery unit in satisfactory condition.   [name] MD

## 2018-05-31 NOTE — Anesthesia Procedure Notes (Signed)
Procedure Name: Intubation Date/Time: 05/31/2018 11:11 AM Performed by: Niel Hummer, CRNA Pre-anesthesia Checklist: Patient being monitored, Suction available, Emergency Drugs available and Patient identified Patient Re-evaluated:Patient Re-evaluated prior to induction Oxygen Delivery Method: Circle system utilized Preoxygenation: Pre-oxygenation with 100% oxygen Induction Type: IV induction Laryngoscope Size: Mac and 4 Grade View: Grade I Tube type: Oral Tube size: 7.5 mm Number of attempts: 1 Airway Equipment and Method: Stylet Placement Confirmation: ETT inserted through vocal cords under direct vision,  positive ETCO2 and breath sounds checked- equal and bilateral Secured at: 22 cm Tube secured with: Tape Dental Injury: Teeth and Oropharynx as per pre-operative assessment

## 2018-05-31 NOTE — Transfer of Care (Signed)
Immediate Anesthesia Transfer of Care Note  Patient: Anthony Banks  Procedure(s) Performed: XI ROBOTIC ASSISTED LAPAROSCOPIC RADICAL PROSTATECTOMY LEVEL 2 (N/A ) LYMPHADENECTOMY, PELVIC (Bilateral )  Patient Location: PACU  Anesthesia Type:General  Level of Consciousness: awake  Airway & Oxygen Therapy: Patient Spontanous Breathing and Patient connected to face mask oxygen  Post-op Assessment: Report given to RN and Post -op Vital signs reviewed and stable  Post vital signs: Reviewed and stable  Last Vitals:  Vitals Value Taken Time  BP    Temp    Pulse 62 05/31/2018  2:44 PM  Resp 7 05/31/2018  2:44 PM  SpO2 99 % 05/31/2018  2:44 PM  Vitals shown include unvalidated device data.  Last Pain:  Vitals:   05/31/18 1007  TempSrc:   PainSc: 0-No pain      Patients Stated Pain Goal: 4 (07/86/75 4492)  Complications: No apparent anesthesia complications

## 2018-05-31 NOTE — Interval H&P Note (Signed)
History and Physical Interval Note:  05/31/2018 10:21 AM  Anthony Banks  has presented today for surgery, with the diagnosis of PROSTATE CANCER.  The various methods of treatment have been discussed with the patient and family. After consideration of risks, benefits and other options for treatment, the patient has consented to  Procedure(s): XI ROBOTIC ASSISTED LAPAROSCOPIC RADICAL PROSTATECTOMY LEVEL 2 (N/A) LYMPHADENECTOMY, PELVIC (Bilateral) as a surgical intervention.  The patient's history has been reviewed, patient examined, no change in status, stable for surgery.  I have reviewed the patient's chart and labs.  Questions were answered to the patient's satisfaction.     [employer]

## 2018-06-01 ENCOUNTER — Encounter (HOSPITAL_COMMUNITY): Payer: Self-pay | Admitting: Urology

## 2018-06-01 DIAGNOSIS — C61 Malignant neoplasm of prostate: Secondary | ICD-10-CM | POA: Diagnosis not present

## 2018-06-01 LAB — HEMOGLOBIN AND HEMATOCRIT, BLOOD
HCT: 38.9 % — ABNORMAL LOW (ref 39.0–52.0)
HCT: 40.3 % (ref 39.0–52.0)
Hemoglobin: 12.4 g/dL — ABNORMAL LOW (ref 13.0–17.0)
Hemoglobin: 13.2 g/dL (ref 13.0–17.0)

## 2018-06-01 MED ORDER — BISACODYL 10 MG RE SUPP
10.0000 mg | Freq: Once | RECTAL | Status: AC
  Administered 2018-06-01: 10 mg via RECTAL
  Filled 2018-06-01: qty 1

## 2018-06-01 NOTE — Discharge Summary (Signed)
  Date of admission: 05/31/2018  Date of discharge: 06/01/2018  Admission diagnosis: Prostate Cancer  Discharge diagnosis: Prostate Cancer  History and Physical: For full details, please see admission history and physical. Briefly, Anthony Banks is a 68 y.o. gentleman with localized prostate cancer.  After discussing management/treatment options, he elected to proceed with surgical treatment.  Hospital Course: Anthony Banks was taken to the operating room on 05/31/2018 and underwent a robotic assisted laparoscopic radical prostatectomy. He tolerated this procedure well and without complications. Postoperatively, he was able to be transferred to a regular hospital room following recovery from anesthesia.  He was able to begin ambulating the night of surgery. He remained hemodynamically stable overnight.  He had excellent urine output with appropriately minimal output from his pelvic drain and his pelvic drain was removed on POD #1.  He was transitioned to oral pain medication, tolerated a clear liquid diet, and had met all discharge criteria and was able to be discharged home later on POD#1.  Laboratory values:  Recent Labs    05/31/18 1510 06/01/18 0501 06/01/18 1128  HGB 14.8 12.4* 13.2  HCT 45.8 38.9* 40.3    Disposition: Home  Discharge instruction: He was instructed to be ambulatory but to refrain from heavy lifting, strenuous activity, or driving. He was instructed on urethral catheter care.  Discharge medications:   Allergies as of 06/01/2018      Reactions   Aspirin Hives, Other (See Comments)   Tight chest   Morphine Sulfate Anaphylaxis, Swelling   Swelling of the throat   Nsaids Hives, Other (See Comments)   Chest Pains      Medication List    STOP taking these medications   glucosamine-chondroitin 500-400 MG tablet     TAKE these medications   acetaminophen 325 MG tablet Commonly known as:  TYLENOL Take 325-650 mg by mouth every 6 (six) hours as needed for  moderate pain.   oxcarbazepine 600 MG tablet Commonly known as:  TRILEPTAL Take 1.5 tablets twice a day What changed:    how much to take  how to take this  when to take this  additional instructions   sulfamethoxazole-trimethoprim 800-160 MG tablet Commonly known as:  BACTRIM DS Take 1 tablet by mouth 2 (two) times daily. Start the day prior to foley removal appointment   traMADol 50 MG tablet Commonly known as:  Ultram Take 1-2 tablets (50-100 mg total) by mouth every 6 (six) hours as needed for moderate pain or severe pain.       Followup: He will followup in 1 week for catheter removal and to discuss his surgical pathology results.

## 2018-06-01 NOTE — Progress Notes (Signed)
Patient ID: Anthony Banks, male   DOB: 30-Apr-1950, 68 y.o.   MRN: [id number]  1 Day Post-Op Subjective: The patient is doing well.  No nausea or vomiting. Pain is adequately controlled.  Objective: Vital signs in last 24 hours: Temp:  [97.8 F (36.6 C)-98.9 F (37.2 C)] 98.4 F (36.9 C) (04/17 0423) Pulse Rate:  [60-68] 60 (04/17 0423) Resp:  [8-18] 16 (04/17 0423) BP: (100-145)/(67-98) 100/67 (04/17 0423) SpO2:  [94 %-100 %] 98 % (04/17 0423) Weight:  [225 kg] 112 kg (04/16 1007)  Intake/Output from previous day: 04/16 0701 - 04/17 0700 In: 6165.8 [P.O.:850; I.V.:4330.3; IV Piggyback:985.4] Out: 1235 [Urine:975; Drains:160; Blood:100] Intake/Output this shift: No intake/output data recorded.  Physical Exam:  General: Alert and oriented. CV: RRR Lungs: Clear bilaterally. GI: Soft, Nondistended. Incisions: Clean, dry, and intact Urine: Clear Extremities: Nontender, no erythema, no edema.  Lab Results: Recent Labs    05/31/18 1510 06/01/18 0501  HGB 14.8 12.4*  HCT 45.8 38.9*      Assessment/Plan: POD# 1 s/p robotic prostatectomy.  1) SL IVF but will recheck H/H 2) Ambulate, Incentive spirometry 3) Transition to oral pain medication (will keep on tylenol only considering anaphylaxis to morphine) 4) Dulcolax suppository 5) D/C pelvic drain 6) Plan for likely discharge later today   [name]. MD   LOS: 0 days   [name] 06/01/2018, 7:46 AM

## 2018-06-01 NOTE — Care Management Obs Status (Signed)
[facility] NOTIFICATION   Patient Details  Name: CHICO CAWOOD MRN: [id number] Date of Birth: 03-18-1950   Medicare Observation Status Notification Given:  Yes    MahabirJuliann Pulse, RN 06/01/2018, 1:08 PM

## 2018-06-01 NOTE — Progress Notes (Signed)
Provided pt with education on home catheter use and care of incision. Provided Pt with supplies to go home with for catheter care. Pt showed understanding.

## 2018-06-15 DIAGNOSIS — Z950 Presence of cardiac pacemaker: Secondary | ICD-10-CM | POA: Diagnosis not present

## 2018-06-15 DIAGNOSIS — R32 Unspecified urinary incontinence: Secondary | ICD-10-CM | POA: Diagnosis not present

## 2018-06-15 DIAGNOSIS — R569 Unspecified convulsions: Secondary | ICD-10-CM | POA: Diagnosis not present

## 2018-06-15 DIAGNOSIS — Z85828 Personal history of other malignant neoplasm of skin: Secondary | ICD-10-CM | POA: Diagnosis not present

## 2018-06-15 DIAGNOSIS — Z8546 Personal history of malignant neoplasm of prostate: Secondary | ICD-10-CM | POA: Diagnosis not present

## 2018-06-25 ENCOUNTER — Encounter

## 2018-06-25 ENCOUNTER — Ambulatory Visit: Payer: Medicare HMO | Admitting: Neurology

## 2018-06-27 DIAGNOSIS — N393 Stress incontinence (female) (male): Secondary | ICD-10-CM | POA: Diagnosis not present

## 2018-06-27 DIAGNOSIS — M62838 Other muscle spasm: Secondary | ICD-10-CM | POA: Diagnosis not present

## 2018-06-27 DIAGNOSIS — M6281 Muscle weakness (generalized): Secondary | ICD-10-CM | POA: Diagnosis not present

## 2018-07-10 ENCOUNTER — Ambulatory Visit (INDEPENDENT_AMBULATORY_CARE_PROVIDER_SITE_OTHER): Payer: Medicare HMO | Admitting: *Deleted

## 2018-07-10 DIAGNOSIS — R001 Bradycardia, unspecified: Secondary | ICD-10-CM | POA: Diagnosis not present

## 2018-07-10 DIAGNOSIS — I495 Sick sinus syndrome: Secondary | ICD-10-CM

## 2018-07-11 DIAGNOSIS — M62838 Other muscle spasm: Secondary | ICD-10-CM | POA: Diagnosis not present

## 2018-07-11 DIAGNOSIS — N393 Stress incontinence (female) (male): Secondary | ICD-10-CM | POA: Diagnosis not present

## 2018-07-11 DIAGNOSIS — M6281 Muscle weakness (generalized): Secondary | ICD-10-CM | POA: Diagnosis not present

## 2018-07-11 LAB — CUP PACEART REMOTE DEVICE CHECK
Date Time Interrogation Session: 20200527115604
Implantable Lead Implant Date: 20171016
Implantable Lead Implant Date: 20171016
Implantable Lead Location: 753859
Implantable Lead Location: 753860
Implantable Pulse Generator Implant Date: 20171016
Pulse Gen Model: 2272
Pulse Gen Serial Number: 7961269

## 2018-07-16 DIAGNOSIS — C61 Malignant neoplasm of prostate: Secondary | ICD-10-CM | POA: Diagnosis not present

## 2018-07-19 NOTE — Progress Notes (Signed)
Remote pacemaker transmission.   

## 2018-08-01 DIAGNOSIS — N393 Stress incontinence (female) (male): Secondary | ICD-10-CM | POA: Diagnosis not present

## 2018-08-01 DIAGNOSIS — M6281 Muscle weakness (generalized): Secondary | ICD-10-CM | POA: Diagnosis not present

## 2018-08-01 DIAGNOSIS — M62838 Other muscle spasm: Secondary | ICD-10-CM | POA: Diagnosis not present

## 2018-08-14 DIAGNOSIS — Z0000 Encounter for general adult medical examination without abnormal findings: Secondary | ICD-10-CM | POA: Diagnosis not present

## 2018-08-14 DIAGNOSIS — E7800 Pure hypercholesterolemia, unspecified: Secondary | ICD-10-CM | POA: Diagnosis not present

## 2018-08-14 DIAGNOSIS — Z1389 Encounter for screening for other disorder: Secondary | ICD-10-CM | POA: Diagnosis not present

## 2018-08-24 DIAGNOSIS — C61 Malignant neoplasm of prostate: Secondary | ICD-10-CM | POA: Diagnosis not present

## 2018-08-28 DIAGNOSIS — N393 Stress incontinence (female) (male): Secondary | ICD-10-CM | POA: Diagnosis not present

## 2018-08-28 DIAGNOSIS — M62838 Other muscle spasm: Secondary | ICD-10-CM | POA: Diagnosis not present

## 2018-08-28 DIAGNOSIS — M6281 Muscle weakness (generalized): Secondary | ICD-10-CM | POA: Diagnosis not present

## 2018-08-31 DIAGNOSIS — C61 Malignant neoplasm of prostate: Secondary | ICD-10-CM | POA: Diagnosis not present

## 2018-08-31 DIAGNOSIS — N5201 Erectile dysfunction due to arterial insufficiency: Secondary | ICD-10-CM | POA: Diagnosis not present

## 2018-08-31 DIAGNOSIS — N393 Stress incontinence (female) (male): Secondary | ICD-10-CM | POA: Diagnosis not present

## 2018-09-03 DIAGNOSIS — C44719 Basal cell carcinoma of skin of left lower limb, including hip: Secondary | ICD-10-CM | POA: Diagnosis not present

## 2018-09-03 DIAGNOSIS — D485 Neoplasm of uncertain behavior of skin: Secondary | ICD-10-CM | POA: Diagnosis not present

## 2018-09-03 DIAGNOSIS — Z85828 Personal history of other malignant neoplasm of skin: Secondary | ICD-10-CM | POA: Diagnosis not present

## 2018-09-03 DIAGNOSIS — D2272 Melanocytic nevi of left lower limb, including hip: Secondary | ICD-10-CM | POA: Diagnosis not present

## 2018-09-03 DIAGNOSIS — C44212 Basal cell carcinoma of skin of right ear and external auricular canal: Secondary | ICD-10-CM | POA: Diagnosis not present

## 2018-09-03 DIAGNOSIS — D225 Melanocytic nevi of trunk: Secondary | ICD-10-CM | POA: Diagnosis not present

## 2018-09-03 DIAGNOSIS — D2261 Melanocytic nevi of right upper limb, including shoulder: Secondary | ICD-10-CM | POA: Diagnosis not present

## 2018-09-03 DIAGNOSIS — D2271 Melanocytic nevi of right lower limb, including hip: Secondary | ICD-10-CM | POA: Diagnosis not present

## 2018-09-03 DIAGNOSIS — L821 Other seborrheic keratosis: Secondary | ICD-10-CM | POA: Diagnosis not present

## 2018-09-03 DIAGNOSIS — D2262 Melanocytic nevi of left upper limb, including shoulder: Secondary | ICD-10-CM | POA: Diagnosis not present

## 2018-09-03 DIAGNOSIS — D1801 Hemangioma of skin and subcutaneous tissue: Secondary | ICD-10-CM | POA: Diagnosis not present

## 2018-09-03 DIAGNOSIS — D2239 Melanocytic nevi of other parts of face: Secondary | ICD-10-CM | POA: Diagnosis not present

## 2018-09-12 DIAGNOSIS — M62838 Other muscle spasm: Secondary | ICD-10-CM | POA: Diagnosis not present

## 2018-09-12 DIAGNOSIS — N393 Stress incontinence (female) (male): Secondary | ICD-10-CM | POA: Diagnosis not present

## 2018-09-12 DIAGNOSIS — M6281 Muscle weakness (generalized): Secondary | ICD-10-CM | POA: Diagnosis not present

## 2018-10-10 ENCOUNTER — Ambulatory Visit (INDEPENDENT_AMBULATORY_CARE_PROVIDER_SITE_OTHER): Payer: Medicare HMO | Admitting: *Deleted

## 2018-10-10 DIAGNOSIS — I495 Sick sinus syndrome: Secondary | ICD-10-CM | POA: Diagnosis not present

## 2018-10-14 LAB — CUP PACEART REMOTE DEVICE CHECK
Battery Remaining Longevity: 113 mo
Battery Remaining Percentage: 95.5 %
Battery Voltage: 3.01 V
Brady Statistic AP VP Percent: 13 %
Brady Statistic AP VS Percent: 45 %
Brady Statistic AS VP Percent: 1 %
Brady Statistic AS VS Percent: 41 %
Brady Statistic RA Percent Paced: 57 %
Brady Statistic RV Percent Paced: 13 %
Date Time Interrogation Session: 20200830031828
Implantable Lead Implant Date: 20171016
Implantable Lead Implant Date: 20171016
Implantable Lead Location: 753859
Implantable Lead Location: 753860
Implantable Pulse Generator Implant Date: 20171016
Lead Channel Impedance Value: 450 Ohm
Lead Channel Impedance Value: 490 Ohm
Lead Channel Pacing Threshold Amplitude: 0.5 V
Lead Channel Pacing Threshold Amplitude: 1.25 V
Lead Channel Pacing Threshold Pulse Width: 0.5 ms
Lead Channel Pacing Threshold Pulse Width: 0.5 ms
Lead Channel Sensing Intrinsic Amplitude: 12 mV
Lead Channel Sensing Intrinsic Amplitude: 5 mV
Lead Channel Setting Pacing Amplitude: 2 V
Lead Channel Setting Pacing Amplitude: 2.5 V
Lead Channel Setting Pacing Pulse Width: 0.5 ms
Lead Channel Setting Sensing Sensitivity: 2 mV
Pulse Gen Model: 2272
Pulse Gen Serial Number: 7961269

## 2018-10-15 DIAGNOSIS — H5213 Myopia, bilateral: Secondary | ICD-10-CM | POA: Diagnosis not present

## 2018-10-15 DIAGNOSIS — H52223 Regular astigmatism, bilateral: Secondary | ICD-10-CM | POA: Diagnosis not present

## 2018-10-15 DIAGNOSIS — H524 Presbyopia: Secondary | ICD-10-CM | POA: Diagnosis not present

## 2018-10-15 DIAGNOSIS — Z0100 Encounter for examination of eyes and vision without abnormal findings: Secondary | ICD-10-CM | POA: Diagnosis not present

## 2018-10-15 DIAGNOSIS — H43811 Vitreous degeneration, right eye: Secondary | ICD-10-CM | POA: Diagnosis not present

## 2018-10-15 DIAGNOSIS — H2513 Age-related nuclear cataract, bilateral: Secondary | ICD-10-CM | POA: Diagnosis not present

## 2018-10-18 ENCOUNTER — Encounter: Payer: Self-pay | Admitting: Cardiology

## 2018-10-18 NOTE — Progress Notes (Signed)
Remote pacemaker transmission.   

## 2018-10-30 DIAGNOSIS — M62838 Other muscle spasm: Secondary | ICD-10-CM | POA: Diagnosis not present

## 2018-10-30 DIAGNOSIS — M6281 Muscle weakness (generalized): Secondary | ICD-10-CM | POA: Diagnosis not present

## 2018-10-30 DIAGNOSIS — N393 Stress incontinence (female) (male): Secondary | ICD-10-CM | POA: Diagnosis not present

## 2018-11-11 IMAGING — MR MR HEAD W/O CM
9 of 12 series · 34 of 48 positions shown · non-contrast
Comparison: CT HEAD March 01, 2016 and March 06, 2017 and
March 08, 2017

CLINICAL DATA: New onset seizures, status post subdural hematoma
evacuation January 30, 2017 and March 07, 2017.

EXAM:
MRI HEAD WITHOUT CONTRAST
TECHNIQUE: Multiplanar, multiecho pulse sequences of the brain and surrounding
structures were obtained without intravenous contrast.

[Series 3: DWI · axial · 3.0mm · 1.09mm/px · z∈[-41,+107]mm · 9 of 102 slices shown (1 of 4)]
[im 1/102]
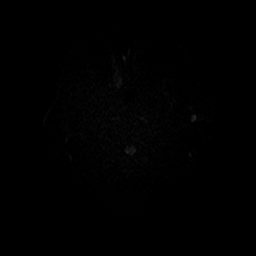
[im 13/102]
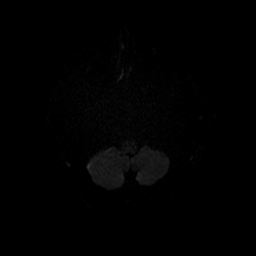
[im 26/102]
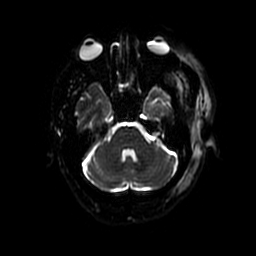
[im 38/102]
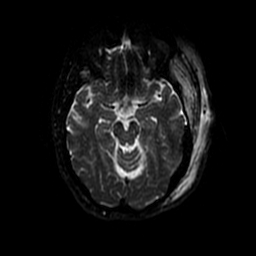
[im 51/102]
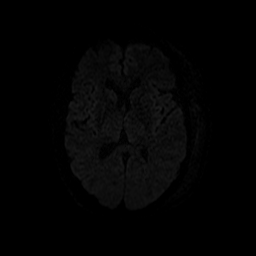
[im 64/102]
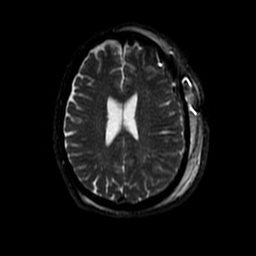
[im 76/102]
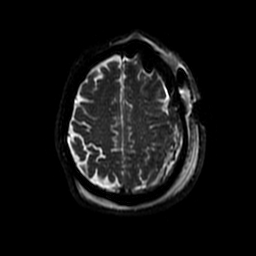
[im 89/102]
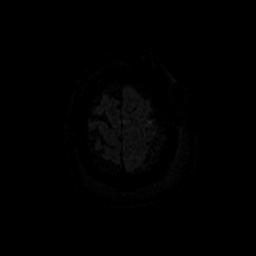
[im 102/102]
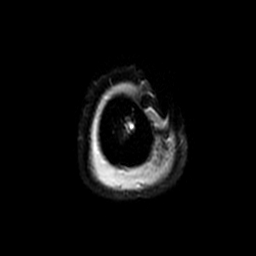

[Series 4: DWI · coronal · 5.0mm · 1.09mm/px · 7 of 76 slices shown (2 of 4)]
[im 1/76]
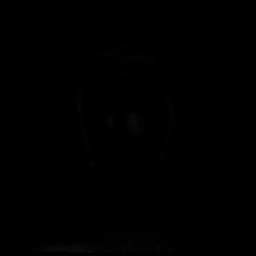
[im 13/76]
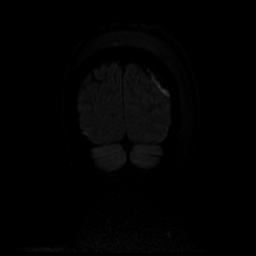
[im 26/76]
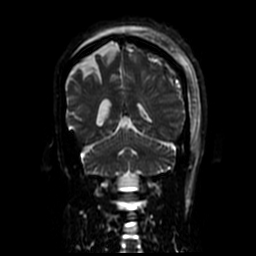
[im 38/76]
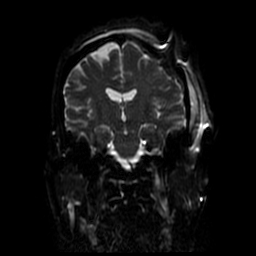
[im 51/76]
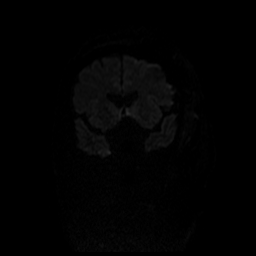
[im 63/76]
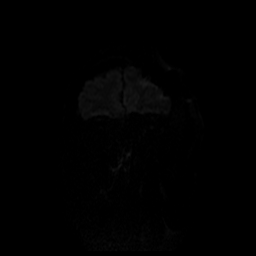
[im 76/76]
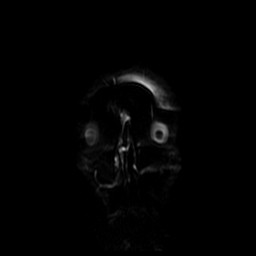

[Series 5: T2 · axial · 5.0mm · 0.47mm/px · z∈[-65,+112]mm · 2 of 31 slices shown (1 of 3)]
[im 1/31]
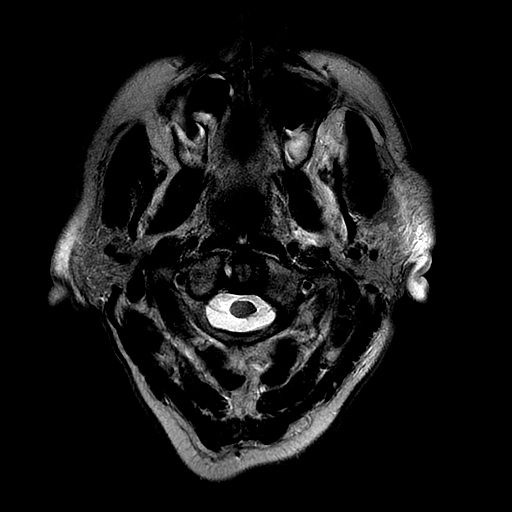
[im 31/31]
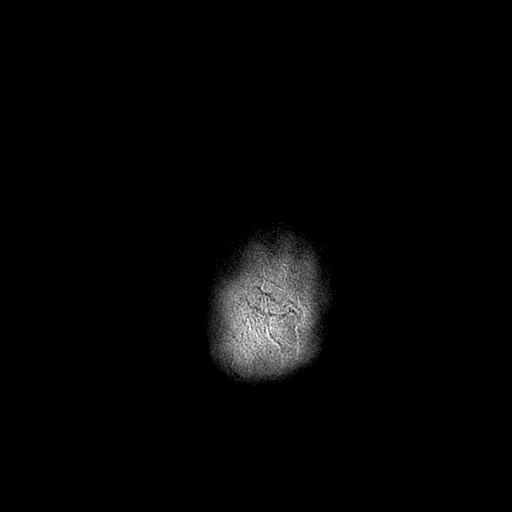

[Series 6: FLAIR · axial · 5.0mm · 0.47mm/px · z∈[-65,+112]mm · 2 of 31 slices shown]
[im 1/31]
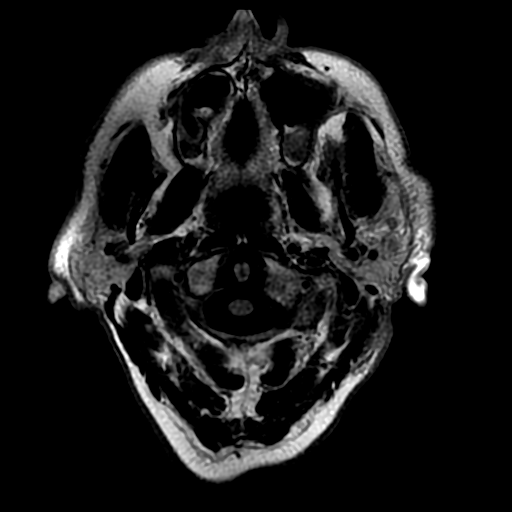
[im 31/31]
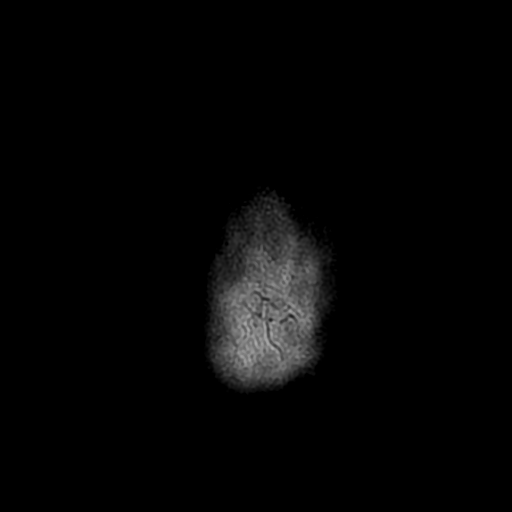

[Series 7: T1 · sagittal · 5.0mm · 0.47mm/px · 2 of 30 slices shown]
[im 1/30]
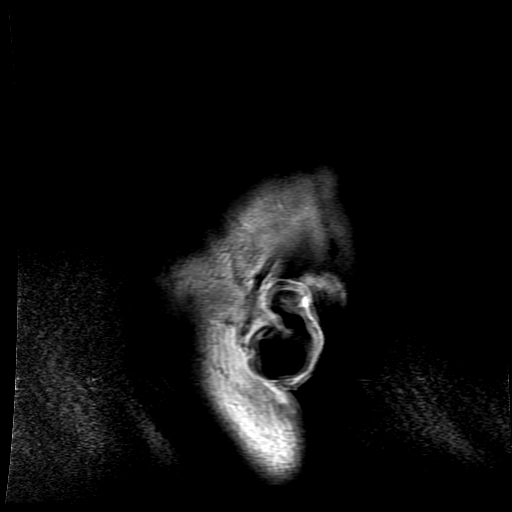
[im 30/30]
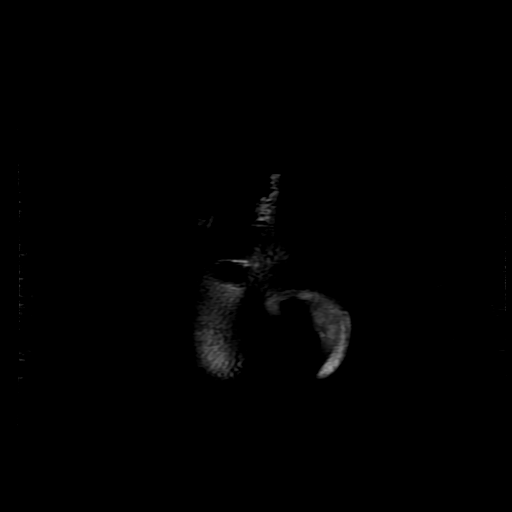

[Series 10: T2 · coronal · 5.0mm · 0.47mm/px · 3 of 32 slices shown (2 of 3)]
[im 1/32]
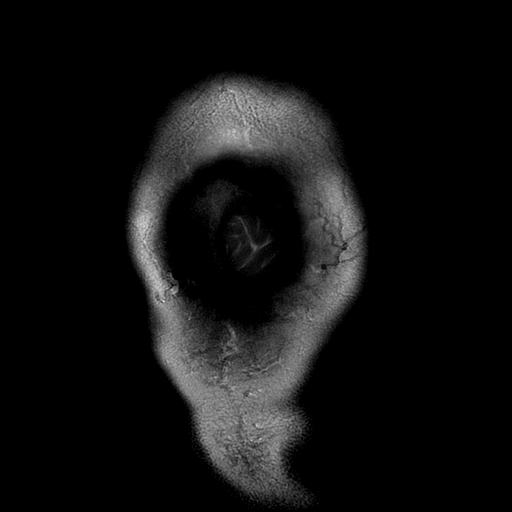
[im 16/32]
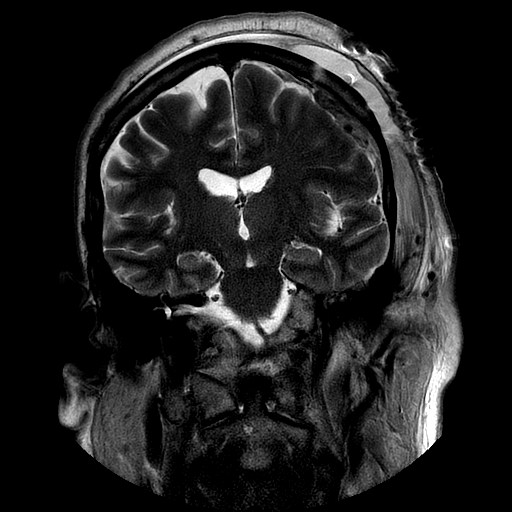
[im 32/32]
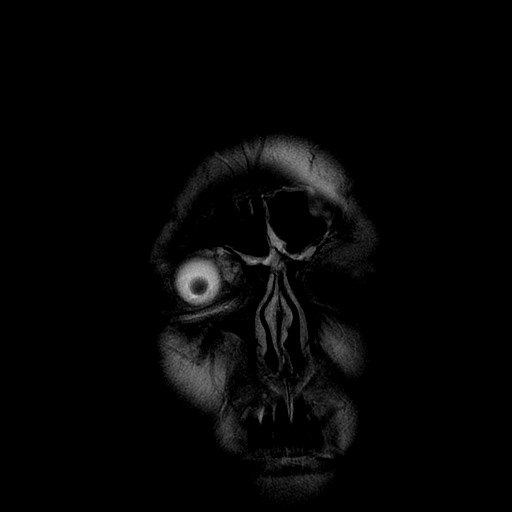

[Series 11: T2 · coronal · 3.0mm · 0.39mm/px · 2 of 24 slices shown (3 of 3)]
[im 1/24]
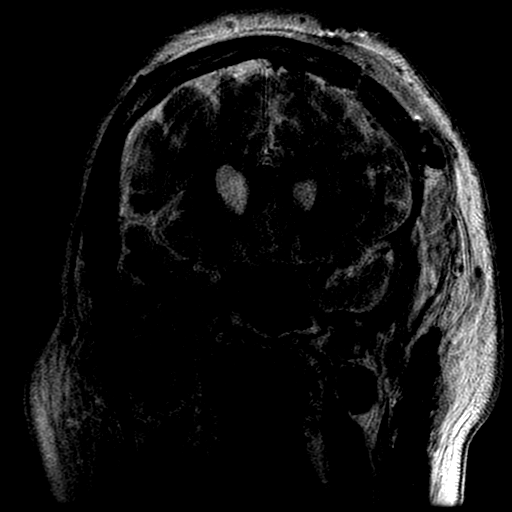
[im 24/24]
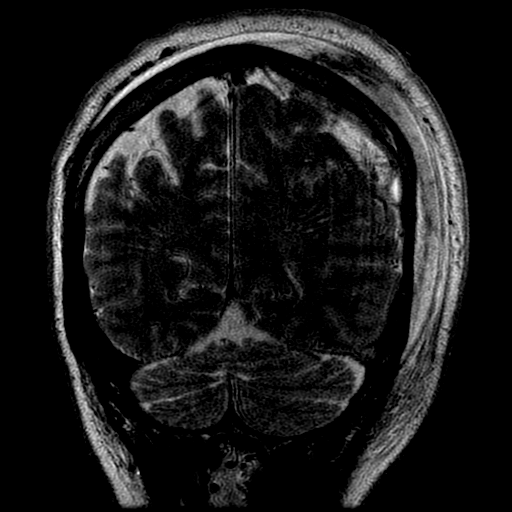

[Series 300: DWI · axial · 3.0mm · 1.09mm/px · z∈[-41,+107]mm · 4 of 51 slices shown (3 of 4)]
[im 1/51]
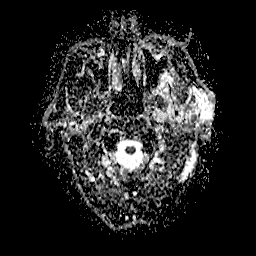
[im 17/51]
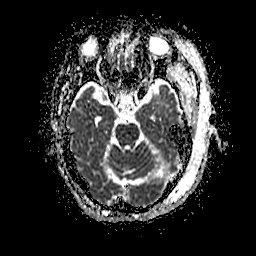
[im 34/51]
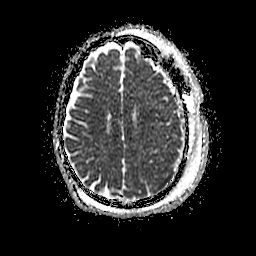
[im 51/51]
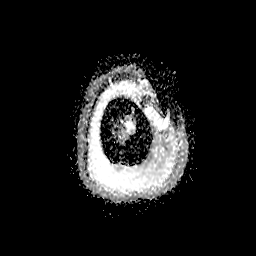

[Series 400: DWI · coronal · 5.0mm · 1.09mm/px · 3 of 38 slices shown (4 of 4)]
[im 1/38]
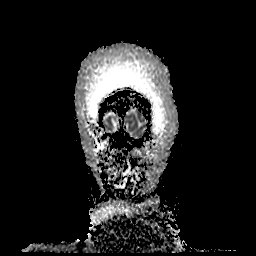
[im 19/38]
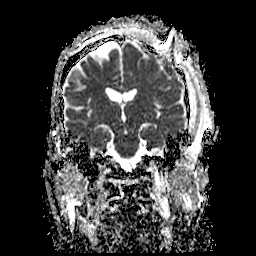
[im 38/38]
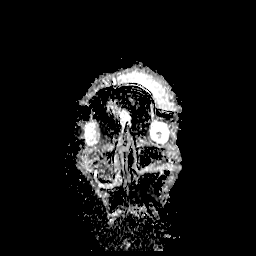

[34 of 48 positions shown; findings below may reference images not displayed]

FINDINGS: INTRACRANIAL CONTENTS: No reduced diffusion to suggest acute
ischemia or typical infection. Spurious reduced diffusion associated
with susceptibility artifact residual LEFT frontoparietal subdural
hematoma measuring to 10 mm in transaxial dimension stable from
today's CT. Dependent T1 shortening within subdural collection
consistent with subacute blood products, anterior low signal
consistent with extra-axial pneumocephalus. Similar regional mass
effect LEFT frontoparietal convexity without midline shift. A few
scattered subcentimeter supratentorial white matter FLAIR T2
hyperintensities compatible with mild chronic small vessel ischemic
disease, less than expected for age. No parenchymal brain volume
loss for age. No hydrocephalus. Basal cisterns are patent. Motion
degraded thin slice coronal T2, relatively symmetric size,
morphology and signal of the hippocampi.

VASCULAR: Normal major intracranial vascular flow voids present at
skull base.

SKULL AND UPPER CERVICAL SPINE: No abnormal sellar expansion. No
suspicious calvarial bone marrow signal. Craniocervical junction
maintained. LEFT scalp swelling, fluid-fluid levels with skin
staples and underlying re-do craniotomy.

SINUSES/ORBITS: Lobulated paranasal sinus mucosal thickening without
air-fluid levels. Trace LEFT mastoid effusion. Included ocular
globes and orbital contents are non-suspicious.

OTHER: None.
IMPRESSION: 1. Status post recent re-do LEFT frontal craniotomy and subdural
evacuation with 10 mm residual collection. No midline shift.
2. Otherwise normal noncontrast MRI of the head.

## 2018-11-29 DIAGNOSIS — Z23 Encounter for immunization: Secondary | ICD-10-CM | POA: Diagnosis not present

## 2018-12-03 DIAGNOSIS — C61 Malignant neoplasm of prostate: Secondary | ICD-10-CM | POA: Diagnosis not present

## 2019-01-08 LAB — CUP PACEART REMOTE DEVICE CHECK
Battery Remaining Longevity: 113 mo
Battery Remaining Percentage: 95.5 %
Battery Voltage: 2.99 V
Brady Statistic AP VP Percent: 14 %
Brady Statistic AP VS Percent: 45 %
Brady Statistic AS VP Percent: 1 %
Brady Statistic AS VS Percent: 41 %
Brady Statistic RA Percent Paced: 58 %
Brady Statistic RV Percent Paced: 14 %
Date Time Interrogation Session: 20201124045608
Implantable Lead Implant Date: 20171016
Implantable Lead Implant Date: 20171016
Implantable Lead Location: 753859
Implantable Lead Location: 753860
Implantable Pulse Generator Implant Date: 20171016
Lead Channel Impedance Value: 450 Ohm
Lead Channel Impedance Value: 490 Ohm
Lead Channel Pacing Threshold Amplitude: 0.5 V
Lead Channel Pacing Threshold Amplitude: 1.25 V
Lead Channel Pacing Threshold Pulse Width: 0.5 ms
Lead Channel Pacing Threshold Pulse Width: 0.5 ms
Lead Channel Sensing Intrinsic Amplitude: 10.8 mV
Lead Channel Sensing Intrinsic Amplitude: 5 mV
Lead Channel Setting Pacing Amplitude: 2 V
Lead Channel Setting Pacing Amplitude: 2.5 V
Lead Channel Setting Pacing Pulse Width: 0.5 ms
Lead Channel Setting Sensing Sensitivity: 2 mV
Pulse Gen Model: 2272
Pulse Gen Serial Number: 7961269

## 2019-01-09 ENCOUNTER — Ambulatory Visit (INDEPENDENT_AMBULATORY_CARE_PROVIDER_SITE_OTHER): Payer: Medicare HMO | Admitting: *Deleted

## 2019-01-09 DIAGNOSIS — R001 Bradycardia, unspecified: Secondary | ICD-10-CM | POA: Diagnosis not present

## 2019-02-05 NOTE — Progress Notes (Signed)
PPM remote 

## 2019-03-05 ENCOUNTER — Ambulatory Visit: Payer: Medicare Other | Attending: Internal Medicine

## 2019-03-05 DIAGNOSIS — Z23 Encounter for immunization: Secondary | ICD-10-CM | POA: Insufficient documentation

## 2019-03-05 NOTE — Progress Notes (Signed)
   Covid-19 Vaccination [facility]  Name:  Anthony Banks    MRN: [id number] DOB: Jan 18, 1951  03/05/2019  [name] was observed post Covid-19 immunization for 15 minutes without incidence. He was provided with Vaccine Information Sheet and instruction to access the V-Safe system.   [name] was instructed to call 911 with any severe reactions post vaccine: Marland Kitchen Difficulty breathing  . Swelling of your face and throat  . A fast heartbeat  . A bad rash all over your body  . Dizziness and weakness    Immunizations Administered    Name Date Dose VIS Date Route   Pfizer COVID-19 Vaccine 03/05/2019  3:46 PM 0.3 mL 01/25/2019 Intramuscular   Manufacturer: [place]   Lot: [id number]   [place]: [id number]

## 2019-03-11 DIAGNOSIS — L821 Other seborrheic keratosis: Secondary | ICD-10-CM | POA: Diagnosis not present

## 2019-03-11 DIAGNOSIS — D485 Neoplasm of uncertain behavior of skin: Secondary | ICD-10-CM | POA: Diagnosis not present

## 2019-03-11 DIAGNOSIS — C44519 Basal cell carcinoma of skin of other part of trunk: Secondary | ICD-10-CM | POA: Diagnosis not present

## 2019-03-11 DIAGNOSIS — C61 Malignant neoplasm of prostate: Secondary | ICD-10-CM | POA: Diagnosis not present

## 2019-03-11 DIAGNOSIS — Z85828 Personal history of other malignant neoplasm of skin: Secondary | ICD-10-CM | POA: Diagnosis not present

## 2019-03-11 DIAGNOSIS — D1801 Hemangioma of skin and subcutaneous tissue: Secondary | ICD-10-CM | POA: Diagnosis not present

## 2019-03-11 DIAGNOSIS — D225 Melanocytic nevi of trunk: Secondary | ICD-10-CM | POA: Diagnosis not present

## 2019-03-11 DIAGNOSIS — D2272 Melanocytic nevi of left lower limb, including hip: Secondary | ICD-10-CM | POA: Diagnosis not present

## 2019-03-11 DIAGNOSIS — L570 Actinic keratosis: Secondary | ICD-10-CM | POA: Diagnosis not present

## 2019-03-20 ENCOUNTER — Telehealth (INDEPENDENT_AMBULATORY_CARE_PROVIDER_SITE_OTHER): Payer: Medicare HMO | Admitting: Neurology

## 2019-03-20 ENCOUNTER — Other Ambulatory Visit: Payer: Self-pay

## 2019-03-20 ENCOUNTER — Encounter: Payer: Self-pay | Admitting: Neurology

## 2019-03-20 DIAGNOSIS — G40119 Localization-related (focal) (partial) symptomatic epilepsy and epileptic syndromes with simple partial seizures, intractable, without status epilepticus: Secondary | ICD-10-CM

## 2019-03-20 MED ORDER — OXCARBAZEPINE 600 MG PO TABS: ORAL_TABLET | ORAL | 3 refills | Status: DC
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
## 2019-03-27 ENCOUNTER — Ambulatory Visit: Payer: Medicare HMO | Attending: Internal Medicine

## 2019-03-27 DIAGNOSIS — C61 Malignant neoplasm of prostate: Secondary | ICD-10-CM | POA: Diagnosis not present

## 2019-03-27 DIAGNOSIS — N5201 Erectile dysfunction due to arterial insufficiency: Secondary | ICD-10-CM | POA: Diagnosis not present

## 2019-03-27 DIAGNOSIS — Z23 Encounter for immunization: Secondary | ICD-10-CM | POA: Insufficient documentation

## 2019-03-27 DIAGNOSIS — N393 Stress incontinence (female) (male): Secondary | ICD-10-CM | POA: Diagnosis not present

## 2019-03-27 NOTE — Progress Notes (Signed)
   Covid-19 Vaccination Clinic  Name:  Anthony Banks    MRN: [id number] DOB: 1950/09/05  03/27/2019  Mr. Anthony Banks was observed post Covid-19 immunization for 15 minutes without incidence. He was provided with Vaccine Information Sheet and instruction to access the V-Safe system.   Mr. Anthony Banks was instructed to call 911 with any severe reactions post vaccine: Marland Kitchen Difficulty breathing  . Swelling of your face and throat  . A fast heartbeat  . A bad rash all over your body  . Dizziness and weakness    Immunizations Administered    Name Date Dose VIS Date Route   Pfizer COVID-19 Vaccine 03/27/2019  8:24 AM 0.3 mL 01/25/2019 Intramuscular   Manufacturer: [place]   Lot: [id number]   [place]: [id number]

## 2019-04-10 ENCOUNTER — Ambulatory Visit (INDEPENDENT_AMBULATORY_CARE_PROVIDER_SITE_OTHER): Payer: Medicare HMO | Admitting: *Deleted

## 2019-04-10 DIAGNOSIS — R001 Bradycardia, unspecified: Secondary | ICD-10-CM

## 2019-04-13 LAB — CUP PACEART REMOTE DEVICE CHECK
Battery Remaining Longevity: 112 mo
Battery Remaining Percentage: 95.5 %
Battery Voltage: 3.01 V
Brady Statistic AP VP Percent: 15 %
Brady Statistic AP VS Percent: 44 %
Brady Statistic AS VP Percent: 1 %
Brady Statistic AS VS Percent: 41 %
Brady Statistic RA Percent Paced: 58 %
Brady Statistic RV Percent Paced: 15 %
Date Time Interrogation Session: 20210226233749
Implantable Lead Implant Date: 20171016
Implantable Lead Implant Date: 20171016
Implantable Lead Location: 753859
Implantable Lead Location: 753860
Implantable Pulse Generator Implant Date: 20171016
Lead Channel Impedance Value: 450 Ohm
Lead Channel Impedance Value: 460 Ohm
Lead Channel Pacing Threshold Amplitude: 0.5 V
Lead Channel Pacing Threshold Amplitude: 1.25 V
Lead Channel Pacing Threshold Pulse Width: 0.5 ms
Lead Channel Pacing Threshold Pulse Width: 0.5 ms
Lead Channel Sensing Intrinsic Amplitude: 10.4 mV
Lead Channel Sensing Intrinsic Amplitude: 5 mV
Lead Channel Setting Pacing Amplitude: 2 V
Lead Channel Setting Pacing Amplitude: 2.5 V
Lead Channel Setting Pacing Pulse Width: 0.5 ms
Lead Channel Setting Sensing Sensitivity: 2 mV
Pulse Gen Model: 2272
Pulse Gen Serial Number: 7961269

## 2019-06-19 DIAGNOSIS — H25813 Combined forms of age-related cataract, bilateral: Secondary | ICD-10-CM | POA: Diagnosis not present

## 2019-06-19 DIAGNOSIS — H2513 Age-related nuclear cataract, bilateral: Secondary | ICD-10-CM | POA: Diagnosis not present

## 2019-07-01 ENCOUNTER — Encounter (INDEPENDENT_AMBULATORY_CARE_PROVIDER_SITE_OTHER): Payer: Self-pay | Admitting: Otolaryngology

## 2019-07-01 ENCOUNTER — Other Ambulatory Visit: Payer: Self-pay

## 2019-07-01 ENCOUNTER — Ambulatory Visit (INDEPENDENT_AMBULATORY_CARE_PROVIDER_SITE_OTHER): Payer: Medicare HMO | Admitting: Otolaryngology

## 2019-07-01 VITALS — Temp 97.7°F

## 2019-07-01 DIAGNOSIS — H25811 Combined forms of age-related cataract, right eye: Secondary | ICD-10-CM | POA: Diagnosis not present

## 2019-07-01 DIAGNOSIS — H6123 Impacted cerumen, bilateral: Secondary | ICD-10-CM

## 2019-07-01 DIAGNOSIS — H43393 Other vitreous opacities, bilateral: Secondary | ICD-10-CM | POA: Diagnosis not present

## 2019-07-01 DIAGNOSIS — H25812 Combined forms of age-related cataract, left eye: Secondary | ICD-10-CM | POA: Diagnosis not present

## 2019-07-01 DIAGNOSIS — Z01818 Encounter for other preprocedural examination: Secondary | ICD-10-CM | POA: Diagnosis not present

## 2019-07-01 NOTE — Progress Notes (Signed)
HPI: Anthony Banks is a 69 y.o. male who presents for evaluation of ringing in his ears right side worse than left.  He also has blockage of his hearing.  He has used Q-tips..  Past Medical History:  Diagnosis Date  . AICD (automatic cardioverter/defibrillator) present 2017   [facility]  . Borderline hyperlipidemia   . Cancer ([place])    basal face Dr [name]  . Lumbar degenerative disc disease   . Nummular eczema   . Overweight   . Prostate cancer ([place])   . Seizure ([place]) 02/2017  . Subdural hematoma ([place]) dec17 2018, jan 2019  . Syncope    Past Surgical History:  Procedure Laterality Date  . basal/squamous cell face    . bilateral blepharoplasy    . COLONOSCOPY     polyps remove  . COLONOSCOPY WITH PROPOFOL N/A 11/24/2014   Procedure: COLONOSCOPY WITH PROPOFOL;  Surgeon: Garlan Fair, MD;  Location: [facility];  Service: Endoscopy;  Laterality: N/A;  . CRANIOTOMY N/A 01/30/2017   Procedure: CRANIOTOMY HEMATOMA EVACUATION SUBDURAL;  Surgeon: Consuella Lose, MD;  Location: [facility];  Service: Neurosurgery;  Laterality: N/A;  CRANIOTOMY HEMATOMA EVACUATION SUBDURAL  . CRANIOTOMY Left 03/07/2017   Procedure: CRANIOTOMY HEMATOMA EVACUATION SUBDURAL;  Surgeon: Consuella Lose, MD;  Location: [facility];  Service: Neurosurgery;  Laterality: Left;  . EP IMPLANTABLE DEVICE N/A 11/30/2015   Procedure: Pacemaker Implant;  Surgeon: Evans Lance, MD;  Location: [facility];  Service: Cardiovascular;  Laterality: N/A;  . L3 Gill procedure with L3 PLIF with interbody implant L3-4 posterlateral asthrodesis and posterior instrumentation of bone graft    . LYMPHADENECTOMY Bilateral 05/31/2018   Procedure: LYMPHADENECTOMY, PELVIC;  Surgeon: Raynelle Bring, MD;  Location: [facility];  Service: Urology;  Laterality: Bilateral;  . nasal polyps    . NASAL SEPTUM SURGERY    . ROBOT ASSISTED LAPAROSCOPIC RADICAL PROSTATECTOMY N/A 05/31/2018   Procedure: XI ROBOTIC ASSISTED LAPAROSCOPIC RADICAL  PROSTATECTOMY LEVEL 2;  Surgeon: Raynelle Bring, MD;  Location: [facility];  Service: Urology;  Laterality: N/A;  . VASECTOMY     Social History   Socioeconomic History  . Marital status: Married    Spouse name: Not on file  . Number of children: Not on file  . Years of education: Not on file  . Highest education level: Not on file  Occupational History  . Not on file  Tobacco Use  . Smoking status: Never Smoker  . Smokeless tobacco: Never Used  Substance and Sexual Activity  . Alcohol use: Yes  . Drug use: No  . Sexual activity: Not on file  Other Topics Concern  . Not on file  Social History Narrative   Tobacco Use: Cigarettes: Never Smoked   Non-Smoker for Personal reasons   Alcohol: 1-2 Per week, beer   Exercise: Elliptical 5X a week, wts, body wt   Occupation: Quarry manager   Marital Status: Married    Lives in [place] lives in 2 story home with his wife   Has 2 adult children   Forensic psychologist   Retired Press photographer    Social Determinants of Radio broadcast assistant Strain:   . Difficulty of Paying Living Expenses:   Food Insecurity:   . Worried About Charity fundraiser in the Last Year:   . Arboriculturist in the Last Year:   Transportation Needs:   .  Lack of Transportation (Medical):   Marland Kitchen Lack of Transportation (Non-Medical):   Physical Activity:   . Days of Exercise per Week:   . Minutes of Exercise per Session:   Stress:   . Feeling of Stress :   Social Connections:   . Frequency of Communication with Friends and Family:   . Frequency of Social Gatherings with Friends and Family:   . Attends Religious Services:   . Active Member of Clubs or Organizations:   . Attends Archivist Meetings:   Marland Kitchen Marital Status:    Family History  Problem Relation Age of Onset  . Asthma Mother   . Asthma Sister   . Cancer Sister        pancreatic   Allergies  Allergen Reactions  . Aspirin Hives and Other (See  Comments)    Tight chest  . Morphine Sulfate Anaphylaxis and Swelling    Swelling of the throat  . Nsaids Hives and Other (See Comments)    Chest Pains   Prior to Admission medications   Medication Sig Start Date End Date Taking? Authorizing Provider  acetaminophen (TYLENOL) 325 MG tablet Take 325-650 mg by mouth every 6 (six) hours as needed for moderate pain.    Yes [provider]  oxcarbazepine (TRILEPTAL) 600 MG tablet Take 1.5 tablets twice a day 03/20/19  Yes Cameron Sprang, MD     Positive ROS: Otherwise negative  All other systems have been reviewed and were otherwise negative with the exception of those mentioned in the HPI and as above.  Physical Exam: Constitutional: Alert, well-appearing, no acute distress Ears: External ears without lesions or tenderness. Ear canals reveal a large amount of packed wax in both ear canals right side worse than left.  This was cleaned with curette forceps and suction.  TMs were clear bilaterally.  After cleaning the ear canals on tuning fork testing he had minimal hearing loss.. Nasal: External nose without lesions. Clear nasal passages Oral: Oropharynx clear. Neck: No palpable adenopathy or masses Respiratory: Breathing comfortably  Skin: No facial/neck lesions or rash noted.  Cerumen impaction removal  Date/Time: 07/01/2019 5:57 PM Performed by: Rozetta Nunnery, MD Authorized by: Rozetta Nunnery, MD   Consent:    Consent obtained:  Verbal   Consent given by:  Patient   Risks discussed:  Pain and bleeding Procedure details:    Location:  L ear and R ear   Procedure type: curette, suction and forceps   Post-procedure details:    Inspection:  TM intact and canal normal   Hearing quality:  Improved   Patient tolerance of procedure:  Tolerated well, no immediate complications Comments:     TMs are clear bilaterally.  Hearing was much better after cleaning the ears.    Assessment: Bilateral cerumen  impactions  Plan: Hearing was much better after cleaning the ears.  Cautioned him about avoiding Q-tip use. He will follow-up as needed.  Radene Journey, MD

## 2019-07-10 ENCOUNTER — Ambulatory Visit (INDEPENDENT_AMBULATORY_CARE_PROVIDER_SITE_OTHER): Payer: Medicare HMO | Admitting: *Deleted

## 2019-07-10 DIAGNOSIS — R001 Bradycardia, unspecified: Secondary | ICD-10-CM

## 2019-07-10 LAB — CUP PACEART REMOTE DEVICE CHECK
Battery Remaining Longevity: 112 mo
Battery Remaining Percentage: 95.5 %
Battery Voltage: 2.99 V
Brady Statistic AP VP Percent: 16 %
Brady Statistic AP VS Percent: 44 %
Brady Statistic AS VP Percent: 1 %
Brady Statistic AS VS Percent: 40 %
Brady Statistic RA Percent Paced: 59 %
Brady Statistic RV Percent Paced: 16 %
Date Time Interrogation Session: 20210526020021
Implantable Lead Implant Date: 20171016
Implantable Lead Implant Date: 20171016
Implantable Lead Location: 753859
Implantable Lead Location: 753860
Implantable Pulse Generator Implant Date: 20171016
Lead Channel Impedance Value: 450 Ohm
Lead Channel Impedance Value: 460 Ohm
Lead Channel Pacing Threshold Amplitude: 0.5 V
Lead Channel Pacing Threshold Amplitude: 1.25 V
Lead Channel Pacing Threshold Pulse Width: 0.5 ms
Lead Channel Pacing Threshold Pulse Width: 0.5 ms
Lead Channel Sensing Intrinsic Amplitude: 5 mV
Lead Channel Sensing Intrinsic Amplitude: 9.7 mV
Lead Channel Setting Pacing Amplitude: 2 V
Lead Channel Setting Pacing Amplitude: 2.5 V
Lead Channel Setting Pacing Pulse Width: 0.5 ms
Lead Channel Setting Sensing Sensitivity: 2 mV
Pulse Gen Model: 2272
Pulse Gen Serial Number: 7961269

## 2019-07-11 DIAGNOSIS — H25811 Combined forms of age-related cataract, right eye: Secondary | ICD-10-CM | POA: Diagnosis not present

## 2019-07-11 DIAGNOSIS — H2511 Age-related nuclear cataract, right eye: Secondary | ICD-10-CM | POA: Diagnosis not present

## 2019-07-11 NOTE — Progress Notes (Signed)
Remote pacemaker transmission.   

## 2019-07-17 DIAGNOSIS — M545 Low back pain: Secondary | ICD-10-CM | POA: Diagnosis not present

## 2019-07-17 DIAGNOSIS — M25551 Pain in right hip: Secondary | ICD-10-CM | POA: Diagnosis not present

## 2019-07-17 DIAGNOSIS — R269 Unspecified abnormalities of gait and mobility: Secondary | ICD-10-CM | POA: Diagnosis not present

## 2019-07-17 DIAGNOSIS — M79604 Pain in right leg: Secondary | ICD-10-CM | POA: Diagnosis not present

## 2019-08-08 DIAGNOSIS — H25812 Combined forms of age-related cataract, left eye: Secondary | ICD-10-CM | POA: Diagnosis not present

## 2019-08-08 DIAGNOSIS — H52223 Regular astigmatism, bilateral: Secondary | ICD-10-CM | POA: Diagnosis not present

## 2019-08-08 DIAGNOSIS — H2512 Age-related nuclear cataract, left eye: Secondary | ICD-10-CM | POA: Diagnosis not present

## 2019-08-08 DIAGNOSIS — Z9841 Cataract extraction status, right eye: Secondary | ICD-10-CM | POA: Diagnosis not present

## 2019-08-08 DIAGNOSIS — H5211 Myopia, right eye: Secondary | ICD-10-CM | POA: Diagnosis not present

## 2019-08-08 DIAGNOSIS — H5202 Hypermetropia, left eye: Secondary | ICD-10-CM | POA: Diagnosis not present

## 2019-08-08 DIAGNOSIS — Z9842 Cataract extraction status, left eye: Secondary | ICD-10-CM | POA: Diagnosis not present

## 2019-08-08 DIAGNOSIS — H2513 Age-related nuclear cataract, bilateral: Secondary | ICD-10-CM | POA: Diagnosis not present

## 2019-09-05 DIAGNOSIS — E7800 Pure hypercholesterolemia, unspecified: Secondary | ICD-10-CM | POA: Diagnosis not present

## 2019-09-05 DIAGNOSIS — R569 Unspecified convulsions: Secondary | ICD-10-CM | POA: Diagnosis not present

## 2019-09-05 DIAGNOSIS — Z1389 Encounter for screening for other disorder: Secondary | ICD-10-CM | POA: Diagnosis not present

## 2019-09-05 DIAGNOSIS — Z0000 Encounter for general adult medical examination without abnormal findings: Secondary | ICD-10-CM | POA: Diagnosis not present

## 2019-09-05 DIAGNOSIS — Z23 Encounter for immunization: Secondary | ICD-10-CM | POA: Diagnosis not present

## 2019-09-05 DIAGNOSIS — Z8546 Personal history of malignant neoplasm of prostate: Secondary | ICD-10-CM | POA: Diagnosis not present

## 2019-09-13 DIAGNOSIS — B370 Candidal stomatitis: Secondary | ICD-10-CM | POA: Diagnosis not present

## 2019-10-01 DIAGNOSIS — N393 Stress incontinence (female) (male): Secondary | ICD-10-CM | POA: Diagnosis not present

## 2019-10-01 DIAGNOSIS — N5201 Erectile dysfunction due to arterial insufficiency: Secondary | ICD-10-CM | POA: Diagnosis not present

## 2019-10-01 DIAGNOSIS — C61 Malignant neoplasm of prostate: Secondary | ICD-10-CM | POA: Diagnosis not present

## 2019-10-07 DIAGNOSIS — Z0100 Encounter for examination of eyes and vision without abnormal findings: Secondary | ICD-10-CM | POA: Diagnosis not present

## 2019-10-09 ENCOUNTER — Ambulatory Visit (INDEPENDENT_AMBULATORY_CARE_PROVIDER_SITE_OTHER): Payer: Medicare HMO | Admitting: *Deleted

## 2019-10-09 DIAGNOSIS — R001 Bradycardia, unspecified: Secondary | ICD-10-CM

## 2019-10-10 LAB — CUP PACEART REMOTE DEVICE CHECK
Battery Remaining Longevity: 111 mo
Battery Remaining Percentage: 95.5 %
Battery Voltage: 2.99 V
Brady Statistic AP VP Percent: 17 %
Brady Statistic AP VS Percent: 44 %
Brady Statistic AS VP Percent: 1 %
Brady Statistic AS VS Percent: 39 %
Brady Statistic RA Percent Paced: 60 %
Brady Statistic RV Percent Paced: 17 %
Date Time Interrogation Session: 20210825035738
Implantable Lead Implant Date: 20171016
Implantable Lead Implant Date: 20171016
Implantable Lead Location: 753859
Implantable Lead Location: 753860
Implantable Pulse Generator Implant Date: 20171016
Lead Channel Impedance Value: 440 Ohm
Lead Channel Impedance Value: 460 Ohm
Lead Channel Pacing Threshold Amplitude: 0.5 V
Lead Channel Pacing Threshold Amplitude: 1.25 V
Lead Channel Pacing Threshold Pulse Width: 0.5 ms
Lead Channel Pacing Threshold Pulse Width: 0.5 ms
Lead Channel Sensing Intrinsic Amplitude: 10 mV
Lead Channel Sensing Intrinsic Amplitude: 5 mV
Lead Channel Setting Pacing Amplitude: 2 V
Lead Channel Setting Pacing Amplitude: 2.5 V
Lead Channel Setting Pacing Pulse Width: 0.5 ms
Lead Channel Setting Sensing Sensitivity: 2 mV
Pulse Gen Model: 2272
Pulse Gen Serial Number: 7961269

## 2019-10-14 NOTE — Progress Notes (Signed)
Remote pacemaker transmission.   

## 2019-11-11 DIAGNOSIS — H02831 Dermatochalasis of right upper eyelid: Secondary | ICD-10-CM | POA: Diagnosis not present

## 2019-11-11 DIAGNOSIS — H02834 Dermatochalasis of left upper eyelid: Secondary | ICD-10-CM | POA: Diagnosis not present

## 2019-12-12 DIAGNOSIS — H02831 Dermatochalasis of right upper eyelid: Secondary | ICD-10-CM | POA: Diagnosis not present

## 2019-12-12 DIAGNOSIS — H02834 Dermatochalasis of left upper eyelid: Secondary | ICD-10-CM | POA: Diagnosis not present

## 2019-12-30 DIAGNOSIS — C61 Malignant neoplasm of prostate: Secondary | ICD-10-CM | POA: Diagnosis not present

## 2020-01-03 DIAGNOSIS — Z23 Encounter for immunization: Secondary | ICD-10-CM | POA: Diagnosis not present

## 2020-01-08 ENCOUNTER — Ambulatory Visit (INDEPENDENT_AMBULATORY_CARE_PROVIDER_SITE_OTHER): Payer: Medicare HMO

## 2020-01-08 DIAGNOSIS — R001 Bradycardia, unspecified: Secondary | ICD-10-CM

## 2020-01-08 LAB — CUP PACEART REMOTE DEVICE CHECK
Battery Remaining Longevity: 110 mo
Battery Remaining Percentage: 95.5 %
Battery Voltage: 2.99 V
Brady Statistic AP VP Percent: 18 %
Brady Statistic AP VS Percent: 43 %
Brady Statistic AS VP Percent: 1 %
Brady Statistic AS VS Percent: 38 %
Brady Statistic RA Percent Paced: 61 %
Brady Statistic RV Percent Paced: 19 %
Date Time Interrogation Session: 20211124020032
Implantable Lead Implant Date: 20171016
Implantable Lead Implant Date: 20171016
Implantable Lead Location: 753859
Implantable Lead Location: 753860
Implantable Pulse Generator Implant Date: 20171016
Lead Channel Impedance Value: 440 Ohm
Lead Channel Impedance Value: 460 Ohm
Lead Channel Pacing Threshold Amplitude: 0.5 V
Lead Channel Pacing Threshold Amplitude: 1.25 V
Lead Channel Pacing Threshold Pulse Width: 0.5 ms
Lead Channel Pacing Threshold Pulse Width: 0.5 ms
Lead Channel Sensing Intrinsic Amplitude: 5 mV
Lead Channel Sensing Intrinsic Amplitude: 9.4 mV
Lead Channel Setting Pacing Amplitude: 2 V
Lead Channel Setting Pacing Amplitude: 2.5 V
Lead Channel Setting Pacing Pulse Width: 0.5 ms
Lead Channel Setting Sensing Sensitivity: 2 mV
Pulse Gen Model: 2272
Pulse Gen Serial Number: 7961269

## 2020-01-17 NOTE — Progress Notes (Signed)
Remote pacemaker transmission.   

## 2020-03-10 DIAGNOSIS — L821 Other seborrheic keratosis: Secondary | ICD-10-CM | POA: Diagnosis not present

## 2020-03-10 DIAGNOSIS — D1801 Hemangioma of skin and subcutaneous tissue: Secondary | ICD-10-CM | POA: Diagnosis not present

## 2020-03-10 DIAGNOSIS — L738 Other specified follicular disorders: Secondary | ICD-10-CM | POA: Diagnosis not present

## 2020-03-10 DIAGNOSIS — Z85828 Personal history of other malignant neoplasm of skin: Secondary | ICD-10-CM | POA: Diagnosis not present

## 2020-03-10 DIAGNOSIS — B354 Tinea corporis: Secondary | ICD-10-CM | POA: Diagnosis not present

## 2020-03-10 DIAGNOSIS — L570 Actinic keratosis: Secondary | ICD-10-CM | POA: Diagnosis not present

## 2020-03-10 DIAGNOSIS — D225 Melanocytic nevi of trunk: Secondary | ICD-10-CM | POA: Diagnosis not present

## 2020-03-18 DIAGNOSIS — N5201 Erectile dysfunction due to arterial insufficiency: Secondary | ICD-10-CM | POA: Diagnosis not present

## 2020-03-18 DIAGNOSIS — C61 Malignant neoplasm of prostate: Secondary | ICD-10-CM | POA: Diagnosis not present

## 2020-03-23 ENCOUNTER — Other Ambulatory Visit: Payer: Self-pay

## 2020-03-23 ENCOUNTER — Encounter: Payer: Self-pay | Admitting: Neurology

## 2020-03-23 ENCOUNTER — Ambulatory Visit: Payer: Medicare HMO | Admitting: Neurology

## 2020-03-23 VITALS — BP 141/91 | HR 98 | Ht 78.0 in | Wt 261.6 lb

## 2020-03-23 DIAGNOSIS — G40119 Localization-related (focal) (partial) symptomatic epilepsy and epileptic syndromes with simple partial seizures, intractable, without status epilepticus: Secondary | ICD-10-CM

## 2020-03-23 MED ORDER — OXCARBAZEPINE 600 MG PO TABS: ORAL_TABLET | ORAL | 3 refills | Status: DC

## 2020-03-23 NOTE — Patient Instructions (Signed)
Always a pleasure to see you. Continue Oxcarbazepine 600mg : take 1 and 1/2 tablets twice a day. Follow-up in 1 year, call for any changes.   Seizure Precautions: 1. If medication has been prescribed for you to prevent seizures, take it exactly as directed.  Do not stop taking the medicine without talking to your doctor first, even if you have not had a seizure in a long time.   2. Avoid activities in which a seizure would cause danger to yourself or to others.  Don't operate dangerous machinery, swim alone, or climb in high or dangerous places, such as on ladders, roofs, or girders.  Do not drive unless your doctor says you may.  3. If you have any warning that you may have a seizure, lay down in a safe place where you can't hurt yourself.    4.  No driving for 6 months from last seizure, as per [facility].   Please refer to the following link on the [place] website for more information: http://[email or web address]/[email or web address]/[email or web address]   5.  Maintain good sleep hygiene. Avoid alcohol.  6.  Contact your doctor if you have any problems that may be related to the medicine you are taking.  7.  Call 911 and bring the patient back to the ED if:        A.  The seizure lasts longer than 5 minutes.       B.  The patient doesn't awaken shortly after the seizure  C.  The patient has new problems such as difficulty seeing, speaking or moving  D.  The patient was injured during the seizure  E.  The patient has a temperature over 102 F (39C)  F.  The patient vomited and now is having trouble breathing
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
## 2020-04-08 ENCOUNTER — Ambulatory Visit (INDEPENDENT_AMBULATORY_CARE_PROVIDER_SITE_OTHER): Payer: Medicare HMO

## 2020-04-08 DIAGNOSIS — I495 Sick sinus syndrome: Secondary | ICD-10-CM

## 2020-04-13 LAB — CUP PACEART REMOTE DEVICE CHECK
Battery Remaining Longevity: 110 mo
Battery Remaining Percentage: 95.5 %
Battery Voltage: 2.99 V
Brady Statistic AP VP Percent: 19 %
Brady Statistic AP VS Percent: 43 %
Brady Statistic AS VP Percent: 1 %
Brady Statistic AS VS Percent: 38 %
Brady Statistic RA Percent Paced: 61 %
Brady Statistic RV Percent Paced: 20 %
Date Time Interrogation Session: 20220227222305
Implantable Lead Implant Date: 20171016
Implantable Lead Implant Date: 20171016
Implantable Lead Location: 753859
Implantable Lead Location: 753860
Implantable Pulse Generator Implant Date: 20171016
Lead Channel Impedance Value: 440 Ohm
Lead Channel Impedance Value: 460 Ohm
Lead Channel Pacing Threshold Amplitude: 0.5 V
Lead Channel Pacing Threshold Amplitude: 1.25 V
Lead Channel Pacing Threshold Pulse Width: 0.5 ms
Lead Channel Pacing Threshold Pulse Width: 0.5 ms
Lead Channel Sensing Intrinsic Amplitude: 5 mV
Lead Channel Sensing Intrinsic Amplitude: 9.2 mV
Lead Channel Setting Pacing Amplitude: 2 V
Lead Channel Setting Pacing Amplitude: 2.5 V
Lead Channel Setting Pacing Pulse Width: 0.5 ms
Lead Channel Setting Sensing Sensitivity: 2 mV
Pulse Gen Model: 2272
Pulse Gen Serial Number: 7961269

## 2020-04-16 NOTE — Progress Notes (Signed)
Remote pacemaker transmission.   

## 2020-06-15 DIAGNOSIS — C61 Malignant neoplasm of prostate: Secondary | ICD-10-CM | POA: Diagnosis not present

## 2020-06-16 DIAGNOSIS — H5213 Myopia, bilateral: Secondary | ICD-10-CM | POA: Diagnosis not present

## 2020-07-08 ENCOUNTER — Ambulatory Visit (INDEPENDENT_AMBULATORY_CARE_PROVIDER_SITE_OTHER): Payer: Medicare HMO

## 2020-07-08 DIAGNOSIS — I495 Sick sinus syndrome: Secondary | ICD-10-CM

## 2020-07-09 LAB — CUP PACEART REMOTE DEVICE CHECK
Battery Remaining Longevity: 110 mo
Battery Remaining Percentage: 95.5 %
Battery Voltage: 2.99 V
Brady Statistic AP VP Percent: 20 %
Brady Statistic AP VS Percent: 42 %
Brady Statistic AS VP Percent: 1 %
Brady Statistic AS VS Percent: 38 %
Brady Statistic RA Percent Paced: 61 %
Brady Statistic RV Percent Paced: 20 %
Date Time Interrogation Session: 20220525020014
Implantable Lead Implant Date: 20171016
Implantable Lead Implant Date: 20171016
Implantable Lead Location: 753859
Implantable Lead Location: 753860
Implantable Pulse Generator Implant Date: 20171016
Lead Channel Impedance Value: 430 Ohm
Lead Channel Impedance Value: 460 Ohm
Lead Channel Pacing Threshold Amplitude: 0.5 V
Lead Channel Pacing Threshold Amplitude: 1.25 V
Lead Channel Pacing Threshold Pulse Width: 0.5 ms
Lead Channel Pacing Threshold Pulse Width: 0.5 ms
Lead Channel Sensing Intrinsic Amplitude: 5 mV
Lead Channel Sensing Intrinsic Amplitude: 9.3 mV
Lead Channel Setting Pacing Amplitude: 2 V
Lead Channel Setting Pacing Amplitude: 2.5 V
Lead Channel Setting Pacing Pulse Width: 0.5 ms
Lead Channel Setting Sensing Sensitivity: 2 mV
Pulse Gen Model: 2272
Pulse Gen Serial Number: 7961269

## 2020-07-30 NOTE — Progress Notes (Signed)
Remote pacemaker transmission.   

## 2020-09-08 DIAGNOSIS — M5136 Other intervertebral disc degeneration, lumbar region: Secondary | ICD-10-CM | POA: Diagnosis not present

## 2020-09-08 DIAGNOSIS — I495 Sick sinus syndrome: Secondary | ICD-10-CM | POA: Diagnosis not present

## 2020-09-08 DIAGNOSIS — R569 Unspecified convulsions: Secondary | ICD-10-CM | POA: Diagnosis not present

## 2020-09-08 DIAGNOSIS — Z1389 Encounter for screening for other disorder: Secondary | ICD-10-CM | POA: Diagnosis not present

## 2020-09-08 DIAGNOSIS — Z950 Presence of cardiac pacemaker: Secondary | ICD-10-CM | POA: Diagnosis not present

## 2020-09-08 DIAGNOSIS — E7800 Pure hypercholesterolemia, unspecified: Secondary | ICD-10-CM | POA: Diagnosis not present

## 2020-09-08 DIAGNOSIS — Z8679 Personal history of other diseases of the circulatory system: Secondary | ICD-10-CM | POA: Diagnosis not present

## 2020-09-08 DIAGNOSIS — C61 Malignant neoplasm of prostate: Secondary | ICD-10-CM | POA: Diagnosis not present

## 2020-09-08 DIAGNOSIS — Z0000 Encounter for general adult medical examination without abnormal findings: Secondary | ICD-10-CM | POA: Diagnosis not present

## 2020-09-14 ENCOUNTER — Other Ambulatory Visit: Payer: Self-pay | Admitting: Internal Medicine

## 2020-09-14 DIAGNOSIS — E7800 Pure hypercholesterolemia, unspecified: Secondary | ICD-10-CM

## 2020-10-05 DIAGNOSIS — C61 Malignant neoplasm of prostate: Secondary | ICD-10-CM | POA: Diagnosis not present

## 2020-10-07 ENCOUNTER — Ambulatory Visit (INDEPENDENT_AMBULATORY_CARE_PROVIDER_SITE_OTHER): Payer: Medicare HMO

## 2020-10-07 DIAGNOSIS — R001 Bradycardia, unspecified: Secondary | ICD-10-CM

## 2020-10-09 ENCOUNTER — Ambulatory Visit
Admission: RE | Admit: 2020-10-09 | Discharge: 2020-10-09 | Disposition: A | Discharge status: Home / Self Care | Payer: No Typology Code available for payment source | Source: Ambulatory Visit | Attending: Internal Medicine | Admitting: Internal Medicine

## 2020-10-09 DIAGNOSIS — C61 Malignant neoplasm of prostate: Secondary | ICD-10-CM | POA: Diagnosis not present

## 2020-10-09 DIAGNOSIS — E7800 Pure hypercholesterolemia, unspecified: Secondary | ICD-10-CM

## 2020-10-09 DIAGNOSIS — N5201 Erectile dysfunction due to arterial insufficiency: Secondary | ICD-10-CM | POA: Diagnosis not present

## 2020-10-09 LAB — CUP PACEART REMOTE DEVICE CHECK
Battery Remaining Longevity: 60 mo
Battery Remaining Percentage: 56 %
Battery Voltage: 2.99 V
Brady Statistic AP VP Percent: 21 %
Brady Statistic AP VS Percent: 42 %
Brady Statistic AS VP Percent: 1 %
Brady Statistic AS VS Percent: 36 %
Brady Statistic RA Percent Paced: 62 %
Brady Statistic RV Percent Paced: 21 %
Date Time Interrogation Session: 20220826004538
Implantable Lead Implant Date: 20171016
Implantable Lead Implant Date: 20171016
Implantable Lead Location: 753859
Implantable Lead Location: 753860
Implantable Pulse Generator Implant Date: 20171016
Lead Channel Impedance Value: 410 Ohm
Lead Channel Impedance Value: 460 Ohm
Lead Channel Pacing Threshold Amplitude: 0.5 V
Lead Channel Pacing Threshold Amplitude: 1.25 V
Lead Channel Pacing Threshold Pulse Width: 0.5 ms
Lead Channel Pacing Threshold Pulse Width: 0.5 ms
Lead Channel Sensing Intrinsic Amplitude: 10.2 mV
Lead Channel Sensing Intrinsic Amplitude: 5 mV
Lead Channel Setting Pacing Amplitude: 2 V
Lead Channel Setting Pacing Amplitude: 2.5 V
Lead Channel Setting Pacing Pulse Width: 0.5 ms
Lead Channel Setting Sensing Sensitivity: 2 mV
Pulse Gen Model: 2272
Pulse Gen Serial Number: 7961269

## 2020-10-22 NOTE — Progress Notes (Signed)
Remote pacemaker transmission.   

## 2020-12-07 DIAGNOSIS — Z5181 Encounter for therapeutic drug level monitoring: Secondary | ICD-10-CM | POA: Diagnosis not present

## 2020-12-07 DIAGNOSIS — E7800 Pure hypercholesterolemia, unspecified: Secondary | ICD-10-CM | POA: Diagnosis not present

## 2020-12-28 DIAGNOSIS — Z8546 Personal history of malignant neoplasm of prostate: Secondary | ICD-10-CM | POA: Diagnosis not present

## 2020-12-28 DIAGNOSIS — E7800 Pure hypercholesterolemia, unspecified: Secondary | ICD-10-CM | POA: Diagnosis not present

## 2021-01-06 ENCOUNTER — Ambulatory Visit (INDEPENDENT_AMBULATORY_CARE_PROVIDER_SITE_OTHER): Payer: Medicare HMO

## 2021-01-06 DIAGNOSIS — R001 Bradycardia, unspecified: Secondary | ICD-10-CM

## 2021-01-06 LAB — CUP PACEART REMOTE DEVICE CHECK
Battery Remaining Longevity: 58 mo
Battery Remaining Percentage: 54 %
Battery Voltage: 2.98 V
Brady Statistic AP VP Percent: 23 %
Brady Statistic AP VS Percent: 41 %
Brady Statistic AS VP Percent: 1 %
Brady Statistic AS VS Percent: 36 %
Brady Statistic RA Percent Paced: 63 %
Brady Statistic RV Percent Paced: 23 %
Date Time Interrogation Session: 20221123020017
Implantable Lead Implant Date: 20171016
Implantable Lead Implant Date: 20171016
Implantable Lead Location: 753859
Implantable Lead Location: 753860
Implantable Pulse Generator Implant Date: 20171016
Lead Channel Impedance Value: 430 Ohm
Lead Channel Impedance Value: 490 Ohm
Lead Channel Pacing Threshold Amplitude: 0.5 V
Lead Channel Pacing Threshold Amplitude: 1.25 V
Lead Channel Pacing Threshold Pulse Width: 0.5 ms
Lead Channel Pacing Threshold Pulse Width: 0.5 ms
Lead Channel Sensing Intrinsic Amplitude: 10 mV
Lead Channel Sensing Intrinsic Amplitude: 5 mV
Lead Channel Setting Pacing Amplitude: 2 V
Lead Channel Setting Pacing Amplitude: 2.5 V
Lead Channel Setting Pacing Pulse Width: 0.5 ms
Lead Channel Setting Sensing Sensitivity: 2 mV
Pulse Gen Model: 2272
Pulse Gen Serial Number: 7961269

## 2021-01-14 DIAGNOSIS — C61 Malignant neoplasm of prostate: Secondary | ICD-10-CM | POA: Diagnosis not present

## 2021-01-18 NOTE — Progress Notes (Signed)
Remote pacemaker transmission.   

## 2021-02-01 ENCOUNTER — Other Ambulatory Visit: Payer: Self-pay

## 2021-02-01 ENCOUNTER — Ambulatory Visit: Payer: Medicare HMO | Admitting: Internal Medicine

## 2021-02-01 ENCOUNTER — Encounter: Payer: Self-pay | Admitting: Internal Medicine

## 2021-02-01 VITALS — BP 124/74 | HR 62 | Ht 78.0 in | Wt 252.4 lb

## 2021-02-01 DIAGNOSIS — R001 Bradycardia, unspecified: Secondary | ICD-10-CM

## 2021-02-01 DIAGNOSIS — Z950 Presence of cardiac pacemaker: Secondary | ICD-10-CM

## 2021-02-01 NOTE — Progress Notes (Signed)
HPI Anthony Banks returns today for followup. He is a pleasant 70 yo man with a h/o sinus node dysfunction s/p PPM insertion, h/o subarachnoid hemorrhage, and dyslipidemia. He has retired and remains active working out, Marketing executive and fishing and working in his yard. He denies chest pain, sob, or syncope. No palpitations.  Allergies  Allergen Reactions   Aspirin Hives and Other (See Comments)    Tight chest   Morphine Sulfate Anaphylaxis and Swelling    Swelling of the throat   Nsaids Hives and Other (See Comments)    Chest Pains     Current Outpatient Medications  Medication Sig Dispense Refill   acetaminophen (TYLENOL) 325 MG tablet Take 325-650 mg by mouth every 6 (six) hours as needed for moderate pain.      oxcarbazepine (TRILEPTAL) 600 MG tablet Take 1.5 tablets twice a day 270 tablet 3   rosuvastatin (CRESTOR) 5 MG tablet Take 5 mg by mouth daily.     No current facility-administered medications for this visit.     Past Medical History:  Diagnosis Date   AICD (automatic cardioverter/defibrillator) present 2017   st jude   Borderline hyperlipidemia    Cancer ([place])    basal face Dr [name]   Lumbar degenerative disc disease    Nummular eczema    Overweight    Prostate cancer ([place])    Seizure ([place]) 02/2017   Subdural hematoma dec17 2018, jan 2019   Syncope     ROS:   All systems reviewed and negative except as noted in the HPI.   Past Surgical History:  Procedure Laterality Date   basal/squamous cell face     bilateral blepharoplasy     COLONOSCOPY     polyps remove   COLONOSCOPY WITH PROPOFOL N/A 11/24/2014   Procedure: COLONOSCOPY WITH PROPOFOL;  Surgeon: Garlan Fair, MD;  Location: [facility];  Service: Endoscopy;  Laterality: N/A;   CRANIOTOMY N/A 01/30/2017   Procedure: CRANIOTOMY HEMATOMA EVACUATION SUBDURAL;  Surgeon: Consuella Lose, MD;  Location: [facility];  Service: Neurosurgery;  Laterality: N/A;  CRANIOTOMY HEMATOMA EVACUATION  SUBDURAL   CRANIOTOMY Left 03/07/2017   Procedure: CRANIOTOMY HEMATOMA EVACUATION SUBDURAL;  Surgeon: Consuella Lose, MD;  Location: [facility];  Service: Neurosurgery;  Laterality: Left;   EP IMPLANTABLE DEVICE N/A 11/30/2015   Procedure: Pacemaker Implant;  Surgeon: Evans Lance, MD;  Location: [facility];  Service: Cardiovascular;  Laterality: N/A;   L3 Gill procedure with L3 PLIF with interbody implant L3-4 posterlateral asthrodesis and posterior instrumentation of bone graft     LYMPHADENECTOMY Bilateral 05/31/2018   Procedure: LYMPHADENECTOMY, PELVIC;  Surgeon: Raynelle Bring, MD;  Location: [facility];  Service: Urology;  Laterality: Bilateral;   nasal polyps     NASAL SEPTUM SURGERY     ROBOT ASSISTED LAPAROSCOPIC RADICAL PROSTATECTOMY N/A 05/31/2018   Procedure: XI ROBOTIC ASSISTED LAPAROSCOPIC RADICAL PROSTATECTOMY LEVEL 2;  Surgeon: Raynelle Bring, MD;  Location: [facility];  Service: Urology;  Laterality: N/A;   VASECTOMY       Family History  Problem Relation Age of Onset   Asthma Mother    Asthma Sister    Cancer Sister        pancreatic     Social History   Socioeconomic History   Marital status: Married    Spouse name: Not on file   Number of children: Not on file   Years of education: Not on file   Highest education level:  Not on file  Occupational History   Not on file  Tobacco Use   Smoking status: Never   Smokeless tobacco: Never  Vaping Use   Vaping Use: Never used  Substance and Sexual Activity   Alcohol use: Yes   Drug use: No   Sexual activity: Not on file  Other Topics Concern   Not on file  Social History Narrative   Tobacco Use: Cigarettes: Never Smoked   Non-Smoker for Personal reasons   Alcohol: 1-2 Per week, beer   Exercise: Elliptical 5X a week, wts, body wt   Occupation: Quarry manager   Marital Status: Married    Lives in [place]         Pt lives in 2 story home with his wife   Has 2 adult children    Forensic psychologist   Retired Press photographer    Social Determinants of Radio broadcast assistant Strain: Not on Art therapist Insecurity: Not on file  Transportation Needs: Not on file  Physical Activity: Not on file  Stress: Not on file  Social Connections: Not on file  Intimate Partner Violence: Not on file     BP 124/74    Pulse 62    Ht 6\' 6"  (1.981 m)    Wt 252 lb 6.4 oz (114.5 kg)    SpO2 96%    BMI 29.17 kg/m   Physical Exam:  Well appearing NAD HEENT: Unremarkable Neck:  No JVD, no thyromegally Lymphatics:  No adenopathy Back:  No CVA tenderness Lungs:  Clear with no wheezes HEART:  Regular rate rhythm, no murmurs, no rubs, no clicks Abd:  soft, positive bowel sounds, no organomegally, no rebound, no guarding Ext:  2 plus pulses, no edema, no cyanosis, no clubbing Skin:  No rashes no nodules Neuro:  CN II through XII intact, motor grossly intact  EKG - NSR with first degree AV block  DEVICE  Normal device function.  See PaceArt for details.   Assess/Plan:  Sinus node dysfunction - he is asymptomatic s/p PPM insertion. HTN - his bp is well controlled.  PPM - his St. Jude DDD PM is working normally.  Dyslipidemia - he will continue his low dose crestor.   Carleene Overlie Tanvir Hipple,MD

## 2021-02-01 NOTE — Patient Instructions (Signed)
Medication Instructions:  Your physician recommends that you continue on your current medications as directed. Please refer to the Current Medication list given to you today.  Labwork: None ordered.  Testing/Procedures: None ordered.  Follow-Up: Your physician wants you to follow-up in: one year with Cristopher Peru, MD or one of the following Advanced Practice Providers on your designated Care Team:   Tommye Standard, [place] Legrand Como "Jonni Sanger" Chalmers Cater, [place]  Remote monitoring is used to monitor your Pacemaker from home. This monitoring reduces the number of office visits required to check your device to one time per year. It allows [place] to keep an eye on the functioning of your device to ensure it is working properly. You are scheduled for a device check from home on 04/07/2021. You may send your transmission at any time that day. If you have a wireless device, the transmission will be sent automatically. After your physician reviews your transmission, you will receive a postcard with your next transmission date.  Any Other Special Instructions Will Be Listed Below (If Applicable).  If you need a refill on your cardiac medications before your next appointment, please call your pharmacy.

## 2021-02-03 NOTE — Addendum Note (Signed)
Addended by: Drue Novel I on: 02/03/2021 02:55 PM   Modules accepted: Orders

## 2021-03-08 ENCOUNTER — Other Ambulatory Visit: Payer: Self-pay | Admitting: Neurology

## 2021-03-08 DIAGNOSIS — D2272 Melanocytic nevi of left lower limb, including hip: Secondary | ICD-10-CM | POA: Diagnosis not present

## 2021-03-08 DIAGNOSIS — C44619 Basal cell carcinoma of skin of left upper limb, including shoulder: Secondary | ICD-10-CM | POA: Diagnosis not present

## 2021-03-08 DIAGNOSIS — L821 Other seborrheic keratosis: Secondary | ICD-10-CM | POA: Diagnosis not present

## 2021-03-08 DIAGNOSIS — C44519 Basal cell carcinoma of skin of other part of trunk: Secondary | ICD-10-CM | POA: Diagnosis not present

## 2021-03-08 DIAGNOSIS — D485 Neoplasm of uncertain behavior of skin: Secondary | ICD-10-CM | POA: Diagnosis not present

## 2021-03-08 DIAGNOSIS — Z85828 Personal history of other malignant neoplasm of skin: Secondary | ICD-10-CM | POA: Diagnosis not present

## 2021-03-08 DIAGNOSIS — D1801 Hemangioma of skin and subcutaneous tissue: Secondary | ICD-10-CM | POA: Diagnosis not present

## 2021-03-23 ENCOUNTER — Ambulatory Visit: Payer: Medicare HMO | Admitting: Neurology

## 2021-03-23 ENCOUNTER — Other Ambulatory Visit: Payer: Self-pay

## 2021-03-23 ENCOUNTER — Encounter: Payer: Self-pay | Admitting: Neurology

## 2021-03-23 VITALS — BP 134/87 | HR 68 | Resp 18 | Ht 78.0 in | Wt 253.0 lb

## 2021-03-23 DIAGNOSIS — G40119 Localization-related (focal) (partial) symptomatic epilepsy and epileptic syndromes with simple partial seizures, intractable, without status epilepticus: Secondary | ICD-10-CM

## 2021-03-23 MED ORDER — OXCARBAZEPINE 600 MG PO TABS: ORAL_TABLET | ORAL | 3 refills | Status: DC
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
## 2021-03-23 NOTE — Patient Instructions (Signed)
Always a pleasure to see you. Refills have been sent for oxcarbazepine 600mg : take 1 and 1/2 tablets twice a day. See you next year, call for any changes.    Seizure Precautions: 1. If medication has been prescribed for you to prevent seizures, take it exactly as directed.  Do not stop taking the medicine without talking to your doctor first, even if you have not had a seizure in a long time.   2. Avoid activities in which a seizure would cause danger to yourself or to others.  Don't operate dangerous machinery, swim alone, or climb in high or dangerous places, such as on ladders, roofs, or girders.  Do not drive unless your doctor says you may.  3. If you have any warning that you may have a seizure, lay down in a safe place where you can't hurt yourself.    4.  No driving for 6 months from last seizure, as per [facility].   Please refer to the following link on the [facility] website for more information: http://[email or web address].org/[email or web address]/drivingu.cfm   5.  Maintain good sleep hygiene. Avoid alcohol.  6.  Contact your doctor if you have any problems that may be related to the medicine you are taking.  7.  Call 911 and bring the patient back to the ED if:        A.  The seizure lasts longer than 5 minutes.       B.  The patient doesn't awaken shortly after the seizure  C.  The patient has new problems such as difficulty seeing, speaking or moving  D.  The patient was injured during the seizure  E.  The patient has a temperature over 102 F (39C)  F.  The patient vomited and now is having trouble breathing

## 2021-04-07 ENCOUNTER — Ambulatory Visit (INDEPENDENT_AMBULATORY_CARE_PROVIDER_SITE_OTHER): Payer: Medicare HMO

## 2021-04-07 DIAGNOSIS — R001 Bradycardia, unspecified: Secondary | ICD-10-CM

## 2021-04-07 LAB — CUP PACEART REMOTE DEVICE CHECK
Battery Remaining Longevity: 52 mo
Battery Remaining Percentage: 51 %
Battery Voltage: 2.98 V
Brady Statistic AP VP Percent: 47 %
Brady Statistic AP VS Percent: 27 %
Brady Statistic AS VP Percent: 1 %
Brady Statistic AS VS Percent: 25 %
Brady Statistic RA Percent Paced: 73 %
Brady Statistic RV Percent Paced: 47 %
Date Time Interrogation Session: 20230222032309
Implantable Lead Implant Date: 20171016
Implantable Lead Implant Date: 20171016
Implantable Lead Location: 753859
Implantable Lead Location: 753860
Implantable Pulse Generator Implant Date: 20171016
Lead Channel Impedance Value: 440 Ohm
Lead Channel Impedance Value: 460 Ohm
Lead Channel Pacing Threshold Amplitude: 0.5 V
Lead Channel Pacing Threshold Amplitude: 1.25 V
Lead Channel Pacing Threshold Pulse Width: 0.5 ms
Lead Channel Pacing Threshold Pulse Width: 0.5 ms
Lead Channel Sensing Intrinsic Amplitude: 5 mV
Lead Channel Sensing Intrinsic Amplitude: 8.8 mV
Lead Channel Setting Pacing Amplitude: 2 V
Lead Channel Setting Pacing Amplitude: 2.5 V
Lead Channel Setting Pacing Pulse Width: 0.5 ms
Lead Channel Setting Sensing Sensitivity: 2 mV
Pulse Gen Model: 2272
Pulse Gen Serial Number: 7961269

## 2021-04-14 NOTE — Progress Notes (Signed)
Remote pacemaker transmission.   

## 2021-06-04 DIAGNOSIS — C61 Malignant neoplasm of prostate: Secondary | ICD-10-CM | POA: Diagnosis not present

## 2021-06-11 DIAGNOSIS — C61 Malignant neoplasm of prostate: Secondary | ICD-10-CM | POA: Diagnosis not present

## 2021-07-07 ENCOUNTER — Ambulatory Visit (INDEPENDENT_AMBULATORY_CARE_PROVIDER_SITE_OTHER): Payer: Medicare HMO

## 2021-07-07 DIAGNOSIS — I495 Sick sinus syndrome: Secondary | ICD-10-CM

## 2021-07-13 LAB — CUP PACEART REMOTE DEVICE CHECK
Battery Remaining Longevity: 50 mo
Battery Remaining Percentage: 48 %
Battery Voltage: 2.98 V
Brady Statistic AP VP Percent: 45 %
Brady Statistic AP VS Percent: 29 %
Brady Statistic AS VP Percent: 1 %
Brady Statistic AS VS Percent: 26 %
Brady Statistic RA Percent Paced: 73 %
Brady Statistic RV Percent Paced: 45 %
Date Time Interrogation Session: 20230528230654
Implantable Lead Implant Date: 20171016
Implantable Lead Implant Date: 20171016
Implantable Lead Location: 753859
Implantable Lead Location: 753860
Implantable Pulse Generator Implant Date: 20171016
Lead Channel Impedance Value: 460 Ohm
Lead Channel Impedance Value: 510 Ohm
Lead Channel Pacing Threshold Amplitude: 0.5 V
Lead Channel Pacing Threshold Amplitude: 1.25 V
Lead Channel Pacing Threshold Pulse Width: 0.5 ms
Lead Channel Pacing Threshold Pulse Width: 0.5 ms
Lead Channel Sensing Intrinsic Amplitude: 12 mV
Lead Channel Sensing Intrinsic Amplitude: 5 mV
Lead Channel Setting Pacing Amplitude: 2 V
Lead Channel Setting Pacing Amplitude: 2.5 V
Lead Channel Setting Pacing Pulse Width: 0.5 ms
Lead Channel Setting Sensing Sensitivity: 2 mV
Pulse Gen Model: 2272
Pulse Gen Serial Number: 7961269

## 2021-07-19 DIAGNOSIS — C61 Malignant neoplasm of prostate: Secondary | ICD-10-CM | POA: Diagnosis not present

## 2021-07-20 NOTE — Progress Notes (Signed)
Remote pacemaker transmission.   

## 2021-07-21 DIAGNOSIS — H5211 Myopia, right eye: Secondary | ICD-10-CM | POA: Diagnosis not present

## 2021-08-23 DIAGNOSIS — Z1331 Encounter for screening for depression: Secondary | ICD-10-CM | POA: Diagnosis not present

## 2021-08-23 DIAGNOSIS — G40119 Localization-related (focal) (partial) symptomatic epilepsy and epileptic syndromes with simple partial seizures, intractable, without status epilepticus: Secondary | ICD-10-CM | POA: Diagnosis not present

## 2021-08-23 DIAGNOSIS — M5136 Other intervertebral disc degeneration, lumbar region: Secondary | ICD-10-CM | POA: Diagnosis not present

## 2021-08-23 DIAGNOSIS — C61 Malignant neoplasm of prostate: Secondary | ICD-10-CM | POA: Diagnosis not present

## 2021-08-23 DIAGNOSIS — R931 Abnormal findings on diagnostic imaging of heart and coronary circulation: Secondary | ICD-10-CM | POA: Diagnosis not present

## 2021-08-23 DIAGNOSIS — E7800 Pure hypercholesterolemia, unspecified: Secondary | ICD-10-CM | POA: Diagnosis not present

## 2021-08-23 DIAGNOSIS — I7121 Aneurysm of the ascending aorta, without rupture: Secondary | ICD-10-CM | POA: Diagnosis not present

## 2021-08-23 DIAGNOSIS — Z0000 Encounter for general adult medical examination without abnormal findings: Secondary | ICD-10-CM | POA: Diagnosis not present

## 2021-10-06 ENCOUNTER — Ambulatory Visit (INDEPENDENT_AMBULATORY_CARE_PROVIDER_SITE_OTHER): Payer: Medicare HMO

## 2021-10-06 DIAGNOSIS — I495 Sick sinus syndrome: Secondary | ICD-10-CM | POA: Diagnosis not present

## 2021-10-07 LAB — CUP PACEART REMOTE DEVICE CHECK
Battery Remaining Longevity: 46 mo
Battery Remaining Percentage: 46 %
Battery Voltage: 2.98 V
Brady Statistic AP VP Percent: 46 %
Brady Statistic AP VS Percent: 30 %
Brady Statistic AS VP Percent: 1 %
Brady Statistic AS VS Percent: 24 %
Brady Statistic RA Percent Paced: 75 %
Brady Statistic RV Percent Paced: 46 %
Date Time Interrogation Session: 20230823020019
Implantable Lead Implant Date: 20171016
Implantable Lead Implant Date: 20171016
Implantable Lead Location: 753859
Implantable Lead Location: 753860
Implantable Pulse Generator Implant Date: 20171016
Lead Channel Impedance Value: 410 Ohm
Lead Channel Impedance Value: 450 Ohm
Lead Channel Pacing Threshold Amplitude: 0.5 V
Lead Channel Pacing Threshold Amplitude: 1.25 V
Lead Channel Pacing Threshold Pulse Width: 0.5 ms
Lead Channel Pacing Threshold Pulse Width: 0.5 ms
Lead Channel Sensing Intrinsic Amplitude: 5 mV
Lead Channel Sensing Intrinsic Amplitude: 8.2 mV
Lead Channel Setting Pacing Amplitude: 2 V
Lead Channel Setting Pacing Amplitude: 2.5 V
Lead Channel Setting Pacing Pulse Width: 0.5 ms
Lead Channel Setting Sensing Sensitivity: 2 mV
Pulse Gen Model: 2272
Pulse Gen Serial Number: 7961269

## 2021-11-02 NOTE — Progress Notes (Signed)
Remote pacemaker transmission.   

## 2021-11-26 ENCOUNTER — Other Ambulatory Visit: Payer: Self-pay | Admitting: Internal Medicine

## 2021-11-26 DIAGNOSIS — I7121 Aneurysm of the ascending aorta, without rupture: Secondary | ICD-10-CM

## 2021-12-10 DIAGNOSIS — C61 Malignant neoplasm of prostate: Secondary | ICD-10-CM | POA: Diagnosis not present

## 2021-12-17 DIAGNOSIS — C61 Malignant neoplasm of prostate: Secondary | ICD-10-CM | POA: Diagnosis not present

## 2021-12-27 ENCOUNTER — Other Ambulatory Visit (HOSPITAL_COMMUNITY): Payer: Self-pay | Admitting: Urology

## 2021-12-27 DIAGNOSIS — C61 Malignant neoplasm of prostate: Secondary | ICD-10-CM

## 2022-01-05 ENCOUNTER — Ambulatory Visit (INDEPENDENT_AMBULATORY_CARE_PROVIDER_SITE_OTHER): Payer: Medicare HMO

## 2022-01-05 DIAGNOSIS — I495 Sick sinus syndrome: Secondary | ICD-10-CM

## 2022-01-05 LAB — CUP PACEART REMOTE DEVICE CHECK
Battery Remaining Longevity: 44 mo
Battery Remaining Percentage: 43 %
Battery Voltage: 2.96 V
Brady Statistic AP VP Percent: 46 %
Brady Statistic AP VS Percent: 29 %
Brady Statistic AS VP Percent: 1 %
Brady Statistic AS VS Percent: 24 %
Brady Statistic RA Percent Paced: 75 %
Brady Statistic RV Percent Paced: 47 %
Date Time Interrogation Session: 20231122020014
Implantable Lead Connection Status: 753985
Implantable Lead Connection Status: 753985
Implantable Lead Implant Date: 20171016
Implantable Lead Implant Date: 20171016
Implantable Lead Location: 753859
Implantable Lead Location: 753860
Implantable Pulse Generator Implant Date: 20171016
Lead Channel Impedance Value: 430 Ohm
Lead Channel Impedance Value: 460 Ohm
Lead Channel Pacing Threshold Amplitude: 0.5 V
Lead Channel Pacing Threshold Amplitude: 1.25 V
Lead Channel Pacing Threshold Pulse Width: 0.5 ms
Lead Channel Pacing Threshold Pulse Width: 0.5 ms
Lead Channel Sensing Intrinsic Amplitude: 10.3 mV
Lead Channel Sensing Intrinsic Amplitude: 5 mV
Lead Channel Setting Pacing Amplitude: 2 V
Lead Channel Setting Pacing Amplitude: 2.5 V
Lead Channel Setting Pacing Pulse Width: 0.5 ms
Lead Channel Setting Sensing Sensitivity: 2 mV
Pulse Gen Model: 2272
Pulse Gen Serial Number: 7961269

## 2022-01-12 ENCOUNTER — Ambulatory Visit (HOSPITAL_COMMUNITY)
Admission: RE | Admit: 2022-01-12 | Discharge: 2022-01-12 | Disposition: A | Discharge status: Home / Self Care | Payer: Medicare HMO | Source: Ambulatory Visit | Attending: Urology | Admitting: Urology

## 2022-01-12 DIAGNOSIS — C61 Malignant neoplasm of prostate: Secondary | ICD-10-CM | POA: Insufficient documentation

## 2022-01-12 MED ORDER — FLUDEOXYGLUCOSE F - 18 (FDG) INJECTION
8.8000 | Freq: Once | INTRAVENOUS | Status: AC
  Administered 2022-01-12: 8.8 via INTRAVENOUS

## 2022-01-17 ENCOUNTER — Other Ambulatory Visit: Payer: Medicare HMO

## 2022-01-19 DIAGNOSIS — H5202 Hypermetropia, left eye: Secondary | ICD-10-CM | POA: Diagnosis not present

## 2022-01-19 DIAGNOSIS — H5211 Myopia, right eye: Secondary | ICD-10-CM | POA: Diagnosis not present

## 2022-01-19 DIAGNOSIS — H16223 Keratoconjunctivitis sicca, not specified as Sjogren's, bilateral: Secondary | ICD-10-CM | POA: Diagnosis not present

## 2022-01-19 DIAGNOSIS — Z0100 Encounter for examination of eyes and vision without abnormal findings: Secondary | ICD-10-CM | POA: Diagnosis not present

## 2022-01-19 DIAGNOSIS — H524 Presbyopia: Secondary | ICD-10-CM | POA: Diagnosis not present

## 2022-01-19 DIAGNOSIS — H52223 Regular astigmatism, bilateral: Secondary | ICD-10-CM | POA: Diagnosis not present

## 2022-01-25 ENCOUNTER — Encounter: Payer: Self-pay | Admitting: Internal Medicine

## 2022-01-25 NOTE — Progress Notes (Signed)
GU Location of Tumor / Histology: Advanced prostate cancer with biochemical recurrence (05/2021)  Radical Prostatectomy and BPLND on 05/31/2018  If Prostate Cancer, Gleason Score is (4 + 5) and PSA is (4.4 pretreatment PSA)  PSA:  0.30 on 12/10/2021 PSA:  0.21 on 07/20/2021  01/12/2022 Dr. Alinda Money NM PET (PSMA) Skull To Mid Thigh CLINICAL DATA:  Prostate carcinoma with biochemical recurrence.   FINDINGS: NECK No radiotracer activity in neck lymph nodes.   Incidental CT finding: None.   CHEST No radiotracer accumulation within mediastinal or hilar lymph nodes.  No suspicious pulmonary nodules on the CT scan.   Incidental CT finding: None.   ABDOMEN/PELVIS Prostate: Prior prostatectomy. No focal activity in the prostate bed.   Lymph nodes: No abnormal radiotracer accumulation within pelvic or abdominal nodes.   Liver: No evidence of liver metastasis.   Incidental CT finding: Aortic atherosclerotic calcification incidentally noted. Left-sided colonic diverticulosis, without evidence of diverticulitis.   SKELETON No focal activity to suggest skeletal metastasis.   IMPRESSION: No radiographic evidence of locally recurrent or metastatic prostate carcinoma.   Past/Anticipated interventions by urology, if any:     Past/Anticipated interventions by medical oncology, if any:  NA  Weight changes, if any:  No  IPSS:  6 SHIM:  6  Bowel/Bladder complaints, if any:  No  Nausea/Vomiting, if any: No  Pain issues, if any:  0/10  SAFETY ISSUES: Prior radiation?  No Pacemaker/ICD? Yes, Dr. Crissie Sickles Possible current pregnancy? Male Is the patient on methotrexate? No  Current Complaints / other details:

## 2022-01-25 NOTE — Progress Notes (Signed)
oTO BE COMPLETED BY RADIATION ONCOLOGIST OFFICE:   Patient Name: Anthony Banks   Date of Birth: 05/26/1950   Radiation Oncologist:   Site to be Treated:   Will x-rays >10 MV be used? No  Will the radiation be >10 cm from the device? Yes  Planned Treatment Start Date:   TO BE COMPLETED BY CARDIOLOGIST OFFICE:   Device Information:  Pacemaker '[x]'$      ICD '[]'$    Brand: St. Jude/Abbott: [phone] Model #: [place] 2272 Assurity MRI  Serial Number: [id number]     Date of Placement: 11/30/2015  Site of Placement: Left Chest  Remote Device Check--Frequency: Every 91 days   Last Check: 01/05/2022  Is the Patient Pacer Dependent?:  Yes '[]'$   No '[x]'$   Does cardiologist request Radiation Oncology to schedule device testing by vendor for the following:  Prior to the Initiation of Treatments?  Yes '[]'$  No '[x]'$  During Treatments?  Yes '[]'$  No '[x]'$  Post Radiation Treatments?  Yes '[]'$  No '[x]'$   Is device monitoring necessary by vendor/cardiologist team during treatments?  Yes '[]'$   No '[x]'$   Is cardiac monitoring by Radiation Oncology nursing necessary during treatments? Yes '[]'$   No '[x]'$   Do you recommend device be relocated prior to Radiation Treatment? Yes '[]'$   No '[x]'$   **PLEASE LIST ANY NOTES OR SPECIAL REQUESTS:       CARDIOLOGIST SIGNATURE:  Dr. Cristopher Peru Per [facility] Standing Orders, Simone Curia  01/25/2022 11:13 AM  **Please route completed form back to Radiation Oncology Nursing and "P [facility] RAD ONC ADMIN", OR send an update if there will be a delay in having form completed by expected start date.  **Call [phone] if you have any questions or do not get an in-basket response from a Radiation Oncology staff member

## 2022-01-26 NOTE — Progress Notes (Signed)
Remote pacemaker transmission.   

## 2022-01-28 ENCOUNTER — Ambulatory Visit
Admission: RE | Admit: 2022-01-28 | Discharge: 2022-01-28 | Disposition: A | Discharge status: Home / Self Care | Payer: Medicare HMO | Source: Ambulatory Visit | Attending: Radiation Oncology | Admitting: Radiation Oncology

## 2022-01-28 ENCOUNTER — Other Ambulatory Visit: Payer: Self-pay

## 2022-01-28 VITALS — Ht 78.0 in | Wt 245.0 lb

## 2022-01-28 DIAGNOSIS — C61 Malignant neoplasm of prostate: Secondary | ICD-10-CM

## 2022-01-28 DIAGNOSIS — R9721 Rising PSA following treatment for malignant neoplasm of prostate: Secondary | ICD-10-CM | POA: Diagnosis not present

## 2022-01-28 NOTE — Progress Notes (Signed)
Radiation Oncology         [phone]) [phone] ________________________________  Initial outpatient Consultation - Conducted via MyChart due to current COVID-19 concerns for limiting patient exposure  Name: Anthony Banks MRN: [id number]  Date of Service: 01/28/2022 DOB: [birth year]  FG:[id number], [name], MD  Raynelle Bring, MD   REFERRING PHYSICIAN: Raynelle Bring, MD  DIAGNOSIS: 71 year old male with biochemically recurrent prostate cancer with a detectable postoperative PSA at 0.3 s/p RALP in 05/2018 for pT3a,N0, Gleason 4+5 adenocarcinoma of the prostate.    ICD-10-CM   1. Prostate cancer [facility])  C61       HISTORY OF PRESENT ILLNESS: Anthony Banks is a 71 y.o. male seen at the request of Dr. Alinda Money.  He has a history of elevated PSA since 2019, up to 4.4 in November 2019 and had a TRUSPBx with Dr. Diona Fanti on 02/23/2018.  Biopsy confirmed Gleason 4+3 and 4 of 12 sampled cores with a prostate volume of 29 g.  He elected to proceed with robotic assisted laparoscopic prostatectomy under the care of Dr. Alinda Money and this was performed on 05/31/2018.  Final surgical pathology confirmed pT3a, Gleason 4+5 adenocarcinoma of the prostate with extracapsular extension and a focal positive margin at the left posterior lateral mid gland.  His postoperative PSA was initially undetectable until June 2021 when it became low detectable and has continued slowly rising since that time.  The PSA got up to 0.10 in August 2022 increasing to 0.21 in April 2023 and most recently at 0.3 on 12/10/2021.  A PSMA PET scan was performed on 01/12/2022 for disease restaging and this was negative for any evidence of metastatic disease.  He has reviewed the lab and imaging results with his urologist and has been kindly referred today to discuss the potential role of salvage radiotherapy to the prostate fossa and pelvic lymph nodes.  PREVIOUS RADIATION THERAPY: No  PAST MEDICAL HISTORY:  Past Medical History:  Diagnosis Date   AICD  (automatic cardioverter/defibrillator) present 2017   st jude   Borderline hyperlipidemia    Cancer ([place])    basal face Dr [name]   Lumbar degenerative disc disease    Nummular eczema    Overweight    Prostate cancer ([place])    Seizure ([place]) 02/2017   Subdural hematoma dec17 2018, jan 2019   Syncope       PAST SURGICAL HISTORY: Past Surgical History:  Procedure Laterality Date   basal/squamous cell face     bilateral blepharoplasy     COLONOSCOPY     polyps remove   COLONOSCOPY WITH PROPOFOL N/A 11/24/2014   Procedure: COLONOSCOPY WITH PROPOFOL;  Surgeon: Garlan Fair, MD;  Location: [facility];  Service: Endoscopy;  Laterality: N/A;   CRANIOTOMY N/A 01/30/2017   Procedure: CRANIOTOMY HEMATOMA EVACUATION SUBDURAL;  Surgeon: Consuella Lose, MD;  Location: [facility];  Service: Neurosurgery;  Laterality: N/A;  CRANIOTOMY HEMATOMA EVACUATION SUBDURAL   CRANIOTOMY Left 03/07/2017   Procedure: CRANIOTOMY HEMATOMA EVACUATION SUBDURAL;  Surgeon: Consuella Lose, MD;  Location: [facility];  Service: Neurosurgery;  Laterality: Left;   EP IMPLANTABLE DEVICE N/A 11/30/2015   Procedure: Pacemaker Implant;  Surgeon: Evans Lance, MD;  Location: [place];  Service: Cardiovascular;  Laterality: N/A;   L3 Gill procedure with L3 PLIF with interbody implant L3-4 posterlateral asthrodesis and posterior instrumentation of bone graft     LYMPHADENECTOMY Bilateral 05/31/2018   Procedure: LYMPHADENECTOMY, PELVIC;  Surgeon: Raynelle Bring, MD;  Location[facility];  Service: Urology;  Laterality: Bilateral;   nasal polyps     NASAL SEPTUM SURGERY     ROBOT ASSISTED LAPAROSCOPIC RADICAL PROSTATECTOMY N/A 05/31/2018   Procedure: XI ROBOTIC ASSISTED LAPAROSCOPIC RADICAL PROSTATECTOMY LEVEL 2;  Surgeon: Raynelle Bring, MD;  Location[facility];  Service: Urology;  Laterality: N/A;   VASECTOMY      FAMILY HISTORY:  Family History  Problem Relation Age of Onset   Asthma Mother    Asthma Sister     Cancer Sister        pancreatic    SOCIAL HISTORY:  Social History   Socioeconomic History   Marital status: Married    Spouse name: Not on file   Number of children: Not on file   Years of education: Not on file   Highest education level: Not on file  Occupational History   Not on file  Tobacco Use   Smoking status: Never   Smokeless tobacco: Never  Vaping Use   Vaping Use: Never used  Substance and Sexual Activity   Alcohol use: Yes   Drug use: No   Sexual activity: Not on file  Other Topics Concern   Not on file  Social History Narrative   Tobacco Use: Cigarettes: Never Smoked   Non-Smoker for Personal reasons   Alcohol: 1-2 Per week, beer   Exercise: Elliptical 5X a week, wts, body wt   Occupation: Quarry manager   Marital Status: Married    Lives in [place] lives in 2 story home with his wife   Has 2 adult children   Forensic psychologist   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      oxcarbazepine (TRILEPTAL) 600 MG tablet TAKE 1.5 TABLETS BY MOUTH TWICE A DAY 270 tablet 3   rosuvastatin (CRESTOR) 5 MG tablet Take 5 mg by mouth daily.     No current facility-administered medications for this encounter.    REVIEW OF SYSTEMS:  On review of systems, the patient reports that he is doing well overall.  He denies any chest pain, shortness of breath, cough, fevers, chills, night sweats, or recent unintended weight changes.  He denies  any bowel or bladder disturbances, and denies abdominal pain, nausea or vomiting.  He denies any new musculoskeletal or joint aches or pains.  His IPSS score is 6, indicating mild urinary symptoms.  His SH M score was 6, indicating he has severe erectile dysfunction.  A complete review of systems is obtained and is otherwise negative.    PHYSICAL EXAM:  Wt Readings from Last 3 Encounters:  01/28/22 245 lb (111.1 kg)  03/23/21 253 lb (114.8 kg)  02/01/21 252 lb 6.4 oz (114.5 kg)   Temp Readings from Last 3 Encounters:  07/01/19 97.7 F (36.5 C) (Tympanic)  06/01/18 98.2 F (36.8 C) (Oral)  05/24/18 98.7 F (37.1 C) (Oral)   BP Readings from Last 3 Encounters:  03/23/21 134/87  02/01/21  124/74  03/23/20 (!) 141/91   Pulse Readings from Last 3 Encounters:  03/23/21 68  02/01/21 62  03/23/20 98   Pain Assessment Pain Score: 0-No pain/10  In general this is a well appearing Caucasian male in no acute distress. He's alert and oriented x4 and appropriate throughout the examination. Cardiopulmonary assessment is negative for acute distress and he exhibits normal effort.   KPS = 100  100 - Normal; no complaints; no evidence of disease. 90   - Able to carry on normal activity; minor signs or symptoms of disease. 80   - Normal activity with effort; some signs or symptoms of disease. 27   - Cares for self; unable to carry on normal activity or to do active work. 60   - Requires occasional assistance, but is able to care for most of his personal needs. 50   - Requires considerable assistance and frequent medical care. 64   - Disabled; requires special care and assistance. 64   - Severely disabled; hospital admission is indicated although death not imminent. 70   - Very sick; hospital admission necessary; active supportive treatment necessary. 10   - Moribund; fatal processes progressing rapidly. 0     - Dead  Karnofsky DA, Abelmann [place], Craver LS and Burchenal JH [phone]) The use of the  nitrogen mustards in the palliative treatment of carcinoma: with particular reference to bronchogenic carcinoma Cancer 1 634-56  LABORATORY DATA:  Lab Results  Component Value Date   WBC 5.7 05/24/2018   HGB 13.2 06/01/2018   HCT 40.3 06/01/2018   MCV 96.7 05/24/2018   PLT 152 05/24/2018   Lab Results  Component Value Date   NA 139 05/24/2018   K 4.3 05/24/2018   CL 108 05/24/2018   CO2 24 05/24/2018   Lab Results  Component Value Date   ALT 14 (L) 03/03/2017   AST 15 03/03/2017   ALKPHOS 46 03/03/2017   BILITOT 0.6 03/03/2017     RADIOGRAPHY: NM PET (PSMA) SKULL TO MID THIGH  Result Date: 01/13/2022 CLINICAL DATA:  Prostate carcinoma with biochemical recurrence. EXAM: NUCLEAR MEDICINE PET SKULL BASE TO THIGH TECHNIQUE: 8.8 mCi F18 Piflufolastat (Pylarify) was injected intravenously. Full-ring PET imaging was performed from the skull base to thigh after the radiotracer. CT data was obtained and used for attenuation correction and anatomic localization. COMPARISON:  AP CT on 10/17/2012 FINDINGS: NECK No radiotracer activity in neck lymph nodes. Incidental CT finding: None. CHEST No radiotracer accumulation within mediastinal or hilar lymph nodes. No suspicious pulmonary nodules on the CT scan. Incidental CT finding: None. ABDOMEN/PELVIS Prostate: Prior prostatectomy. No focal activity in the prostate bed. Lymph nodes: No abnormal radiotracer accumulation within pelvic or abdominal nodes. Liver: No evidence of liver metastasis. Incidental CT finding: Aortic atherosclerotic calcification incidentally noted. Left-sided colonic diverticulosis, without evidence of diverticulitis. SKELETON No focal activity to suggest skeletal metastasis. IMPRESSION: No radiographic evidence of locally recurrent or metastatic prostate carcinoma. Electronically Signed   By: Marlaine Hind M.D.   On: 01/13/2022 11:29   CUP PACEART REMOTE DEVICE CHECK  Result Date: 01/05/2022 Scheduled remote reviewed. Normal  device function.  Next remote 91 days. LA     IMPRESSION/PLAN: This visit was conducted via MyChart to spare the patient unnecessary potential exposure in the healthcare setting during the current COVID-19 pandemic. 1. 71 y.o. male with biochemically recurrent prostate cancer with a detectable postoperative PSA at 0.3 s/p RALP in 05/2018 for pT3a,N0, Gleason 4+5 adenocarcinoma of the prostate.  Today we reviewed the findings and workup thus far.  We discussed the natural history of prostate cancer.  We reviewed the implications of positive margins, extracapsular extension, and seminal vesicle involvement on the risk of prostate cancer recurrence. We reviewed some of the evidence suggesting an advantage for patients with detectable, rising postoperative PSA, who undergo early adjuvant therapy which can lead to excellent results in terms of disease control and survival. We discussed radiation treatment directed to the prostatic fossa with regard to the logistics and delivery of a 7.5 week course of daily external beam radiotherapy as well as anticipated acute and long-term side effects associated with this treatment.  He was encouraged to ask questions that were answered to his stated satisfaction.  At the conclusion of our conversation, the patient is interested in moving forward with the recommended 7.5 week course of adjuvant external beam therapy. We will share our discussion with Dr. Alinda Money and move forward with treatment planning. The patient appears to have a good understanding of his disease and our treatment recommendations which are of curative intent and is in agreement with the stated plan. Therefore, we will move forward with treatment planning accordingly, in anticipation of beginning IMRT in the near future. We enjoyed meeting him today and look forward to continuing to participate in his care.   Given current concerns for patient exposure during the COVID-19 pandemic, this encounter was  conducted via video-enabled WebEx visit. The patient has given verbal consent for this type of encounter. The attendants for this meeting include Tyler Pita MD, Ashlyn Bruning PA-C, [place], patient Anthony Banks. During the encounter, Tyler Pita MD, Ashlyn Bruning PA-C, and scribe, Wilburn Mylar were located at [place].  Patient Anthony Banks was located at home.   We personally spent 60 minutes in this encounter including chart review, reviewing radiological studies, meeting face-to-face with the patient, entering orders and completing documentation.     Nicholos Johns, PA-C    Tyler Pita, MD  [facility] Direct Dial: [phone]  Fax: [phone] [email or web address]  Skype  [email or web address]   This document serves as a record of services personally performed by Tyler Pita, MD and Freeman Caldron, PA-C. It was created on their behalf by Wilburn Mylar, a trained medical scribe. The creation of this record is based on the scribe's personal observations and the provider's statements to them. This document has been checked and approved by the attending provider.

## 2022-02-02 ENCOUNTER — Encounter: Payer: Self-pay | Admitting: Urology

## 2022-02-02 NOTE — Progress Notes (Signed)
Per communication with Dr. Alinda Money, the patient will start ST-ADT with a 6 month Eligard injection at [facility] next week. He is scheduled for CT [facility] on Tuesday 02/08/22 so I think that timing will work well with him starting treatments the first week of Jan. 2024.  Nicholos Johns, MMS, PA-C [place] [facility] [place]: [phone]  Fax: [phone]

## 2022-02-08 ENCOUNTER — Ambulatory Visit
Admission: RE | Admit: 2022-02-08 | Discharge: 2022-02-08 | Disposition: A | Discharge status: Home / Self Care | Payer: Medicare HMO | Source: Ambulatory Visit | Attending: Radiation Oncology | Admitting: Radiation Oncology

## 2022-02-08 ENCOUNTER — Other Ambulatory Visit: Payer: Self-pay

## 2022-02-08 DIAGNOSIS — C61 Malignant neoplasm of prostate: Secondary | ICD-10-CM | POA: Diagnosis not present

## 2022-02-08 DIAGNOSIS — R9721 Rising PSA following treatment for malignant neoplasm of prostate: Secondary | ICD-10-CM | POA: Diagnosis not present

## 2022-02-08 NOTE — Progress Notes (Signed)
  Radiation Oncology         [phone]) [phone] ________________________________  Name: Anthony Banks MRN: [id number]  Date: 02/08/2022  DOB: January 27, 1951  SIMULATION AND TREATMENT PLANNING NOTE    ICD-10-CM   1. Prostate cancer [facility])  C61       DIAGNOSIS:   71 year old male with biochemically recurrent prostate cancer with a detectable postoperative PSA at 0.3 s/p RALP in 05/2018 for pT3a,N0, Gleason 4+5 adenocarcinoma of the prostate.   NARRATIVE:  The patient was brought to the [employer].  Identity was confirmed.  All relevant records and images related to the planned course of therapy were reviewed.  The patient freely provided informed written consent to proceed with treatment after reviewing the details related to the planned course of therapy. The consent form was witnessed and verified by the simulation staff.  Then, the patient was set-up in a stable reproducible supine position for radiation therapy.  A vacuum lock pillow device was custom fabricated to position his legs in a reproducible immobilized position.  Then, I performed a urethrogram under sterile conditions to identify the prostatic apex.  CT images were obtained.  Surface markings were placed.  The CT images were loaded into the planning software.  Then the prostate target and avoidance structures including the rectum, bladder, bowel and hips were contoured.  Treatment planning then occurred.  The radiation prescription was entered and confirmed.  A total of one complex treatment devices was fabricated. I have requested : Intensity Modulated Radiotherapy (IMRT) is medically necessary for this case for the following reason:  Rectal sparing.Marland Kitchen  PLAN:   The prostate fossa and pelvic lymph nodes will initially be treated to 45 Gy in 25 fractions of 1.8 Gy followed by a boost to the fossa only, to 68.4 Gy with 13 additional fractions of 1.8 Gy   ________________________________  Sheral Apley Tammi Klippel, M.D.

## 2022-02-10 ENCOUNTER — Other Ambulatory Visit: Payer: Medicare HMO

## 2022-02-16 DIAGNOSIS — C61 Malignant neoplasm of prostate: Secondary | ICD-10-CM | POA: Insufficient documentation

## 2022-02-16 DIAGNOSIS — R9721 Rising PSA following treatment for malignant neoplasm of prostate: Secondary | ICD-10-CM | POA: Diagnosis not present

## 2022-02-21 ENCOUNTER — Other Ambulatory Visit: Payer: Self-pay

## 2022-02-21 ENCOUNTER — Ambulatory Visit: Payer: Medicare HMO | Admitting: Neurology

## 2022-02-21 ENCOUNTER — Ambulatory Visit
Admission: RE | Admit: 2022-02-21 | Discharge: 2022-02-21 | Disposition: A | Discharge status: Home / Self Care | Payer: Medicare HMO | Source: Ambulatory Visit | Attending: Radiation Oncology | Admitting: Radiation Oncology

## 2022-02-21 ENCOUNTER — Encounter: Payer: Self-pay | Admitting: Neurology

## 2022-02-21 VITALS — BP 172/119 | HR 92 | Ht 78.0 in | Wt 255.4 lb

## 2022-02-21 DIAGNOSIS — G40119 Localization-related (focal) (partial) symptomatic epilepsy and epileptic syndromes with simple partial seizures, intractable, without status epilepticus: Secondary | ICD-10-CM

## 2022-02-21 DIAGNOSIS — C61 Malignant neoplasm of prostate: Secondary | ICD-10-CM | POA: Diagnosis not present

## 2022-02-21 DIAGNOSIS — Z510 Encounter for antineoplastic radiation therapy: Secondary | ICD-10-CM | POA: Diagnosis not present

## 2022-02-21 DIAGNOSIS — R9721 Rising PSA following treatment for malignant neoplasm of prostate: Secondary | ICD-10-CM | POA: Diagnosis not present

## 2022-02-21 LAB — RAD ONC ARIA SESSION SUMMARY
Course Elapsed Days: 0
Plan Fractions Treated to Date: 1
Plan Prescribed Dose Per Fraction: 1.8 Gy
Plan Total Fractions Prescribed: 25
Plan Total Prescribed Dose: 45 Gy
Reference Point Dosage Given to Date: 1.8 Gy
Reference Point Session Dosage Given: 1.8 Gy
Session Number: 1

## 2022-02-21 MED ORDER — OXCARBAZEPINE 600 MG PO TABS: ORAL_TABLET | ORAL | 3 refills | Status: DC
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
## 2022-02-21 NOTE — Patient Instructions (Signed)
Always a pleasure to see you. Continue oxcarbazepine '600mg'$ : take 1 and 1/2 tablets twice a day. Continue to monitor BP with your PCP. Follow-up in 1 year, call for any changes.   Seizure Precautions: 1. If medication has been prescribed for you to prevent seizures, take it exactly as directed.  Do not stop taking the medicine without talking to your doctor first, even if you have not had a seizure in a long time.   2. Avoid activities in which a seizure would cause danger to yourself or to others.  Don't operate dangerous machinery, swim alone, or climb in high or dangerous places, such as on ladders, roofs, or girders.  Do not drive unless your doctor says you may.  3. If you have any warning that you may have a seizure, lay down in a safe place where you can't hurt yourself.    4.  No driving for 6 months from last seizure, as per [facility].   Please refer to the following link on the [place] website for more information: http://www.epilepsyfoundation.org/answerplace/Social/driving/drivingu.cfm   5.  Maintain good sleep hygiene. Avoid alcohol.  6.  Contact your doctor if you have any problems that may be related to the medicine you are taking.  7.  Call 911 and bring the patient back to the ED if:        A.  The seizure lasts longer than 5 minutes.       B.  The patient doesn't awaken shortly after the seizure  C.  The patient has new problems such as difficulty seeing, speaking or moving  D.  The patient was injured during the seizure  E.  The patient has a temperature over 102 F (39C)  F.  The patient vomited and now is having trouble breathing

## 2022-02-22 ENCOUNTER — Ambulatory Visit
Admission: RE | Admit: 2022-02-22 | Discharge: 2022-02-22 | Disposition: A | Discharge status: Home / Self Care | Payer: Medicare HMO | Source: Ambulatory Visit | Attending: Radiation Oncology | Admitting: Radiation Oncology

## 2022-02-22 ENCOUNTER — Other Ambulatory Visit: Payer: Self-pay

## 2022-02-22 DIAGNOSIS — Z510 Encounter for antineoplastic radiation therapy: Secondary | ICD-10-CM | POA: Diagnosis not present

## 2022-02-22 DIAGNOSIS — R9721 Rising PSA following treatment for malignant neoplasm of prostate: Secondary | ICD-10-CM | POA: Diagnosis not present

## 2022-02-22 DIAGNOSIS — R0989 Other specified symptoms and signs involving the circulatory and respiratory systems: Secondary | ICD-10-CM | POA: Diagnosis not present

## 2022-02-22 DIAGNOSIS — C61 Malignant neoplasm of prostate: Secondary | ICD-10-CM | POA: Diagnosis not present

## 2022-02-22 LAB — RAD ONC ARIA SESSION SUMMARY
Course Elapsed Days: 1
Plan Fractions Treated to Date: 2
Plan Prescribed Dose Per Fraction: 1.8 Gy
Plan Total Fractions Prescribed: 25
Plan Total Prescribed Dose: 45 Gy
Reference Point Dosage Given to Date: 3.6 Gy
Reference Point Session Dosage Given: 1.8 Gy
Session Number: 2

## 2022-02-22 NOTE — Progress Notes (Signed)
RN spoke with patient to update that [facility] is still awaiting insurance approval for the start of ST-ADT.   Patient verbalized understanding, RN will continue to follow.   Plan of care in progress.

## 2022-02-23 ENCOUNTER — Ambulatory Visit
Admission: RE | Admit: 2022-02-23 | Discharge: 2022-02-23 | Disposition: A | Discharge status: Home / Self Care | Payer: Medicare HMO | Source: Ambulatory Visit | Attending: Radiation Oncology | Admitting: Radiation Oncology

## 2022-02-23 ENCOUNTER — Other Ambulatory Visit: Payer: Self-pay

## 2022-02-23 DIAGNOSIS — C61 Malignant neoplasm of prostate: Secondary | ICD-10-CM | POA: Diagnosis not present

## 2022-02-23 DIAGNOSIS — Z510 Encounter for antineoplastic radiation therapy: Secondary | ICD-10-CM | POA: Diagnosis not present

## 2022-02-23 DIAGNOSIS — R9721 Rising PSA following treatment for malignant neoplasm of prostate: Secondary | ICD-10-CM | POA: Diagnosis not present

## 2022-02-23 LAB — RAD ONC ARIA SESSION SUMMARY
Course Elapsed Days: 2
Plan Fractions Treated to Date: 3
Plan Prescribed Dose Per Fraction: 1.8 Gy
Plan Total Fractions Prescribed: 25
Plan Total Prescribed Dose: 45 Gy
Reference Point Dosage Given to Date: 5.4 Gy
Reference Point Session Dosage Given: 1.8 Gy
Session Number: 3

## 2022-02-24 ENCOUNTER — Other Ambulatory Visit: Payer: Self-pay

## 2022-02-24 ENCOUNTER — Ambulatory Visit
Admission: RE | Admit: 2022-02-24 | Discharge: 2022-02-24 | Disposition: A | Discharge status: Home / Self Care | Payer: Medicare HMO | Source: Ambulatory Visit | Attending: Radiation Oncology | Admitting: Radiation Oncology

## 2022-02-24 DIAGNOSIS — R9721 Rising PSA following treatment for malignant neoplasm of prostate: Secondary | ICD-10-CM | POA: Diagnosis not present

## 2022-02-24 DIAGNOSIS — Z510 Encounter for antineoplastic radiation therapy: Secondary | ICD-10-CM | POA: Diagnosis not present

## 2022-02-24 DIAGNOSIS — C61 Malignant neoplasm of prostate: Secondary | ICD-10-CM | POA: Diagnosis not present

## 2022-02-24 LAB — RAD ONC ARIA SESSION SUMMARY
Course Elapsed Days: 3
Plan Fractions Treated to Date: 4
Plan Prescribed Dose Per Fraction: 1.8 Gy
Plan Total Fractions Prescribed: 25
Plan Total Prescribed Dose: 45 Gy
Reference Point Dosage Given to Date: 7.2 Gy
Reference Point Session Dosage Given: 1.8 Gy
Session Number: 4

## 2022-02-25 ENCOUNTER — Ambulatory Visit
Admission: RE | Admit: 2022-02-25 | Discharge: 2022-02-25 | Disposition: A | Discharge status: Home / Self Care | Payer: Medicare HMO | Source: Ambulatory Visit | Attending: Radiation Oncology | Admitting: Radiation Oncology

## 2022-02-25 ENCOUNTER — Other Ambulatory Visit: Payer: Self-pay

## 2022-02-25 DIAGNOSIS — C61 Malignant neoplasm of prostate: Secondary | ICD-10-CM | POA: Diagnosis not present

## 2022-02-25 DIAGNOSIS — Z510 Encounter for antineoplastic radiation therapy: Secondary | ICD-10-CM | POA: Diagnosis not present

## 2022-02-25 DIAGNOSIS — R9721 Rising PSA following treatment for malignant neoplasm of prostate: Secondary | ICD-10-CM | POA: Diagnosis not present

## 2022-02-25 LAB — RAD ONC ARIA SESSION SUMMARY
Course Elapsed Days: 4
Plan Fractions Treated to Date: 5
Plan Prescribed Dose Per Fraction: 1.8 Gy
Plan Total Fractions Prescribed: 25
Plan Total Prescribed Dose: 45 Gy
Reference Point Dosage Given to Date: 9 Gy
Reference Point Session Dosage Given: 1.8 Gy
Session Number: 5

## 2022-02-28 ENCOUNTER — Other Ambulatory Visit: Payer: Self-pay

## 2022-02-28 ENCOUNTER — Ambulatory Visit
Admission: RE | Admit: 2022-02-28 | Discharge: 2022-02-28 | Disposition: A | Discharge status: Home / Self Care | Payer: Medicare HMO | Source: Ambulatory Visit | Attending: Radiation Oncology | Admitting: Radiation Oncology

## 2022-02-28 DIAGNOSIS — Z510 Encounter for antineoplastic radiation therapy: Secondary | ICD-10-CM | POA: Diagnosis not present

## 2022-02-28 DIAGNOSIS — C61 Malignant neoplasm of prostate: Secondary | ICD-10-CM | POA: Diagnosis not present

## 2022-02-28 DIAGNOSIS — R9721 Rising PSA following treatment for malignant neoplasm of prostate: Secondary | ICD-10-CM | POA: Diagnosis not present

## 2022-02-28 LAB — RAD ONC ARIA SESSION SUMMARY
Course Elapsed Days: 7
Plan Fractions Treated to Date: 6
Plan Prescribed Dose Per Fraction: 1.8 Gy
Plan Total Fractions Prescribed: 25
Plan Total Prescribed Dose: 45 Gy
Reference Point Dosage Given to Date: 10.8 Gy
Reference Point Session Dosage Given: 1.8 Gy
Session Number: 6

## 2022-02-28 NOTE — Progress Notes (Signed)
RN contacted [facility] to follow up with initiation of ADT.   Mickel Baas @ [facility] will follow up with authorization status and contact patient.   Will continue to follow to ensure ADT is initiated.

## 2022-03-01 ENCOUNTER — Ambulatory Visit
Admission: RE | Admit: 2022-03-01 | Discharge: 2022-03-01 | Disposition: A | Discharge status: Home / Self Care | Payer: Medicare HMO | Source: Ambulatory Visit | Attending: Radiation Oncology | Admitting: Radiation Oncology

## 2022-03-01 ENCOUNTER — Other Ambulatory Visit: Payer: Self-pay

## 2022-03-01 DIAGNOSIS — C61 Malignant neoplasm of prostate: Secondary | ICD-10-CM | POA: Diagnosis not present

## 2022-03-01 DIAGNOSIS — R9721 Rising PSA following treatment for malignant neoplasm of prostate: Secondary | ICD-10-CM | POA: Diagnosis not present

## 2022-03-01 DIAGNOSIS — Z510 Encounter for antineoplastic radiation therapy: Secondary | ICD-10-CM | POA: Diagnosis not present

## 2022-03-01 LAB — RAD ONC ARIA SESSION SUMMARY
Course Elapsed Days: 8
Plan Fractions Treated to Date: 7
Plan Prescribed Dose Per Fraction: 1.8 Gy
Plan Total Fractions Prescribed: 25
Plan Total Prescribed Dose: 45 Gy
Reference Point Dosage Given to Date: 12.6 Gy
Reference Point Session Dosage Given: 1.8 Gy
Session Number: 7

## 2022-03-02 ENCOUNTER — Other Ambulatory Visit: Payer: Self-pay

## 2022-03-02 ENCOUNTER — Ambulatory Visit
Admission: RE | Admit: 2022-03-02 | Discharge: 2022-03-02 | Disposition: A | Discharge status: Home / Self Care | Payer: Medicare HMO | Source: Ambulatory Visit | Attending: Radiation Oncology | Admitting: Radiation Oncology

## 2022-03-02 DIAGNOSIS — Z510 Encounter for antineoplastic radiation therapy: Secondary | ICD-10-CM | POA: Diagnosis not present

## 2022-03-02 DIAGNOSIS — Z5111 Encounter for antineoplastic chemotherapy: Secondary | ICD-10-CM | POA: Diagnosis not present

## 2022-03-02 DIAGNOSIS — R9721 Rising PSA following treatment for malignant neoplasm of prostate: Secondary | ICD-10-CM | POA: Diagnosis not present

## 2022-03-02 DIAGNOSIS — C61 Malignant neoplasm of prostate: Secondary | ICD-10-CM | POA: Diagnosis not present

## 2022-03-02 LAB — RAD ONC ARIA SESSION SUMMARY
Course Elapsed Days: 9
Plan Fractions Treated to Date: 8
Plan Prescribed Dose Per Fraction: 1.8 Gy
Plan Total Fractions Prescribed: 25
Plan Total Prescribed Dose: 45 Gy
Reference Point Dosage Given to Date: 14.4 Gy
Reference Point Session Dosage Given: 1.8 Gy
Session Number: 8

## 2022-03-03 ENCOUNTER — Ambulatory Visit
Admission: RE | Admit: 2022-03-03 | Discharge: 2022-03-03 | Disposition: A | Discharge status: Home / Self Care | Payer: Medicare HMO | Source: Ambulatory Visit | Attending: Radiation Oncology | Admitting: Radiation Oncology

## 2022-03-03 ENCOUNTER — Other Ambulatory Visit: Payer: Self-pay

## 2022-03-03 DIAGNOSIS — C61 Malignant neoplasm of prostate: Secondary | ICD-10-CM | POA: Diagnosis not present

## 2022-03-03 DIAGNOSIS — Z510 Encounter for antineoplastic radiation therapy: Secondary | ICD-10-CM | POA: Diagnosis not present

## 2022-03-03 DIAGNOSIS — R9721 Rising PSA following treatment for malignant neoplasm of prostate: Secondary | ICD-10-CM | POA: Diagnosis not present

## 2022-03-03 LAB — RAD ONC ARIA SESSION SUMMARY
Course Elapsed Days: 10
Plan Fractions Treated to Date: 9
Plan Prescribed Dose Per Fraction: 1.8 Gy
Plan Total Fractions Prescribed: 25
Plan Total Prescribed Dose: 45 Gy
Reference Point Dosage Given to Date: 16.2 Gy
Reference Point Session Dosage Given: 1.8 Gy
Session Number: 9

## 2022-03-04 ENCOUNTER — Ambulatory Visit
Admission: RE | Admit: 2022-03-04 | Discharge: 2022-03-04 | Disposition: A | Discharge status: Home / Self Care | Payer: Medicare HMO | Source: Ambulatory Visit | Attending: Radiation Oncology | Admitting: Radiation Oncology

## 2022-03-04 ENCOUNTER — Other Ambulatory Visit: Payer: Self-pay

## 2022-03-04 ENCOUNTER — Ambulatory Visit
Admission: RE | Admit: 2022-03-04 | Discharge: 2022-03-04 | Disposition: A | Discharge status: Home / Self Care | Payer: Medicare HMO | Source: Ambulatory Visit | Attending: Internal Medicine | Admitting: Internal Medicine

## 2022-03-04 DIAGNOSIS — Z950 Presence of cardiac pacemaker: Secondary | ICD-10-CM | POA: Diagnosis not present

## 2022-03-04 DIAGNOSIS — I719 Aortic aneurysm of unspecified site, without rupture: Secondary | ICD-10-CM | POA: Diagnosis not present

## 2022-03-04 DIAGNOSIS — C61 Malignant neoplasm of prostate: Secondary | ICD-10-CM | POA: Diagnosis not present

## 2022-03-04 DIAGNOSIS — I7121 Aneurysm of the ascending aorta, without rupture: Secondary | ICD-10-CM

## 2022-03-04 DIAGNOSIS — R911 Solitary pulmonary nodule: Secondary | ICD-10-CM | POA: Diagnosis not present

## 2022-03-04 DIAGNOSIS — Z510 Encounter for antineoplastic radiation therapy: Secondary | ICD-10-CM | POA: Diagnosis not present

## 2022-03-04 DIAGNOSIS — R9721 Rising PSA following treatment for malignant neoplasm of prostate: Secondary | ICD-10-CM | POA: Diagnosis not present

## 2022-03-04 LAB — RAD ONC ARIA SESSION SUMMARY
Course Elapsed Days: 11
Plan Fractions Treated to Date: 10
Plan Prescribed Dose Per Fraction: 1.8 Gy
Plan Total Fractions Prescribed: 25
Plan Total Prescribed Dose: 45 Gy
Reference Point Dosage Given to Date: 18 Gy
Reference Point Session Dosage Given: 1.8 Gy
Session Number: 10

## 2022-03-04 MED ORDER — IOPAMIDOL (ISOVUE-370) INJECTION 76%
75.0000 mL | Freq: Once | INTRAVENOUS | Status: AC | PRN
  Administered 2022-03-04: 75 mL via INTRAVENOUS

## 2022-03-07 ENCOUNTER — Other Ambulatory Visit: Payer: Self-pay

## 2022-03-07 ENCOUNTER — Ambulatory Visit
Admission: RE | Admit: 2022-03-07 | Discharge: 2022-03-07 | Disposition: A | Discharge status: Home / Self Care | Payer: Medicare HMO | Source: Ambulatory Visit | Attending: Radiation Oncology | Admitting: Radiation Oncology

## 2022-03-07 DIAGNOSIS — R9721 Rising PSA following treatment for malignant neoplasm of prostate: Secondary | ICD-10-CM | POA: Diagnosis not present

## 2022-03-07 DIAGNOSIS — Z510 Encounter for antineoplastic radiation therapy: Secondary | ICD-10-CM | POA: Diagnosis not present

## 2022-03-07 DIAGNOSIS — C61 Malignant neoplasm of prostate: Secondary | ICD-10-CM | POA: Diagnosis not present

## 2022-03-07 LAB — RAD ONC ARIA SESSION SUMMARY
Course Elapsed Days: 14
Plan Fractions Treated to Date: 11
Plan Prescribed Dose Per Fraction: 1.8 Gy
Plan Total Fractions Prescribed: 25
Plan Total Prescribed Dose: 45 Gy
Reference Point Dosage Given to Date: 19.8 Gy
Reference Point Session Dosage Given: 1.8 Gy
Session Number: 11

## 2022-03-08 ENCOUNTER — Ambulatory Visit
Admission: RE | Admit: 2022-03-08 | Discharge: 2022-03-08 | Disposition: A | Discharge status: Home / Self Care | Payer: Medicare HMO | Source: Ambulatory Visit | Attending: Radiation Oncology | Admitting: Radiation Oncology

## 2022-03-08 ENCOUNTER — Other Ambulatory Visit: Payer: Self-pay

## 2022-03-08 DIAGNOSIS — Z510 Encounter for antineoplastic radiation therapy: Secondary | ICD-10-CM | POA: Diagnosis not present

## 2022-03-08 DIAGNOSIS — R9721 Rising PSA following treatment for malignant neoplasm of prostate: Secondary | ICD-10-CM | POA: Diagnosis not present

## 2022-03-08 DIAGNOSIS — I7121 Aneurysm of the ascending aorta, without rupture: Secondary | ICD-10-CM | POA: Diagnosis not present

## 2022-03-08 DIAGNOSIS — C61 Malignant neoplasm of prostate: Secondary | ICD-10-CM | POA: Diagnosis not present

## 2022-03-08 DIAGNOSIS — I10 Essential (primary) hypertension: Secondary | ICD-10-CM | POA: Diagnosis not present

## 2022-03-08 LAB — RAD ONC ARIA SESSION SUMMARY
Course Elapsed Days: 15
Plan Fractions Treated to Date: 12
Plan Prescribed Dose Per Fraction: 1.8 Gy
Plan Total Fractions Prescribed: 25
Plan Total Prescribed Dose: 45 Gy
Reference Point Dosage Given to Date: 21.6 Gy
Reference Point Session Dosage Given: 1.8 Gy
Session Number: 12

## 2022-03-09 ENCOUNTER — Ambulatory Visit
Admission: RE | Admit: 2022-03-09 | Discharge: 2022-03-09 | Disposition: A | Discharge status: Home / Self Care | Payer: Medicare HMO | Source: Ambulatory Visit | Attending: Radiation Oncology | Admitting: Radiation Oncology

## 2022-03-09 ENCOUNTER — Other Ambulatory Visit: Payer: Self-pay

## 2022-03-09 DIAGNOSIS — Z510 Encounter for antineoplastic radiation therapy: Secondary | ICD-10-CM | POA: Diagnosis not present

## 2022-03-09 DIAGNOSIS — R9721 Rising PSA following treatment for malignant neoplasm of prostate: Secondary | ICD-10-CM | POA: Diagnosis not present

## 2022-03-09 DIAGNOSIS — C61 Malignant neoplasm of prostate: Secondary | ICD-10-CM | POA: Diagnosis not present

## 2022-03-09 LAB — RAD ONC ARIA SESSION SUMMARY
Course Elapsed Days: 16
Plan Fractions Treated to Date: 13
Plan Prescribed Dose Per Fraction: 1.8 Gy
Plan Total Fractions Prescribed: 25
Plan Total Prescribed Dose: 45 Gy
Reference Point Dosage Given to Date: 23.4 Gy
Reference Point Session Dosage Given: 1.8 Gy
Session Number: 13

## 2022-03-10 ENCOUNTER — Other Ambulatory Visit: Payer: Self-pay

## 2022-03-10 ENCOUNTER — Ambulatory Visit
Admission: RE | Admit: 2022-03-10 | Discharge: 2022-03-10 | Disposition: A | Discharge status: Home / Self Care | Payer: Medicare HMO | Source: Ambulatory Visit | Attending: Radiation Oncology | Admitting: Radiation Oncology

## 2022-03-10 DIAGNOSIS — R9721 Rising PSA following treatment for malignant neoplasm of prostate: Secondary | ICD-10-CM | POA: Diagnosis not present

## 2022-03-10 DIAGNOSIS — C61 Malignant neoplasm of prostate: Secondary | ICD-10-CM | POA: Diagnosis not present

## 2022-03-10 DIAGNOSIS — Z510 Encounter for antineoplastic radiation therapy: Secondary | ICD-10-CM | POA: Diagnosis not present

## 2022-03-10 LAB — RAD ONC ARIA SESSION SUMMARY
Course Elapsed Days: 17
Plan Fractions Treated to Date: 14
Plan Prescribed Dose Per Fraction: 1.8 Gy
Plan Total Fractions Prescribed: 25
Plan Total Prescribed Dose: 45 Gy
Reference Point Dosage Given to Date: 25.2 Gy
Reference Point Session Dosage Given: 1.8 Gy
Session Number: 14

## 2022-03-11 ENCOUNTER — Other Ambulatory Visit: Payer: Self-pay

## 2022-03-11 ENCOUNTER — Ambulatory Visit
Admission: RE | Admit: 2022-03-11 | Discharge: 2022-03-11 | Disposition: A | Discharge status: Home / Self Care | Payer: Medicare HMO | Source: Ambulatory Visit | Attending: Radiation Oncology | Admitting: Radiation Oncology

## 2022-03-11 DIAGNOSIS — R9721 Rising PSA following treatment for malignant neoplasm of prostate: Secondary | ICD-10-CM | POA: Diagnosis not present

## 2022-03-11 DIAGNOSIS — Z510 Encounter for antineoplastic radiation therapy: Secondary | ICD-10-CM | POA: Diagnosis not present

## 2022-03-11 DIAGNOSIS — C61 Malignant neoplasm of prostate: Secondary | ICD-10-CM | POA: Diagnosis not present

## 2022-03-11 LAB — RAD ONC ARIA SESSION SUMMARY
Course Elapsed Days: 18
Plan Fractions Treated to Date: 15
Plan Prescribed Dose Per Fraction: 1.8 Gy
Plan Total Fractions Prescribed: 25
Plan Total Prescribed Dose: 45 Gy
Reference Point Dosage Given to Date: 27 Gy
Reference Point Session Dosage Given: 1.8 Gy
Session Number: 15

## 2022-03-14 ENCOUNTER — Other Ambulatory Visit: Payer: Self-pay

## 2022-03-14 ENCOUNTER — Ambulatory Visit
Admission: RE | Admit: 2022-03-14 | Discharge: 2022-03-14 | Disposition: A | Discharge status: Home / Self Care | Payer: Medicare HMO | Source: Ambulatory Visit | Attending: Radiation Oncology | Admitting: Radiation Oncology

## 2022-03-14 DIAGNOSIS — Z510 Encounter for antineoplastic radiation therapy: Secondary | ICD-10-CM | POA: Diagnosis not present

## 2022-03-14 DIAGNOSIS — R9721 Rising PSA following treatment for malignant neoplasm of prostate: Secondary | ICD-10-CM | POA: Diagnosis not present

## 2022-03-14 DIAGNOSIS — C61 Malignant neoplasm of prostate: Secondary | ICD-10-CM | POA: Diagnosis not present

## 2022-03-14 LAB — RAD ONC ARIA SESSION SUMMARY
Course Elapsed Days: 21
Plan Fractions Treated to Date: 16
Plan Prescribed Dose Per Fraction: 1.8 Gy
Plan Total Fractions Prescribed: 25
Plan Total Prescribed Dose: 45 Gy
Reference Point Dosage Given to Date: 28.8 Gy
Reference Point Session Dosage Given: 1.8 Gy
Session Number: 16

## 2022-03-15 ENCOUNTER — Other Ambulatory Visit: Payer: Self-pay

## 2022-03-15 ENCOUNTER — Ambulatory Visit: Payer: Medicare HMO | Attending: Internal Medicine | Admitting: Internal Medicine

## 2022-03-15 ENCOUNTER — Encounter: Payer: Self-pay | Admitting: Internal Medicine

## 2022-03-15 ENCOUNTER — Ambulatory Visit
Admission: RE | Admit: 2022-03-15 | Discharge: 2022-03-15 | Disposition: A | Discharge status: Home / Self Care | Payer: Medicare HMO | Source: Ambulatory Visit | Attending: Radiation Oncology | Admitting: Radiation Oncology

## 2022-03-15 VITALS — BP 144/88 | HR 61 | Ht 78.0 in | Wt 249.0 lb

## 2022-03-15 DIAGNOSIS — C61 Malignant neoplasm of prostate: Secondary | ICD-10-CM | POA: Diagnosis not present

## 2022-03-15 DIAGNOSIS — I495 Sick sinus syndrome: Secondary | ICD-10-CM | POA: Diagnosis not present

## 2022-03-15 DIAGNOSIS — R9721 Rising PSA following treatment for malignant neoplasm of prostate: Secondary | ICD-10-CM | POA: Diagnosis not present

## 2022-03-15 DIAGNOSIS — Z950 Presence of cardiac pacemaker: Secondary | ICD-10-CM | POA: Diagnosis not present

## 2022-03-15 DIAGNOSIS — Z510 Encounter for antineoplastic radiation therapy: Secondary | ICD-10-CM | POA: Diagnosis not present

## 2022-03-15 LAB — RAD ONC ARIA SESSION SUMMARY
Course Elapsed Days: 22
Plan Fractions Treated to Date: 17
Plan Prescribed Dose Per Fraction: 1.8 Gy
Plan Total Fractions Prescribed: 25
Plan Total Prescribed Dose: 45 Gy
Reference Point Dosage Given to Date: 30.6 Gy
Reference Point Session Dosage Given: 1.8 Gy
Session Number: 17

## 2022-03-15 NOTE — Patient Instructions (Signed)

## 2022-03-15 NOTE — Progress Notes (Signed)
HPI Mr. Cordial returns today for followup. He is a pleasant 72 yo man with a h/o sinus node dysfunction s/p PPM insertion, h/o subarachnoid hemorrhage, and dyslipidemia. He has retired and remains active working out, Marketing executive and fishing and working in his yard. He denies chest pain, sob, or syncope. No palpitations.  He is currently receiving XRT for prostate CA. His appetite is down a bit.  Allergies  Allergen Reactions   Aspirin Hives and Other (See Comments)    Tight chest   Morphine Sulfate Anaphylaxis and Swelling    Swelling of the throat   Nsaids Hives and Other (See Comments)    Chest Pains     Current Outpatient Medications  Medication Sig Dispense Refill   acetaminophen (TYLENOL) 325 MG tablet Take 325-650 mg by mouth every 6 (six) hours as needed for moderate pain.      amLODipine (NORVASC) 5 MG tablet Take 5 mg by mouth daily.     oxcarbazepine (TRILEPTAL) 600 MG tablet TAKE 1.5 TABLETS BY MOUTH TWICE A DAY 270 tablet 3   rosuvastatin (CRESTOR) 5 MG tablet Take 5 mg by mouth daily.     telmisartan (MICARDIS) 40 MG tablet Take 40 mg by mouth daily.     No current facility-administered medications for this visit.     Past Medical History:  Diagnosis Date   AICD (automatic cardioverter/defibrillator) present 2017   st jude   Borderline hyperlipidemia    Cancer ([place])    basal face Dr [name]   Lumbar degenerative disc disease    Nummular eczema    Overweight    Prostate cancer ([place])    Seizure ([place]) 02/2017   Subdural hematoma ([place]) dec17 2018, jan 2019   Syncope     ROS:   All systems reviewed and negative except as noted in the HPI.   Past Surgical History:  Procedure Laterality Date   basal/squamous cell face     bilateral blepharoplasy     COLONOSCOPY     polyps remove   COLONOSCOPY WITH PROPOFOL N/A 11/24/2014   Procedure: COLONOSCOPY WITH PROPOFOL;  Surgeon: Garlan Fair, MD;  Location: [facility];  Service: Endoscopy;   Laterality: N/A;   CRANIOTOMY N/A 01/30/2017   Procedure: CRANIOTOMY HEMATOMA EVACUATION SUBDURAL;  Surgeon: Consuella Lose, MD;  Location: [place];  Service: Neurosurgery;  Laterality: N/A;  CRANIOTOMY HEMATOMA EVACUATION SUBDURAL   CRANIOTOMY Left 03/07/2017   Procedure: CRANIOTOMY HEMATOMA EVACUATION SUBDURAL;  Surgeon: Consuella Lose, MD;  Location: [place];  Service: Neurosurgery;  Laterality: Left;   EP IMPLANTABLE DEVICE N/A 11/30/2015   Procedure: Pacemaker Implant;  Surgeon: Evans Lance, MD;  Location: [place] [facility];  Service: Cardiovascular;  Laterality: N/A;   L3 Gill procedure with L3 PLIF with interbody implant L3-4 posterlateral asthrodesis and posterior instrumentation of bone graft     LYMPHADENECTOMY Bilateral 05/31/2018   Procedure: LYMPHADENECTOMY, PELVIC;  Surgeon: Raynelle Bring, MD;  Location: [facility];  Service: Urology;  Laterality: Bilateral;   nasal polyps     NASAL SEPTUM SURGERY     ROBOT ASSISTED LAPAROSCOPIC RADICAL PROSTATECTOMY N/A 05/31/2018   Procedure: XI ROBOTIC ASSISTED LAPAROSCOPIC RADICAL PROSTATECTOMY LEVEL 2;  Surgeon: Raynelle Bring, MD;  Location: [facility];  Service: Urology;  Laterality: N/A;   VASECTOMY       Family History  Problem Relation Age of Onset   Asthma Mother    Asthma Sister    Cancer Sister  pancreatic     Social History   Socioeconomic History   Marital status: Married    Spouse name: Not on file   Number of children: Not on file   Years of education: Not on file   Highest education level: Not on file  Occupational History   Not on file  Tobacco Use   Smoking status: Never   Smokeless tobacco: Never  Vaping Use   Vaping Use: Never used  Substance and Sexual Activity   Alcohol use: Yes   Drug use: No   Sexual activity: Not on file  Other Topics Concern   Not on file  Social History Narrative   Tobacco Use: Cigarettes: Never Smoked   Non-Smoker for Personal reasons   Alcohol: 1-2 Per week, beer    Exercise: Elliptical 5X a week, wts, body wt   Occupation: Quarry manager   Marital Status: Married    Lives in [place]         Pt lives in 2 story home with his wife   Has 2 adult children   Forensic psychologist   Retired Press photographer    Right handed    Social Determinants of Radio broadcast assistant Strain: Not on file  Food Insecurity: Not on file  Transportation Needs: Not on file  Physical Activity: Not on file  Stress: Not on file  Social Connections: Not on file  Intimate Partner Violence: Not on file     BP (!) 144/88   Pulse 61   Ht '6\' 6"'$  (1.981 m)   Wt 249 lb (112.9 kg)   SpO2 98%   BMI 28.77 kg/m   Physical Exam:  Well appearing NAD HEENT: Unremarkable Neck:  No JVD, no thyromegally Lymphatics:  No adenopathy Back:  No CVA tenderness Lungs:  Clear with no wheezes HEART:  Regular rate rhythm, no murmurs, no rubs, no clicks Abd:  soft, positive bowel sounds, no organomegally, no rebound, no guarding Ext:  2 plus pulses, no edema, no cyanosis, no clubbing Skin:  No rashes no nodules Neuro:  CN II through XII intact, motor grossly intact  EKG - NSR with atrial pacing and marked first degree AV block  DEVICE  Normal device function.  See PaceArt for details.   Assess/Plan:  Sinus node dysfunction - he is asymptomatic s/p PPM insertion. He is pacing more and we have reduced the atrial rate in hopes of pacing left in the ventricle.  HTN - his bp is well controlled.  PPM - his St. Jude DDD PM is working normally.  Dyslipidemia - he will continue his low dose crestor.    Carleene Overlie Andrik Sandt,MD

## 2022-03-16 ENCOUNTER — Other Ambulatory Visit: Payer: Self-pay

## 2022-03-16 ENCOUNTER — Ambulatory Visit
Admission: RE | Admit: 2022-03-16 | Discharge: 2022-03-16 | Disposition: A | Discharge status: Home / Self Care | Payer: Medicare HMO | Source: Ambulatory Visit | Attending: Radiation Oncology | Admitting: Radiation Oncology

## 2022-03-16 DIAGNOSIS — R9721 Rising PSA following treatment for malignant neoplasm of prostate: Secondary | ICD-10-CM | POA: Diagnosis not present

## 2022-03-16 DIAGNOSIS — Z510 Encounter for antineoplastic radiation therapy: Secondary | ICD-10-CM | POA: Diagnosis not present

## 2022-03-16 DIAGNOSIS — C61 Malignant neoplasm of prostate: Secondary | ICD-10-CM | POA: Diagnosis not present

## 2022-03-16 LAB — RAD ONC ARIA SESSION SUMMARY
Course Elapsed Days: 23
Plan Fractions Treated to Date: 18
Plan Prescribed Dose Per Fraction: 1.8 Gy
Plan Total Fractions Prescribed: 25
Plan Total Prescribed Dose: 45 Gy
Reference Point Dosage Given to Date: 32.4 Gy
Reference Point Session Dosage Given: 1.8 Gy
Session Number: 18

## 2022-03-17 ENCOUNTER — Ambulatory Visit: Payer: Medicare HMO

## 2022-03-18 ENCOUNTER — Ambulatory Visit
Admission: RE | Admit: 2022-03-18 | Discharge: 2022-03-18 | Disposition: A | Discharge status: Home / Self Care | Payer: Medicare HMO | Source: Ambulatory Visit | Attending: Radiation Oncology | Admitting: Radiation Oncology

## 2022-03-18 ENCOUNTER — Other Ambulatory Visit: Payer: Self-pay

## 2022-03-18 DIAGNOSIS — Z510 Encounter for antineoplastic radiation therapy: Secondary | ICD-10-CM | POA: Diagnosis not present

## 2022-03-18 DIAGNOSIS — R9721 Rising PSA following treatment for malignant neoplasm of prostate: Secondary | ICD-10-CM | POA: Diagnosis not present

## 2022-03-18 DIAGNOSIS — C61 Malignant neoplasm of prostate: Secondary | ICD-10-CM | POA: Insufficient documentation

## 2022-03-18 LAB — RAD ONC ARIA SESSION SUMMARY
Course Elapsed Days: 25
Plan Fractions Treated to Date: 19
Plan Prescribed Dose Per Fraction: 1.8 Gy
Plan Total Fractions Prescribed: 25
Plan Total Prescribed Dose: 45 Gy
Reference Point Dosage Given to Date: 34.2 Gy
Reference Point Session Dosage Given: 1.8 Gy
Session Number: 19

## 2022-03-21 ENCOUNTER — Other Ambulatory Visit: Payer: Self-pay

## 2022-03-21 ENCOUNTER — Ambulatory Visit
Admission: RE | Admit: 2022-03-21 | Discharge: 2022-03-21 | Disposition: A | Discharge status: Home / Self Care | Payer: Medicare HMO | Source: Ambulatory Visit | Attending: Radiation Oncology | Admitting: Radiation Oncology

## 2022-03-21 DIAGNOSIS — R9721 Rising PSA following treatment for malignant neoplasm of prostate: Secondary | ICD-10-CM | POA: Diagnosis not present

## 2022-03-21 DIAGNOSIS — Z510 Encounter for antineoplastic radiation therapy: Secondary | ICD-10-CM | POA: Diagnosis not present

## 2022-03-21 DIAGNOSIS — C61 Malignant neoplasm of prostate: Secondary | ICD-10-CM | POA: Diagnosis not present

## 2022-03-21 LAB — RAD ONC ARIA SESSION SUMMARY
Course Elapsed Days: 28
Plan Fractions Treated to Date: 20
Plan Prescribed Dose Per Fraction: 1.8 Gy
Plan Total Fractions Prescribed: 25
Plan Total Prescribed Dose: 45 Gy
Reference Point Dosage Given to Date: 36 Gy
Reference Point Session Dosage Given: 1.8 Gy
Session Number: 20

## 2022-03-22 ENCOUNTER — Other Ambulatory Visit: Payer: Self-pay

## 2022-03-22 ENCOUNTER — Ambulatory Visit
Admission: RE | Admit: 2022-03-22 | Discharge: 2022-03-22 | Disposition: A | Discharge status: Home / Self Care | Payer: Medicare HMO | Source: Ambulatory Visit | Attending: Radiation Oncology | Admitting: Radiation Oncology

## 2022-03-22 DIAGNOSIS — Z510 Encounter for antineoplastic radiation therapy: Secondary | ICD-10-CM | POA: Diagnosis not present

## 2022-03-22 DIAGNOSIS — R9721 Rising PSA following treatment for malignant neoplasm of prostate: Secondary | ICD-10-CM | POA: Diagnosis not present

## 2022-03-22 DIAGNOSIS — C61 Malignant neoplasm of prostate: Secondary | ICD-10-CM | POA: Diagnosis not present

## 2022-03-22 LAB — RAD ONC ARIA SESSION SUMMARY
Course Elapsed Days: 29
Plan Fractions Treated to Date: 21
Plan Prescribed Dose Per Fraction: 1.8 Gy
Plan Total Fractions Prescribed: 25
Plan Total Prescribed Dose: 45 Gy
Reference Point Dosage Given to Date: 37.8 Gy
Reference Point Session Dosage Given: 1.8 Gy
Session Number: 21

## 2022-03-23 ENCOUNTER — Other Ambulatory Visit: Payer: Self-pay

## 2022-03-23 ENCOUNTER — Ambulatory Visit
Admission: RE | Admit: 2022-03-23 | Discharge: 2022-03-23 | Disposition: A | Discharge status: Home / Self Care | Payer: Medicare HMO | Source: Ambulatory Visit | Attending: Radiation Oncology | Admitting: Radiation Oncology

## 2022-03-23 DIAGNOSIS — R9721 Rising PSA following treatment for malignant neoplasm of prostate: Secondary | ICD-10-CM | POA: Diagnosis not present

## 2022-03-23 DIAGNOSIS — C61 Malignant neoplasm of prostate: Secondary | ICD-10-CM | POA: Diagnosis not present

## 2022-03-23 DIAGNOSIS — Z510 Encounter for antineoplastic radiation therapy: Secondary | ICD-10-CM | POA: Diagnosis not present

## 2022-03-23 LAB — RAD ONC ARIA SESSION SUMMARY
Course Elapsed Days: 30
Plan Fractions Treated to Date: 22
Plan Prescribed Dose Per Fraction: 1.8 Gy
Plan Total Fractions Prescribed: 25
Plan Total Prescribed Dose: 45 Gy
Reference Point Dosage Given to Date: 39.6 Gy
Reference Point Session Dosage Given: 1.8 Gy
Session Number: 22

## 2022-03-23 NOTE — Addendum Note (Signed)
Addended by: Gwendlyn Deutscher on: 03/23/2022 02:26 PM   Modules accepted: Orders

## 2022-03-24 ENCOUNTER — Ambulatory Visit
Admission: RE | Admit: 2022-03-24 | Discharge: 2022-03-24 | Disposition: A | Discharge status: Home / Self Care | Payer: Medicare HMO | Source: Ambulatory Visit | Attending: Radiation Oncology | Admitting: Radiation Oncology

## 2022-03-24 ENCOUNTER — Other Ambulatory Visit: Payer: Self-pay

## 2022-03-24 DIAGNOSIS — Z510 Encounter for antineoplastic radiation therapy: Secondary | ICD-10-CM | POA: Diagnosis not present

## 2022-03-24 DIAGNOSIS — R9721 Rising PSA following treatment for malignant neoplasm of prostate: Secondary | ICD-10-CM | POA: Diagnosis not present

## 2022-03-24 DIAGNOSIS — C61 Malignant neoplasm of prostate: Secondary | ICD-10-CM | POA: Diagnosis not present

## 2022-03-24 LAB — RAD ONC ARIA SESSION SUMMARY
Course Elapsed Days: 31
Plan Fractions Treated to Date: 23
Plan Prescribed Dose Per Fraction: 1.8 Gy
Plan Total Fractions Prescribed: 25
Plan Total Prescribed Dose: 45 Gy
Reference Point Dosage Given to Date: 41.4 Gy
Reference Point Session Dosage Given: 1.8 Gy
Session Number: 23

## 2022-03-25 ENCOUNTER — Ambulatory Visit
Admission: RE | Admit: 2022-03-25 | Discharge: 2022-03-25 | Disposition: A | Discharge status: Home / Self Care | Payer: Medicare HMO | Source: Ambulatory Visit | Attending: Radiation Oncology | Admitting: Radiation Oncology

## 2022-03-25 ENCOUNTER — Other Ambulatory Visit: Payer: Self-pay

## 2022-03-25 DIAGNOSIS — R9721 Rising PSA following treatment for malignant neoplasm of prostate: Secondary | ICD-10-CM | POA: Diagnosis not present

## 2022-03-25 DIAGNOSIS — Z510 Encounter for antineoplastic radiation therapy: Secondary | ICD-10-CM | POA: Diagnosis not present

## 2022-03-25 DIAGNOSIS — C61 Malignant neoplasm of prostate: Secondary | ICD-10-CM | POA: Diagnosis not present

## 2022-03-25 LAB — RAD ONC ARIA SESSION SUMMARY
Course Elapsed Days: 32
Plan Fractions Treated to Date: 24
Plan Prescribed Dose Per Fraction: 1.8 Gy
Plan Total Fractions Prescribed: 25
Plan Total Prescribed Dose: 45 Gy
Reference Point Dosage Given to Date: 43.2 Gy
Reference Point Session Dosage Given: 1.8 Gy
Session Number: 24

## 2022-03-28 ENCOUNTER — Other Ambulatory Visit: Payer: Self-pay

## 2022-03-28 ENCOUNTER — Ambulatory Visit
Admission: RE | Admit: 2022-03-28 | Discharge: 2022-03-28 | Disposition: A | Discharge status: Home / Self Care | Payer: Medicare HMO | Source: Ambulatory Visit | Attending: Radiation Oncology | Admitting: Radiation Oncology

## 2022-03-28 DIAGNOSIS — Z510 Encounter for antineoplastic radiation therapy: Secondary | ICD-10-CM | POA: Diagnosis not present

## 2022-03-28 DIAGNOSIS — C61 Malignant neoplasm of prostate: Secondary | ICD-10-CM | POA: Diagnosis not present

## 2022-03-28 DIAGNOSIS — R9721 Rising PSA following treatment for malignant neoplasm of prostate: Secondary | ICD-10-CM | POA: Diagnosis not present

## 2022-03-28 LAB — RAD ONC ARIA SESSION SUMMARY
Course Elapsed Days: 35
Plan Fractions Treated to Date: 25
Plan Prescribed Dose Per Fraction: 1.8 Gy
Plan Total Fractions Prescribed: 25
Plan Total Prescribed Dose: 45 Gy
Reference Point Dosage Given to Date: 45 Gy
Reference Point Session Dosage Given: 1.8 Gy
Session Number: 25

## 2022-03-29 ENCOUNTER — Other Ambulatory Visit: Payer: Self-pay

## 2022-03-29 ENCOUNTER — Ambulatory Visit
Admission: RE | Admit: 2022-03-29 | Discharge: 2022-03-29 | Disposition: A | Discharge status: Home / Self Care | Payer: Medicare HMO | Source: Ambulatory Visit | Attending: Radiation Oncology | Admitting: Radiation Oncology

## 2022-03-29 DIAGNOSIS — Z510 Encounter for antineoplastic radiation therapy: Secondary | ICD-10-CM | POA: Diagnosis not present

## 2022-03-29 DIAGNOSIS — C61 Malignant neoplasm of prostate: Secondary | ICD-10-CM | POA: Diagnosis not present

## 2022-03-29 DIAGNOSIS — R9721 Rising PSA following treatment for malignant neoplasm of prostate: Secondary | ICD-10-CM | POA: Diagnosis not present

## 2022-03-29 LAB — RAD ONC ARIA SESSION SUMMARY
Course Elapsed Days: 36
Plan Fractions Treated to Date: 1
Plan Prescribed Dose Per Fraction: 1.8 Gy
Plan Total Fractions Prescribed: 13
Plan Total Prescribed Dose: 23.4 Gy
Reference Point Dosage Given to Date: 1.8 Gy
Reference Point Session Dosage Given: 1.8 Gy
Session Number: 26

## 2022-03-30 ENCOUNTER — Ambulatory Visit
Admission: RE | Admit: 2022-03-30 | Discharge: 2022-03-30 | Disposition: A | Discharge status: Home / Self Care | Payer: Medicare HMO | Source: Ambulatory Visit | Attending: Radiation Oncology | Admitting: Radiation Oncology

## 2022-03-30 ENCOUNTER — Other Ambulatory Visit: Payer: Self-pay

## 2022-03-30 DIAGNOSIS — I495 Sick sinus syndrome: Secondary | ICD-10-CM | POA: Diagnosis not present

## 2022-03-30 DIAGNOSIS — Z510 Encounter for antineoplastic radiation therapy: Secondary | ICD-10-CM | POA: Diagnosis not present

## 2022-03-30 DIAGNOSIS — R9721 Rising PSA following treatment for malignant neoplasm of prostate: Secondary | ICD-10-CM | POA: Diagnosis not present

## 2022-03-30 DIAGNOSIS — I10 Essential (primary) hypertension: Secondary | ICD-10-CM | POA: Diagnosis not present

## 2022-03-30 DIAGNOSIS — I7121 Aneurysm of the ascending aorta, without rupture: Secondary | ICD-10-CM | POA: Diagnosis not present

## 2022-03-30 DIAGNOSIS — C61 Malignant neoplasm of prostate: Secondary | ICD-10-CM | POA: Diagnosis not present

## 2022-03-30 LAB — RAD ONC ARIA SESSION SUMMARY
Course Elapsed Days: 37
Plan Fractions Treated to Date: 2
Plan Prescribed Dose Per Fraction: 1.8 Gy
Plan Total Fractions Prescribed: 13
Plan Total Prescribed Dose: 23.4 Gy
Reference Point Dosage Given to Date: 3.6 Gy
Reference Point Session Dosage Given: 1.8 Gy
Session Number: 27

## 2022-03-31 ENCOUNTER — Other Ambulatory Visit: Payer: Self-pay

## 2022-03-31 ENCOUNTER — Ambulatory Visit
Admission: RE | Admit: 2022-03-31 | Discharge: 2022-03-31 | Disposition: A | Discharge status: Home / Self Care | Payer: Medicare HMO | Source: Ambulatory Visit | Attending: Radiation Oncology | Admitting: Radiation Oncology

## 2022-03-31 DIAGNOSIS — Z510 Encounter for antineoplastic radiation therapy: Secondary | ICD-10-CM | POA: Diagnosis not present

## 2022-03-31 DIAGNOSIS — C61 Malignant neoplasm of prostate: Secondary | ICD-10-CM | POA: Diagnosis not present

## 2022-03-31 DIAGNOSIS — R9721 Rising PSA following treatment for malignant neoplasm of prostate: Secondary | ICD-10-CM | POA: Diagnosis not present

## 2022-03-31 LAB — RAD ONC ARIA SESSION SUMMARY
Course Elapsed Days: 38
Plan Fractions Treated to Date: 3
Plan Prescribed Dose Per Fraction: 1.8 Gy
Plan Total Fractions Prescribed: 13
Plan Total Prescribed Dose: 23.4 Gy
Reference Point Dosage Given to Date: 5.4 Gy
Reference Point Session Dosage Given: 1.8 Gy
Session Number: 28

## 2022-04-01 ENCOUNTER — Ambulatory Visit
Admission: RE | Admit: 2022-04-01 | Discharge: 2022-04-01 | Disposition: A | Discharge status: Home / Self Care | Payer: Medicare HMO | Source: Ambulatory Visit | Attending: Radiation Oncology | Admitting: Radiation Oncology

## 2022-04-01 ENCOUNTER — Other Ambulatory Visit: Payer: Self-pay

## 2022-04-01 DIAGNOSIS — R9721 Rising PSA following treatment for malignant neoplasm of prostate: Secondary | ICD-10-CM | POA: Diagnosis not present

## 2022-04-01 DIAGNOSIS — C61 Malignant neoplasm of prostate: Secondary | ICD-10-CM | POA: Diagnosis not present

## 2022-04-01 DIAGNOSIS — Z510 Encounter for antineoplastic radiation therapy: Secondary | ICD-10-CM | POA: Diagnosis not present

## 2022-04-01 LAB — RAD ONC ARIA SESSION SUMMARY
Course Elapsed Days: 39
Plan Fractions Treated to Date: 4
Plan Prescribed Dose Per Fraction: 1.8 Gy
Plan Total Fractions Prescribed: 13
Plan Total Prescribed Dose: 23.4 Gy
Reference Point Dosage Given to Date: 7.2 Gy
Reference Point Session Dosage Given: 1.8 Gy
Session Number: 29

## 2022-04-04 ENCOUNTER — Ambulatory Visit
Admission: RE | Admit: 2022-04-04 | Discharge: 2022-04-04 | Disposition: A | Discharge status: Home / Self Care | Payer: Medicare HMO | Source: Ambulatory Visit | Attending: Radiation Oncology | Admitting: Radiation Oncology

## 2022-04-04 ENCOUNTER — Other Ambulatory Visit: Payer: Self-pay

## 2022-04-04 DIAGNOSIS — R9721 Rising PSA following treatment for malignant neoplasm of prostate: Secondary | ICD-10-CM | POA: Diagnosis not present

## 2022-04-04 DIAGNOSIS — Z510 Encounter for antineoplastic radiation therapy: Secondary | ICD-10-CM | POA: Diagnosis not present

## 2022-04-04 DIAGNOSIS — C61 Malignant neoplasm of prostate: Secondary | ICD-10-CM | POA: Diagnosis not present

## 2022-04-04 LAB — RAD ONC ARIA SESSION SUMMARY
Course Elapsed Days: 42
Plan Fractions Treated to Date: 5
Plan Prescribed Dose Per Fraction: 1.8 Gy
Plan Total Fractions Prescribed: 13
Plan Total Prescribed Dose: 23.4 Gy
Reference Point Dosage Given to Date: 9 Gy
Reference Point Session Dosage Given: 1.8 Gy
Session Number: 30

## 2022-04-05 ENCOUNTER — Other Ambulatory Visit: Payer: Self-pay

## 2022-04-05 ENCOUNTER — Ambulatory Visit
Admission: RE | Admit: 2022-04-05 | Discharge: 2022-04-05 | Disposition: A | Discharge status: Home / Self Care | Payer: Medicare HMO | Source: Ambulatory Visit | Attending: Radiation Oncology | Admitting: Radiation Oncology

## 2022-04-05 DIAGNOSIS — Z510 Encounter for antineoplastic radiation therapy: Secondary | ICD-10-CM | POA: Diagnosis not present

## 2022-04-05 DIAGNOSIS — C61 Malignant neoplasm of prostate: Secondary | ICD-10-CM | POA: Diagnosis not present

## 2022-04-05 DIAGNOSIS — R9721 Rising PSA following treatment for malignant neoplasm of prostate: Secondary | ICD-10-CM | POA: Diagnosis not present

## 2022-04-05 LAB — RAD ONC ARIA SESSION SUMMARY
Course Elapsed Days: 43
Plan Fractions Treated to Date: 6
Plan Prescribed Dose Per Fraction: 1.8 Gy
Plan Total Fractions Prescribed: 13
Plan Total Prescribed Dose: 23.4 Gy
Reference Point Dosage Given to Date: 10.8 Gy
Reference Point Session Dosage Given: 1.8 Gy
Session Number: 31

## 2022-04-06 ENCOUNTER — Other Ambulatory Visit: Payer: Self-pay

## 2022-04-06 ENCOUNTER — Ambulatory Visit
Admission: RE | Admit: 2022-04-06 | Discharge: 2022-04-06 | Disposition: A | Discharge status: Home / Self Care | Payer: Medicare HMO | Source: Ambulatory Visit | Attending: Radiation Oncology | Admitting: Radiation Oncology

## 2022-04-06 ENCOUNTER — Ambulatory Visit (INDEPENDENT_AMBULATORY_CARE_PROVIDER_SITE_OTHER): Payer: Medicare HMO

## 2022-04-06 DIAGNOSIS — I495 Sick sinus syndrome: Secondary | ICD-10-CM | POA: Diagnosis not present

## 2022-04-06 DIAGNOSIS — Z510 Encounter for antineoplastic radiation therapy: Secondary | ICD-10-CM | POA: Diagnosis not present

## 2022-04-06 DIAGNOSIS — C61 Malignant neoplasm of prostate: Secondary | ICD-10-CM | POA: Diagnosis not present

## 2022-04-06 DIAGNOSIS — R9721 Rising PSA following treatment for malignant neoplasm of prostate: Secondary | ICD-10-CM | POA: Diagnosis not present

## 2022-04-06 LAB — CUP PACEART REMOTE DEVICE CHECK
Battery Remaining Longevity: 49 mo
Battery Remaining Percentage: 40 %
Battery Voltage: 2.98 V
Brady Statistic AP VP Percent: 12 %
Brady Statistic AP VS Percent: 12 %
Brady Statistic AS VP Percent: 7.2 %
Brady Statistic AS VS Percent: 68 %
Brady Statistic RA Percent Paced: 24 %
Brady Statistic RV Percent Paced: 20 %
Date Time Interrogation Session: 20240221020012
Implantable Lead Connection Status: 753985
Implantable Lead Connection Status: 753985
Implantable Lead Implant Date: 20171016
Implantable Lead Implant Date: 20171016
Implantable Lead Location: 753859
Implantable Lead Location: 753860
Implantable Pulse Generator Implant Date: 20171016
Lead Channel Impedance Value: 450 Ohm
Lead Channel Impedance Value: 460 Ohm
Lead Channel Pacing Threshold Amplitude: 0.5 V
Lead Channel Pacing Threshold Amplitude: 1.25 V
Lead Channel Pacing Threshold Pulse Width: 0.5 ms
Lead Channel Pacing Threshold Pulse Width: 0.5 ms
Lead Channel Sensing Intrinsic Amplitude: 5 mV
Lead Channel Sensing Intrinsic Amplitude: 8.5 mV
Lead Channel Setting Pacing Amplitude: 2 V
Lead Channel Setting Pacing Amplitude: 2.5 V
Lead Channel Setting Pacing Pulse Width: 0.5 ms
Lead Channel Setting Sensing Sensitivity: 2 mV
Pulse Gen Model: 2272
Pulse Gen Serial Number: 7961269

## 2022-04-06 LAB — RAD ONC ARIA SESSION SUMMARY
Course Elapsed Days: 44
Plan Fractions Treated to Date: 7
Plan Prescribed Dose Per Fraction: 1.8 Gy
Plan Total Fractions Prescribed: 13
Plan Total Prescribed Dose: 23.4 Gy
Reference Point Dosage Given to Date: 12.6 Gy
Reference Point Session Dosage Given: 1.8 Gy
Session Number: 32

## 2022-04-07 ENCOUNTER — Ambulatory Visit
Admission: RE | Admit: 2022-04-07 | Discharge: 2022-04-07 | Disposition: A | Discharge status: Home / Self Care | Payer: Medicare HMO | Source: Ambulatory Visit | Attending: Radiation Oncology | Admitting: Radiation Oncology

## 2022-04-07 ENCOUNTER — Other Ambulatory Visit: Payer: Self-pay

## 2022-04-07 DIAGNOSIS — R9721 Rising PSA following treatment for malignant neoplasm of prostate: Secondary | ICD-10-CM | POA: Diagnosis not present

## 2022-04-07 DIAGNOSIS — C61 Malignant neoplasm of prostate: Secondary | ICD-10-CM | POA: Diagnosis not present

## 2022-04-07 DIAGNOSIS — Z510 Encounter for antineoplastic radiation therapy: Secondary | ICD-10-CM | POA: Diagnosis not present

## 2022-04-07 LAB — RAD ONC ARIA SESSION SUMMARY
Course Elapsed Days: 45
Plan Fractions Treated to Date: 8
Plan Prescribed Dose Per Fraction: 1.8 Gy
Plan Total Fractions Prescribed: 13
Plan Total Prescribed Dose: 23.4 Gy
Reference Point Dosage Given to Date: 14.4 Gy
Reference Point Session Dosage Given: 1.8 Gy
Session Number: 33

## 2022-04-08 ENCOUNTER — Ambulatory Visit
Admission: RE | Admit: 2022-04-08 | Discharge: 2022-04-08 | Disposition: A | Discharge status: Home / Self Care | Payer: Medicare HMO | Source: Ambulatory Visit | Attending: Radiation Oncology | Admitting: Radiation Oncology

## 2022-04-08 ENCOUNTER — Other Ambulatory Visit: Payer: Self-pay

## 2022-04-08 DIAGNOSIS — Z510 Encounter for antineoplastic radiation therapy: Secondary | ICD-10-CM | POA: Diagnosis not present

## 2022-04-08 DIAGNOSIS — R9721 Rising PSA following treatment for malignant neoplasm of prostate: Secondary | ICD-10-CM | POA: Diagnosis not present

## 2022-04-08 DIAGNOSIS — C61 Malignant neoplasm of prostate: Secondary | ICD-10-CM | POA: Diagnosis not present

## 2022-04-08 LAB — RAD ONC ARIA SESSION SUMMARY
Course Elapsed Days: 46
Plan Fractions Treated to Date: 9
Plan Prescribed Dose Per Fraction: 1.8 Gy
Plan Total Fractions Prescribed: 13
Plan Total Prescribed Dose: 23.4 Gy
Reference Point Dosage Given to Date: 16.2 Gy
Reference Point Session Dosage Given: 1.8 Gy
Session Number: 34

## 2022-04-11 ENCOUNTER — Ambulatory Visit
Admission: RE | Admit: 2022-04-11 | Discharge: 2022-04-11 | Disposition: A | Discharge status: Home / Self Care | Payer: Medicare HMO | Source: Ambulatory Visit | Attending: Radiation Oncology | Admitting: Radiation Oncology

## 2022-04-11 ENCOUNTER — Other Ambulatory Visit: Payer: Self-pay

## 2022-04-11 DIAGNOSIS — R9721 Rising PSA following treatment for malignant neoplasm of prostate: Secondary | ICD-10-CM | POA: Diagnosis not present

## 2022-04-11 DIAGNOSIS — Z510 Encounter for antineoplastic radiation therapy: Secondary | ICD-10-CM | POA: Diagnosis not present

## 2022-04-11 DIAGNOSIS — C61 Malignant neoplasm of prostate: Secondary | ICD-10-CM | POA: Diagnosis not present

## 2022-04-11 LAB — RAD ONC ARIA SESSION SUMMARY
Course Elapsed Days: 49
Plan Fractions Treated to Date: 10
Plan Prescribed Dose Per Fraction: 1.8 Gy
Plan Total Fractions Prescribed: 13
Plan Total Prescribed Dose: 23.4 Gy
Reference Point Dosage Given to Date: 18 Gy
Reference Point Session Dosage Given: 1.8 Gy
Session Number: 35

## 2022-04-12 ENCOUNTER — Other Ambulatory Visit: Payer: Self-pay

## 2022-04-12 ENCOUNTER — Ambulatory Visit
Admission: RE | Admit: 2022-04-12 | Discharge: 2022-04-12 | Disposition: A | Discharge status: Home / Self Care | Payer: Medicare HMO | Source: Ambulatory Visit | Attending: Radiation Oncology | Admitting: Radiation Oncology

## 2022-04-12 DIAGNOSIS — R9721 Rising PSA following treatment for malignant neoplasm of prostate: Secondary | ICD-10-CM | POA: Diagnosis not present

## 2022-04-12 DIAGNOSIS — C61 Malignant neoplasm of prostate: Secondary | ICD-10-CM | POA: Diagnosis not present

## 2022-04-12 DIAGNOSIS — Z510 Encounter for antineoplastic radiation therapy: Secondary | ICD-10-CM | POA: Diagnosis not present

## 2022-04-12 LAB — RAD ONC ARIA SESSION SUMMARY
Course Elapsed Days: 50
Plan Fractions Treated to Date: 11
Plan Prescribed Dose Per Fraction: 1.8 Gy
Plan Total Fractions Prescribed: 13
Plan Total Prescribed Dose: 23.4 Gy
Reference Point Dosage Given to Date: 19.8 Gy
Reference Point Session Dosage Given: 1.8 Gy
Session Number: 36

## 2022-04-13 ENCOUNTER — Ambulatory Visit: Payer: Medicare HMO

## 2022-04-13 ENCOUNTER — Ambulatory Visit
Admission: RE | Admit: 2022-04-13 | Discharge: 2022-04-13 | Disposition: A | Discharge status: Home / Self Care | Payer: Medicare HMO | Source: Ambulatory Visit | Attending: Radiation Oncology | Admitting: Radiation Oncology

## 2022-04-13 ENCOUNTER — Other Ambulatory Visit: Payer: Self-pay

## 2022-04-13 DIAGNOSIS — Z510 Encounter for antineoplastic radiation therapy: Secondary | ICD-10-CM | POA: Diagnosis not present

## 2022-04-13 DIAGNOSIS — R9721 Rising PSA following treatment for malignant neoplasm of prostate: Secondary | ICD-10-CM | POA: Diagnosis not present

## 2022-04-13 DIAGNOSIS — C61 Malignant neoplasm of prostate: Secondary | ICD-10-CM | POA: Diagnosis not present

## 2022-04-13 LAB — RAD ONC ARIA SESSION SUMMARY
Course Elapsed Days: 51
Plan Fractions Treated to Date: 12
Plan Prescribed Dose Per Fraction: 1.8 Gy
Plan Total Fractions Prescribed: 13
Plan Total Prescribed Dose: 23.4 Gy
Reference Point Dosage Given to Date: 21.6 Gy
Reference Point Session Dosage Given: 1.8 Gy
Session Number: 37

## 2022-04-14 ENCOUNTER — Ambulatory Visit: Payer: Medicare HMO

## 2022-04-14 ENCOUNTER — Ambulatory Visit
Admission: RE | Admit: 2022-04-14 | Discharge: 2022-04-14 | Disposition: A | Discharge status: Home / Self Care | Payer: Medicare HMO | Source: Ambulatory Visit | Attending: Radiation Oncology | Admitting: Radiation Oncology

## 2022-04-14 ENCOUNTER — Other Ambulatory Visit: Payer: Self-pay

## 2022-04-14 ENCOUNTER — Encounter: Payer: Self-pay | Admitting: Urology

## 2022-04-14 DIAGNOSIS — C61 Malignant neoplasm of prostate: Secondary | ICD-10-CM | POA: Diagnosis not present

## 2022-04-14 DIAGNOSIS — Z510 Encounter for antineoplastic radiation therapy: Secondary | ICD-10-CM | POA: Diagnosis not present

## 2022-04-14 DIAGNOSIS — R9721 Rising PSA following treatment for malignant neoplasm of prostate: Secondary | ICD-10-CM | POA: Diagnosis not present

## 2022-04-14 LAB — RAD ONC ARIA SESSION SUMMARY
Course Elapsed Days: 52
Plan Fractions Treated to Date: 13
Plan Prescribed Dose Per Fraction: 1.8 Gy
Plan Total Fractions Prescribed: 13
Plan Total Prescribed Dose: 23.4 Gy
Reference Point Dosage Given to Date: 23.4 Gy
Reference Point Session Dosage Given: 1.8 Gy
Session Number: 38

## 2022-04-14 NOTE — Progress Notes (Signed)
Patient was a RadOnc Consult on 01/28/22 for his biochemically recurrent prostate cancer.  Patient proceed with treatment recommendations of 7.5 week course of adjuvant external beam therapy and had his final radiation treatment on 04/14/22.   Patient is scheduled for a post treatment nurse call on 3/26 and has his first post treatment PSA on 6/24 at [facility].

## 2022-04-15 ENCOUNTER — Ambulatory Visit: Payer: Medicare HMO

## 2022-05-04 NOTE — Progress Notes (Signed)
Remote pacemaker transmission.   

## 2022-05-06 NOTE — Progress Notes (Signed)
                                                                                                                                                             Patient Name: Anthony Banks MRN: [id number] DOB: Nov 15, 1950 Referring Physician: [name] (Profile Not Attached) Date of Service: 04/14/2022 [facility] [place], [place]                                                        End Of Treatment Note  Diagnoses: C61-Malignant neoplasm of prostate  Cancer Staging: 72 year old male with biochemically recurrent prostate cancer with a detectable postoperative PSA at 0.3 s/p RALP in 05/2018 for pT3a,N0, Gleason 4+5 adenocarcinoma of the prostate.   Intent: Curative  Radiation Treatment Dates: 02/21/2022 through 04/14/2022 Site Technique Total Dose (Gy) Dose per Fx (Gy) Completed Fx Beam Energies  Prostate Bed: ProstBed_Pelvis IMRT 45/45 1.8 25/25 6X  Prostate Bed: ProstBed_Bst IMRT 23.4/23.4 1.8 13/13 6X   Narrative: The patient tolerated radiation therapy relatively well. By the end of treatment, he reported nocturia x2 and increased frequency that was tolerable without medications.  Plan: The patient will receive a call in about one month from the radiation oncology department. He will continue follow up with his urologist, Dr. Alinda Money as well.  ------------------------------------------------   Tyler Pita, MD [place]: [phone]  Fax: [phone] [email or web address]  Skype  LinkedIn

## 2022-05-09 NOTE — Progress Notes (Signed)
  Radiation Oncology         [phone]) [phone] ________________________________  Name: Anthony Banks MRN: [id number]  Date of Service: 05/10/2022  DOB: [birth year]  Post Treatment Telephone Note  Diagnosis:  72 year old male with biochemically recurrent prostate cancer with a detectable postoperative PSA at 0.3 s/p RALP in 05/2018 for pT3a,N0, Gleason 4+5 adenocarcinoma of the prostate.   Intent: Curative  Radiation Treatment Dates: 02/21/2022 through 04/14/2022 Site Technique Total Dose (Gy) Dose per Fx (Gy) Completed Fx Beam Energies  Prostate Bed: ProstBed_Pelvis IMRT 45/45 1.8 25/25 6X  Prostate Bed: ProstBed_Bst IMRT 23.4/23.4 1.8 13/13 6X   (as documented in provider EOT note)   Pre Treatment IPSS Score: 6 (as documented in the provider consult note)  The patient was not available for call today. Voicemail left.  Patient has a scheduled follow up visit with his urologist, Dr. Alinda Money, on 08/26/22 for ongoing surveillance. He was counseled that PSA levels will be drawn in the urology office, and was reassured that additional time is expected to improve bowel and bladder symptoms. He was encouraged to call back with concerns or questions regarding radiation.    Leandra Kern, LPN

## 2022-05-10 ENCOUNTER — Ambulatory Visit
Admission: RE | Admit: 2022-05-10 | Discharge: 2022-05-10 | Disposition: A | Discharge status: Home / Self Care | Payer: Medicare HMO | Source: Ambulatory Visit | Attending: Internal Medicine | Admitting: Internal Medicine

## 2022-05-17 DIAGNOSIS — I495 Sick sinus syndrome: Secondary | ICD-10-CM | POA: Diagnosis not present

## 2022-05-17 DIAGNOSIS — I10 Essential (primary) hypertension: Secondary | ICD-10-CM | POA: Diagnosis not present

## 2022-05-17 DIAGNOSIS — G40119 Localization-related (focal) (partial) symptomatic epilepsy and epileptic syndromes with simple partial seizures, intractable, without status epilepticus: Secondary | ICD-10-CM | POA: Diagnosis not present

## 2022-05-27 DIAGNOSIS — I10 Essential (primary) hypertension: Secondary | ICD-10-CM | POA: Diagnosis not present

## 2022-06-21 DIAGNOSIS — M542 Cervicalgia: Secondary | ICD-10-CM | POA: Diagnosis not present

## 2022-07-06 ENCOUNTER — Ambulatory Visit (INDEPENDENT_AMBULATORY_CARE_PROVIDER_SITE_OTHER): Payer: Medicare HMO

## 2022-07-06 DIAGNOSIS — I495 Sick sinus syndrome: Secondary | ICD-10-CM

## 2022-07-12 LAB — CUP PACEART REMOTE DEVICE CHECK
Battery Remaining Longevity: 46 mo
Battery Remaining Percentage: 38 %
Battery Voltage: 2.98 V
Brady Statistic AP VP Percent: 11 %
Brady Statistic AP VS Percent: 12 %
Brady Statistic AS VP Percent: 11 %
Brady Statistic AS VS Percent: 67 %
Brady Statistic RA Percent Paced: 22 %
Brady Statistic RV Percent Paced: 22 %
Date Time Interrogation Session: 20240526235213
Implantable Lead Connection Status: 753985
Implantable Lead Connection Status: 753985
Implantable Lead Implant Date: 20171016
Implantable Lead Implant Date: 20171016
Implantable Lead Location: 753859
Implantable Lead Location: 753860
Implantable Pulse Generator Implant Date: 20171016
Lead Channel Impedance Value: 440 Ohm
Lead Channel Impedance Value: 460 Ohm
Lead Channel Pacing Threshold Amplitude: 0.5 V
Lead Channel Pacing Threshold Amplitude: 1.25 V
Lead Channel Pacing Threshold Pulse Width: 0.5 ms
Lead Channel Pacing Threshold Pulse Width: 0.5 ms
Lead Channel Sensing Intrinsic Amplitude: 5 mV
Lead Channel Sensing Intrinsic Amplitude: 7.9 mV
Lead Channel Setting Pacing Amplitude: 2 V
Lead Channel Setting Pacing Amplitude: 2.5 V
Lead Channel Setting Pacing Pulse Width: 0.5 ms
Lead Channel Setting Sensing Sensitivity: 2 mV
Pulse Gen Model: 2272
Pulse Gen Serial Number: 7961269

## 2022-07-27 NOTE — Progress Notes (Signed)
Remote pacemaker transmission.   

## 2022-08-08 DIAGNOSIS — C61 Malignant neoplasm of prostate: Secondary | ICD-10-CM | POA: Diagnosis not present

## 2022-08-26 DIAGNOSIS — C61 Malignant neoplasm of prostate: Secondary | ICD-10-CM | POA: Diagnosis not present

## 2022-08-29 DIAGNOSIS — E7800 Pure hypercholesterolemia, unspecified: Secondary | ICD-10-CM | POA: Diagnosis not present

## 2022-08-29 DIAGNOSIS — D649 Anemia, unspecified: Secondary | ICD-10-CM | POA: Diagnosis not present

## 2022-08-29 DIAGNOSIS — M5136 Other intervertebral disc degeneration, lumbar region: Secondary | ICD-10-CM | POA: Diagnosis not present

## 2022-08-29 DIAGNOSIS — Z1211 Encounter for screening for malignant neoplasm of colon: Secondary | ICD-10-CM | POA: Diagnosis not present

## 2022-08-29 DIAGNOSIS — I495 Sick sinus syndrome: Secondary | ICD-10-CM | POA: Diagnosis not present

## 2022-08-29 DIAGNOSIS — I10 Essential (primary) hypertension: Secondary | ICD-10-CM | POA: Diagnosis not present

## 2022-08-29 DIAGNOSIS — C61 Malignant neoplasm of prostate: Secondary | ICD-10-CM | POA: Diagnosis not present

## 2022-08-29 DIAGNOSIS — Z79899 Other long term (current) drug therapy: Secondary | ICD-10-CM | POA: Diagnosis not present

## 2022-08-29 DIAGNOSIS — Z1331 Encounter for screening for depression: Secondary | ICD-10-CM | POA: Diagnosis not present

## 2022-08-29 DIAGNOSIS — R931 Abnormal findings on diagnostic imaging of heart and coronary circulation: Secondary | ICD-10-CM | POA: Diagnosis not present

## 2022-08-29 DIAGNOSIS — Z0000 Encounter for general adult medical examination without abnormal findings: Secondary | ICD-10-CM | POA: Diagnosis not present

## 2022-08-29 DIAGNOSIS — G40119 Localization-related (focal) (partial) symptomatic epilepsy and epileptic syndromes with simple partial seizures, intractable, without status epilepticus: Secondary | ICD-10-CM | POA: Diagnosis not present

## 2022-10-05 ENCOUNTER — Ambulatory Visit (INDEPENDENT_AMBULATORY_CARE_PROVIDER_SITE_OTHER): Payer: Medicare HMO

## 2022-10-05 DIAGNOSIS — I495 Sick sinus syndrome: Secondary | ICD-10-CM | POA: Diagnosis not present

## 2022-10-05 LAB — CUP PACEART REMOTE DEVICE CHECK
Battery Remaining Longevity: 44 mo
Battery Remaining Percentage: 36 %
Battery Voltage: 2.96 V
Brady Statistic AP VP Percent: 8.8 %
Brady Statistic AP VS Percent: 12 %
Brady Statistic AS VP Percent: 7.1 %
Brady Statistic AS VS Percent: 72 %
Brady Statistic RA Percent Paced: 21 %
Brady Statistic RV Percent Paced: 16 %
Date Time Interrogation Session: 20240821020016
Implantable Lead Connection Status: 753985
Implantable Lead Connection Status: 753985
Implantable Lead Implant Date: 20171016
Implantable Lead Implant Date: 20171016
Implantable Lead Location: 753859
Implantable Lead Location: 753860
Implantable Pulse Generator Implant Date: 20171016
Lead Channel Impedance Value: 440 Ohm
Lead Channel Impedance Value: 460 Ohm
Lead Channel Pacing Threshold Amplitude: 0.5 V
Lead Channel Pacing Threshold Amplitude: 1.25 V
Lead Channel Pacing Threshold Pulse Width: 0.5 ms
Lead Channel Pacing Threshold Pulse Width: 0.5 ms
Lead Channel Sensing Intrinsic Amplitude: 5 mV
Lead Channel Sensing Intrinsic Amplitude: 7.8 mV
Lead Channel Setting Pacing Amplitude: 2 V
Lead Channel Setting Pacing Amplitude: 2.5 V
Lead Channel Setting Pacing Pulse Width: 0.5 ms
Lead Channel Setting Sensing Sensitivity: 2 mV
Pulse Gen Model: 2272
Pulse Gen Serial Number: 7961269

## 2022-10-18 NOTE — Progress Notes (Signed)
Remote pacemaker transmission.   

## 2022-12-28 DIAGNOSIS — K5730 Diverticulosis of large intestine without perforation or abscess without bleeding: Secondary | ICD-10-CM | POA: Diagnosis not present

## 2022-12-28 DIAGNOSIS — K6289 Other specified diseases of anus and rectum: Secondary | ICD-10-CM | POA: Diagnosis not present

## 2022-12-28 DIAGNOSIS — Z09 Encounter for follow-up examination after completed treatment for conditions other than malignant neoplasm: Secondary | ICD-10-CM | POA: Diagnosis not present

## 2022-12-28 DIAGNOSIS — K648 Other hemorrhoids: Secondary | ICD-10-CM | POA: Diagnosis not present

## 2022-12-28 DIAGNOSIS — D123 Benign neoplasm of transverse colon: Secondary | ICD-10-CM | POA: Diagnosis not present

## 2022-12-28 DIAGNOSIS — Z860100 Personal history of colon polyps, unspecified: Secondary | ICD-10-CM | POA: Diagnosis not present

## 2022-12-30 DIAGNOSIS — D123 Benign neoplasm of transverse colon: Secondary | ICD-10-CM | POA: Diagnosis not present

## 2023-01-04 ENCOUNTER — Ambulatory Visit (INDEPENDENT_AMBULATORY_CARE_PROVIDER_SITE_OTHER): Payer: Medicare HMO

## 2023-01-04 DIAGNOSIS — I495 Sick sinus syndrome: Secondary | ICD-10-CM

## 2023-01-04 LAB — CUP PACEART REMOTE DEVICE CHECK
Battery Remaining Longevity: 42 mo
Battery Remaining Percentage: 34 %
Battery Voltage: 2.96 V
Brady Statistic AP VP Percent: 7.9 %
Brady Statistic AP VS Percent: 12 %
Brady Statistic AS VP Percent: 5.5 %
Brady Statistic AS VS Percent: 74 %
Brady Statistic RA Percent Paced: 20 %
Brady Statistic RV Percent Paced: 13 %
Date Time Interrogation Session: 20241120020015
Implantable Lead Connection Status: 753985
Implantable Lead Connection Status: 753985
Implantable Lead Implant Date: 20171016
Implantable Lead Implant Date: 20171016
Implantable Lead Location: 753859
Implantable Lead Location: 753860
Implantable Pulse Generator Implant Date: 20171016
Lead Channel Impedance Value: 430 Ohm
Lead Channel Impedance Value: 460 Ohm
Lead Channel Pacing Threshold Amplitude: 0.5 V
Lead Channel Pacing Threshold Amplitude: 1.25 V
Lead Channel Pacing Threshold Pulse Width: 0.5 ms
Lead Channel Pacing Threshold Pulse Width: 0.5 ms
Lead Channel Sensing Intrinsic Amplitude: 5 mV
Lead Channel Sensing Intrinsic Amplitude: 8.1 mV
Lead Channel Setting Pacing Amplitude: 2 V
Lead Channel Setting Pacing Amplitude: 2.5 V
Lead Channel Setting Pacing Pulse Width: 0.5 ms
Lead Channel Setting Sensing Sensitivity: 2 mV
Pulse Gen Model: 2272
Pulse Gen Serial Number: 7961269

## 2023-01-31 NOTE — Progress Notes (Signed)
Remote pacemaker transmission.   

## 2023-02-17 ENCOUNTER — Encounter: Payer: Self-pay | Admitting: Neurology

## 2023-02-17 ENCOUNTER — Ambulatory Visit (INDEPENDENT_AMBULATORY_CARE_PROVIDER_SITE_OTHER): Payer: Medicare HMO | Admitting: Neurology

## 2023-02-17 VITALS — BP 132/88 | HR 71 | Ht 78.0 in | Wt 253.0 lb

## 2023-02-17 DIAGNOSIS — G40119 Localization-related (focal) (partial) symptomatic epilepsy and epileptic syndromes with simple partial seizures, intractable, without status epilepticus: Secondary | ICD-10-CM
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
## 2023-02-17 NOTE — Patient Instructions (Signed)
 Always a pleasure to see you. Continue Trileptal  (Oxcarbazepine ) 600mg : Take 1 and 1/2 tablets twice a day. Let me know once you are needing refills. Follow-up in 1 year, call for any changes.    Seizure Precautions: 1. If medication has been prescribed for you to prevent seizures, take it exactly as directed.  Do not stop taking the medicine without talking to your doctor first, even if you have not had a seizure in a long time.   2. Avoid activities in which a seizure would cause danger to yourself or to others.  Don't operate dangerous machinery, swim alone, or climb in high or dangerous places, such as on ladders, roofs, or girders.  Do not drive unless your doctor says you may.  3. If you have any warning that you may have a seizure, lay down in a safe place where you can't hurt yourself.    4.  No driving for 6 months from last seizure, as per [place]  state law.   Please refer to the following link on the Epilepsy Foundation of America's website for more information: http://www.epilepsyfoundation.org/answerplace/Social/driving/drivingu.cfm   5.  Maintain good sleep hygiene. Avoid alcohol  6.  Contact your doctor if you have any problems that may be related to the medicine you are taking.  7.  Call 911 and bring the patient back to the ED if:        A.  The seizure lasts longer than 5 minutes.       B.  The patient doesn't awaken shortly after the seizure  C.  The patient has new problems such as difficulty seeing, speaking or moving  D.  The patient was injured during the seizure  E.  The patient has a temperature over 102 F (39C)  F.  The patient vomited and now is having trouble breathing

## 2023-02-27 DIAGNOSIS — C61 Malignant neoplasm of prostate: Secondary | ICD-10-CM | POA: Diagnosis not present

## 2023-02-27 DIAGNOSIS — R931 Abnormal findings on diagnostic imaging of heart and coronary circulation: Secondary | ICD-10-CM | POA: Diagnosis not present

## 2023-02-27 DIAGNOSIS — I10 Essential (primary) hypertension: Secondary | ICD-10-CM | POA: Diagnosis not present

## 2023-02-27 DIAGNOSIS — I495 Sick sinus syndrome: Secondary | ICD-10-CM | POA: Diagnosis not present

## 2023-02-27 DIAGNOSIS — G40119 Localization-related (focal) (partial) symptomatic epilepsy and epileptic syndromes with simple partial seizures, intractable, without status epilepticus: Secondary | ICD-10-CM | POA: Diagnosis not present

## 2023-02-27 DIAGNOSIS — E7800 Pure hypercholesterolemia, unspecified: Secondary | ICD-10-CM | POA: Diagnosis not present

## 2023-04-05 ENCOUNTER — Ambulatory Visit (INDEPENDENT_AMBULATORY_CARE_PROVIDER_SITE_OTHER): Payer: Medicare HMO

## 2023-04-05 DIAGNOSIS — I495 Sick sinus syndrome: Secondary | ICD-10-CM | POA: Diagnosis not present

## 2023-04-06 ENCOUNTER — Encounter: Payer: Self-pay | Admitting: Internal Medicine

## 2023-04-06 LAB — CUP PACEART REMOTE DEVICE CHECK
Battery Remaining Longevity: 40 mo
Battery Remaining Percentage: 32 %
Battery Voltage: 2.96 V
Brady Statistic AP VP Percent: 8.3 %
Brady Statistic AP VS Percent: 11 %
Brady Statistic AS VP Percent: 5.6 %
Brady Statistic AS VS Percent: 75 %
Brady Statistic RA Percent Paced: 19 %
Brady Statistic RV Percent Paced: 14 %
Date Time Interrogation Session: 20250219020020
Implantable Lead Connection Status: 753985
Implantable Lead Connection Status: 753985
Implantable Lead Implant Date: 20171016
Implantable Lead Implant Date: 20171016
Implantable Lead Location: 753859
Implantable Lead Location: 753860
Implantable Pulse Generator Implant Date: 20171016
Lead Channel Impedance Value: 460 Ohm
Lead Channel Impedance Value: 490 Ohm
Lead Channel Pacing Threshold Amplitude: 0.5 V
Lead Channel Pacing Threshold Amplitude: 1.25 V
Lead Channel Pacing Threshold Pulse Width: 0.5 ms
Lead Channel Pacing Threshold Pulse Width: 0.5 ms
Lead Channel Sensing Intrinsic Amplitude: 5 mV
Lead Channel Sensing Intrinsic Amplitude: 8.7 mV
Lead Channel Setting Pacing Amplitude: 2 V
Lead Channel Setting Pacing Amplitude: 2.5 V
Lead Channel Setting Pacing Pulse Width: 0.5 ms
Lead Channel Setting Sensing Sensitivity: 2 mV
Pulse Gen Model: 2272
Pulse Gen Serial Number: 7961269

## 2023-04-10 DIAGNOSIS — Z79899 Other long term (current) drug therapy: Secondary | ICD-10-CM | POA: Diagnosis not present

## 2023-04-26 ENCOUNTER — Other Ambulatory Visit: Payer: Self-pay | Admitting: Neurology

## 2023-05-01 DIAGNOSIS — H5211 Myopia, right eye: Secondary | ICD-10-CM | POA: Diagnosis not present

## 2023-05-10 NOTE — Progress Notes (Signed)
 Remote pacemaker transmission.

## 2023-05-10 NOTE — Addendum Note (Signed)
 Addended by: Elease Etienne A on: 05/10/2023 11:23 AM   Modules accepted: Orders

## 2023-07-05 ENCOUNTER — Ambulatory Visit: Payer: Medicare HMO | Attending: Internal Medicine

## 2023-07-05 DIAGNOSIS — I495 Sick sinus syndrome: Secondary | ICD-10-CM

## 2023-07-05 LAB — CUP PACEART REMOTE DEVICE CHECK
Battery Remaining Longevity: 37 mo
Battery Remaining Percentage: 30 %
Battery Voltage: 2.95 V
Brady Statistic AP VP Percent: 8.8 %
Brady Statistic AP VS Percent: 11 %
Brady Statistic AS VP Percent: 5.2 %
Brady Statistic AS VS Percent: 75 %
Brady Statistic RA Percent Paced: 20 %
Brady Statistic RV Percent Paced: 14 %
Date Time Interrogation Session: 20250521023620
Implantable Lead Connection Status: 753985
Implantable Lead Connection Status: 753985
Implantable Lead Implant Date: 20171016
Implantable Lead Implant Date: 20171016
Implantable Lead Location: 753859
Implantable Lead Location: 753860
Implantable Pulse Generator Implant Date: 20171016
Lead Channel Impedance Value: 410 Ohm
Lead Channel Impedance Value: 460 Ohm
Lead Channel Pacing Threshold Amplitude: 0.5 V
Lead Channel Pacing Threshold Amplitude: 1.25 V
Lead Channel Pacing Threshold Pulse Width: 0.5 ms
Lead Channel Pacing Threshold Pulse Width: 0.5 ms
Lead Channel Sensing Intrinsic Amplitude: 5 mV
Lead Channel Sensing Intrinsic Amplitude: 8.7 mV
Lead Channel Setting Pacing Amplitude: 2 V
Lead Channel Setting Pacing Amplitude: 2.5 V
Lead Channel Setting Pacing Pulse Width: 0.5 ms
Lead Channel Setting Sensing Sensitivity: 2 mV
Pulse Gen Model: 2272
Pulse Gen Serial Number: 7961269

## 2023-07-09 ENCOUNTER — Ambulatory Visit: Payer: Self-pay | Admitting: Internal Medicine

## 2023-08-07 DIAGNOSIS — I788 Other diseases of capillaries: Secondary | ICD-10-CM | POA: Diagnosis not present

## 2023-08-07 DIAGNOSIS — L821 Other seborrheic keratosis: Secondary | ICD-10-CM | POA: Diagnosis not present

## 2023-08-07 DIAGNOSIS — Z85828 Personal history of other malignant neoplasm of skin: Secondary | ICD-10-CM | POA: Diagnosis not present

## 2023-08-07 DIAGNOSIS — L309 Dermatitis, unspecified: Secondary | ICD-10-CM | POA: Diagnosis not present

## 2023-08-07 DIAGNOSIS — L720 Epidermal cyst: Secondary | ICD-10-CM | POA: Diagnosis not present

## 2023-08-07 DIAGNOSIS — L738 Other specified follicular disorders: Secondary | ICD-10-CM | POA: Diagnosis not present

## 2023-08-24 NOTE — Progress Notes (Signed)
 Remote pacemaker transmission.

## 2023-09-11 DIAGNOSIS — G40119 Localization-related (focal) (partial) symptomatic epilepsy and epileptic syndromes with simple partial seizures, intractable, without status epilepticus: Secondary | ICD-10-CM | POA: Diagnosis not present

## 2023-09-11 DIAGNOSIS — E7800 Pure hypercholesterolemia, unspecified: Secondary | ICD-10-CM | POA: Diagnosis not present

## 2023-09-11 DIAGNOSIS — Z0000 Encounter for general adult medical examination without abnormal findings: Secondary | ICD-10-CM | POA: Diagnosis not present

## 2023-09-11 DIAGNOSIS — Z79899 Other long term (current) drug therapy: Secondary | ICD-10-CM | POA: Diagnosis not present

## 2023-09-11 DIAGNOSIS — Z1331 Encounter for screening for depression: Secondary | ICD-10-CM | POA: Diagnosis not present

## 2023-10-04 ENCOUNTER — Ambulatory Visit (INDEPENDENT_AMBULATORY_CARE_PROVIDER_SITE_OTHER): Payer: Medicare HMO

## 2023-10-04 DIAGNOSIS — I495 Sick sinus syndrome: Secondary | ICD-10-CM

## 2023-10-05 LAB — CUP PACEART REMOTE DEVICE CHECK
Battery Remaining Longevity: 35 mo
Battery Remaining Percentage: 28 %
Battery Voltage: 2.95 V
Brady Statistic AP VP Percent: 9.7 %
Brady Statistic AP VS Percent: 11 %
Brady Statistic AS VP Percent: 5.6 %
Brady Statistic AS VS Percent: 74 %
Brady Statistic RA Percent Paced: 21 %
Brady Statistic RV Percent Paced: 15 %
Date Time Interrogation Session: 20250820020012
Implantable Lead Connection Status: 753985
Implantable Lead Connection Status: 753985
Implantable Lead Implant Date: 20171016
Implantable Lead Implant Date: 20171016
Implantable Lead Location: 753859
Implantable Lead Location: 753860
Implantable Pulse Generator Implant Date: 20171016
Lead Channel Impedance Value: 450 Ohm
Lead Channel Impedance Value: 530 Ohm
Lead Channel Pacing Threshold Amplitude: 0.5 V
Lead Channel Pacing Threshold Amplitude: 1.25 V
Lead Channel Pacing Threshold Pulse Width: 0.5 ms
Lead Channel Pacing Threshold Pulse Width: 0.5 ms
Lead Channel Sensing Intrinsic Amplitude: 10 mV
Lead Channel Sensing Intrinsic Amplitude: 5 mV
Lead Channel Setting Pacing Amplitude: 2 V
Lead Channel Setting Pacing Amplitude: 2.5 V
Lead Channel Setting Pacing Pulse Width: 0.5 ms
Lead Channel Setting Sensing Sensitivity: 2 mV
Pulse Gen Model: 2272
Pulse Gen Serial Number: 7961269

## 2023-10-08 ENCOUNTER — Ambulatory Visit: Payer: Self-pay | Admitting: Internal Medicine

## 2023-11-08 NOTE — Progress Notes (Signed)
 Remote PPM Transmission

## 2023-11-22 DIAGNOSIS — C61 Malignant neoplasm of prostate: Secondary | ICD-10-CM | POA: Diagnosis not present

## 2023-11-29 DIAGNOSIS — C61 Malignant neoplasm of prostate: Secondary | ICD-10-CM | POA: Diagnosis not present

## 2023-11-29 DIAGNOSIS — R399 Unspecified symptoms and signs involving the genitourinary system: Secondary | ICD-10-CM | POA: Diagnosis not present

## 2024-01-03 ENCOUNTER — Ambulatory Visit: Payer: Medicare HMO

## 2024-01-03 DIAGNOSIS — I495 Sick sinus syndrome: Secondary | ICD-10-CM | POA: Diagnosis not present

## 2024-01-04 ENCOUNTER — Ambulatory Visit: Payer: Self-pay | Admitting: Internal Medicine

## 2024-01-04 LAB — CUP PACEART REMOTE DEVICE CHECK
Battery Remaining Longevity: 32 mo
Battery Remaining Percentage: 25 %
Battery Voltage: 2.93 V
Brady Statistic AP VP Percent: 11 %
Brady Statistic AP VS Percent: 11 %
Brady Statistic AS VP Percent: 7.9 %
Brady Statistic AS VS Percent: 70 %
Brady Statistic RA Percent Paced: 21 %
Brady Statistic RV Percent Paced: 19 %
Date Time Interrogation Session: 20251119020013
Implantable Lead Connection Status: 753985
Implantable Lead Connection Status: 753985
Implantable Lead Implant Date: 20171016
Implantable Lead Implant Date: 20171016
Implantable Lead Location: 753859
Implantable Lead Location: 753860
Implantable Pulse Generator Implant Date: 20171016
Lead Channel Impedance Value: 460 Ohm
Lead Channel Impedance Value: 530 Ohm
Lead Channel Pacing Threshold Amplitude: 0.5 V
Lead Channel Pacing Threshold Amplitude: 1.25 V
Lead Channel Pacing Threshold Pulse Width: 0.5 ms
Lead Channel Pacing Threshold Pulse Width: 0.5 ms
Lead Channel Sensing Intrinsic Amplitude: 5 mV
Lead Channel Sensing Intrinsic Amplitude: 9.9 mV
Lead Channel Setting Pacing Amplitude: 2 V
Lead Channel Setting Pacing Amplitude: 2.5 V
Lead Channel Setting Pacing Pulse Width: 0.5 ms
Lead Channel Setting Sensing Sensitivity: 2 mV
Pulse Gen Model: 2272
Pulse Gen Serial Number: 7961269

## 2024-01-05 NOTE — Progress Notes (Signed)
 Remote PPM Transmission

## 2024-02-06 NOTE — Progress Notes (Signed)
" °  Electrophysiology Office Note:   Date:  02/07/2024  ID:  Anthony Banks, Anthony Banks [birth year], MRN [id number]  Primary Cardiologist: None Primary Heart Failure: None Electrophysiologist: Danelle Birmingham, MD       History of Present Illness:   Anthony Banks is a 73 y.o. male with h/o SND s/p PPM, HLD, SAH, prostate CA on XRT seen today for routine electrophysiology followup.   Since last being seen in our clinic the patient reports doing well. He has no device related concerns. Continues to play golf - rides in a cart. He is pending travel to visit with his family for the holiday.   He denies chest pain, palpitations, dyspnea, PND, orthopnea, nausea, vomiting, dizziness, syncope, edema, weight gain, or early satiety.   Review of systems complete and found to be negative unless listed in HPI.    EP Information / Studies Reviewed:    EKG is ordered today. Personal review as below.  EKG Interpretation Date/Time:  Wednesday February 07 2024 07:44:25 EST Ventricular Rate:  55 PR Interval:  336 QRS Duration:  94 QT Interval:  410 QTC Calculation: 392 R Axis:   -52  Text Interpretation: Sinus bradycardia with 1st degree A-V block Left axis deviation Confirmed by Aniceto Jarvis ([id number]) on 02/07/2024 7:52:52 AM   PPM Interrogation-  reviewed in detail today,  See PACEART report.  Device History: Abbott Dual Chamber PPM implanted 11/30/15 for Sinus Node Dysfunction  Risk Assessment/Calculations:              Physical Exam:   VS:  BP 112/78   Pulse (!) 55   Ht 6' 6 (1.981 m)   Wt 248 lb (112.5 kg)   SpO2 97%   BMI 28.66 kg/m    Wt Readings from Last 3 Encounters:  02/07/24 248 lb (112.5 kg)  02/17/23 253 lb (114.8 kg)  03/15/22 249 lb (112.9 kg)     GEN: Well nourished, well developed in no acute distress NECK: No JVD; No carotid bruits CARDIAC: Regular rate and rhythm, no murmurs, rubs, gallops, device wnl / no tethering  RESPIRATORY:  Clear to auscultation without rales,  wheezing or rhonchi  ABDOMEN: Soft, non-tender, non-distended EXTREMITIES:  No edema; No deformity   ASSESSMENT AND PLAN:    SND s/p Abbott PPM  -Normal PPM function -See Pace Art report -No changes today  Hypertension  -well controlled on current regimen     Disposition:   Follow up with Dr. Almetta in 12 months >transition to new EP MD reviewed with patient  Signed, Jarvis Aniceto, NP-C, AGACNP-BC  [facility] - Elect[facility]iology  02/07/2024, 7:53 AM  "

## 2024-02-07 ENCOUNTER — Ambulatory Visit: Admitting: Pulmonary Disease

## 2024-02-07 ENCOUNTER — Encounter: Payer: Self-pay | Admitting: Pulmonary Disease

## 2024-02-07 VITALS — BP 112/78 | HR 55 | Ht 78.0 in | Wt 248.0 lb

## 2024-02-07 DIAGNOSIS — Z950 Presence of cardiac pacemaker: Secondary | ICD-10-CM | POA: Diagnosis not present

## 2024-02-07 DIAGNOSIS — I495 Sick sinus syndrome: Secondary | ICD-10-CM

## 2024-02-07 LAB — CUP PACEART INCLINIC DEVICE CHECK
Battery Remaining Longevity: 32 mo
Battery Voltage: 2.93 V
Brady Statistic RA Percent Paced: 21 %
Brady Statistic RV Percent Paced: 20 %
Date Time Interrogation Session: 20251224094611
Implantable Lead Connection Status: 753985
Implantable Lead Connection Status: 753985
Implantable Lead Implant Date: 20171016
Implantable Lead Implant Date: 20171016
Implantable Lead Location: 753859
Implantable Lead Location: 753860
Implantable Pulse Generator Implant Date: 20171016
Lead Channel Impedance Value: 462.5 Ohm
Lead Channel Impedance Value: 525 Ohm
Lead Channel Pacing Threshold Amplitude: 0.5 V
Lead Channel Pacing Threshold Amplitude: 0.5 V
Lead Channel Pacing Threshold Amplitude: 0.75 V
Lead Channel Pacing Threshold Amplitude: 0.75 V
Lead Channel Pacing Threshold Pulse Width: 0.5 ms
Lead Channel Pacing Threshold Pulse Width: 0.5 ms
Lead Channel Pacing Threshold Pulse Width: 0.5 ms
Lead Channel Pacing Threshold Pulse Width: 0.5 ms
Lead Channel Sensing Intrinsic Amplitude: 5 mV
Lead Channel Sensing Intrinsic Amplitude: 9.7 mV
Lead Channel Setting Pacing Amplitude: 2 V
Lead Channel Setting Pacing Amplitude: 2.5 V
Lead Channel Setting Pacing Pulse Width: 0.5 ms
Lead Channel Setting Sensing Sensitivity: 2 mV
Pulse Gen Model: 2272
Pulse Gen Serial Number: 7961269

## 2024-02-07 NOTE — Patient Instructions (Signed)
 Medication Instructions:  Your physician recommends that you continue on your current medications as directed. Please refer to the Current Medication list given to you today.  *If you need a refill on your cardiac medications before your next appointment, please call your pharmacy*  Lab Work: None  If you have labs (blood work) drawn today and your tests are completely normal, you will receive your results only by: MyChart Message (if you have MyChart) OR A paper copy in the mail If you have any lab test that is abnormal or we need to change your treatment, we will call you to review the results.  Testing/Procedures: None   Follow-Up: At [facility], you and your health needs are our priority.  As part of our continuing mission to provide you with exceptional heart care, our providers are all part of one team.  This team includes your primary Cardiologist (physician) and Advanced Practice Providers or APPs (Physician Assistants and Nurse Practitioners) who all work together to provide you with the care you need, when you need it.  Your next appointment:   1 year(s)  Provider:   Donnice DELENA Primus, MD     We recommend signing up for the patient portal called MyChart.  Sign up information is provided on this After Visit Summary.  MyChart is used to connect with patients for Virtual Visits (Telemedicine).  Patients are able to view lab/test results, encounter notes, upcoming appointments, etc.  Non-urgent messages can be sent to your provider as well.   To learn more about what you can do with MyChart, go to [email or web address].   Other Instructions None

## 2024-02-09 ENCOUNTER — Ambulatory Visit: Payer: Self-pay | Admitting: Student in an Organized Health Care Education/Training Program
